# Patient Record
Sex: Male | Born: 1935 | Race: White | Hispanic: No | Marital: Married | Smoking: Never smoker
Health system: Southern US, Community
[De-identification: ages and names within clinical notes are randomized; demographics above are authoritative.]

## PROBLEM LIST (undated history)

## (undated) DIAGNOSIS — N200 Calculus of kidney: Secondary | ICD-10-CM

## (undated) DIAGNOSIS — Z9889 Other specified postprocedural states: Secondary | ICD-10-CM

## (undated) DIAGNOSIS — I10 Essential (primary) hypertension: Secondary | ICD-10-CM

## (undated) DIAGNOSIS — K219 Gastro-esophageal reflux disease without esophagitis: Secondary | ICD-10-CM

## (undated) DIAGNOSIS — E291 Testicular hypofunction: Secondary | ICD-10-CM

## (undated) DIAGNOSIS — R310 Gross hematuria: Secondary | ICD-10-CM

## (undated) DIAGNOSIS — C61 Malignant neoplasm of prostate: Secondary | ICD-10-CM

## (undated) DIAGNOSIS — R7303 Prediabetes: Secondary | ICD-10-CM

## (undated) DIAGNOSIS — J189 Pneumonia, unspecified organism: Secondary | ICD-10-CM

## (undated) DIAGNOSIS — R3129 Other microscopic hematuria: Secondary | ICD-10-CM

## (undated) DIAGNOSIS — Z8719 Personal history of other diseases of the digestive system: Secondary | ICD-10-CM

## (undated) DIAGNOSIS — R112 Nausea with vomiting, unspecified: Secondary | ICD-10-CM

## (undated) DIAGNOSIS — Z87442 Personal history of urinary calculi: Secondary | ICD-10-CM

## (undated) DIAGNOSIS — N400 Enlarged prostate without lower urinary tract symptoms: Secondary | ICD-10-CM

## (undated) DIAGNOSIS — R972 Elevated prostate specific antigen [PSA]: Secondary | ICD-10-CM

## (undated) DIAGNOSIS — N529 Male erectile dysfunction, unspecified: Secondary | ICD-10-CM

## (undated) HISTORY — DX: Testicular hypofunction: E29.1

## (undated) HISTORY — DX: Calculus of kidney: N20.0

## (undated) HISTORY — DX: Gross hematuria: R31.0

## (undated) HISTORY — PX: KIDNEY STONE SURGERY: SHX686

## (undated) HISTORY — DX: Malignant neoplasm of prostate: C61

## (undated) HISTORY — DX: Male erectile dysfunction, unspecified: N52.9

## (undated) HISTORY — DX: Elevated prostate specific antigen (PSA): R97.20

## (undated) HISTORY — DX: Benign prostatic hyperplasia without lower urinary tract symptoms: N40.0

## (undated) HISTORY — DX: Other microscopic hematuria: R31.29

## (undated) HISTORY — PX: PROSTATE SURGERY: SHX751

## (undated) HISTORY — DX: Gastro-esophageal reflux disease without esophagitis: K21.9

## (undated) HISTORY — PX: ORTHOPEDIC SURGERY: SHX850

## (undated) HISTORY — DX: Essential (primary) hypertension: I10

---

## 2003-12-20 HISTORY — PX: OTHER SURGICAL HISTORY: SHX169

## 2005-04-29 ENCOUNTER — Ambulatory Visit: Payer: Self-pay | Admitting: Gastroenterology

## 2006-12-11 ENCOUNTER — Ambulatory Visit: Payer: Self-pay | Admitting: Unknown Physician Specialty

## 2007-01-12 ENCOUNTER — Ambulatory Visit: Payer: Self-pay | Admitting: General Practice

## 2007-01-19 ENCOUNTER — Ambulatory Visit: Payer: Self-pay | Admitting: General Practice

## 2007-07-22 ENCOUNTER — Emergency Department: Payer: Self-pay

## 2007-08-10 ENCOUNTER — Ambulatory Visit: Payer: Self-pay | Admitting: Urology

## 2007-08-15 ENCOUNTER — Ambulatory Visit: Payer: Self-pay | Admitting: Urology

## 2007-08-22 ENCOUNTER — Ambulatory Visit: Payer: Self-pay | Admitting: Urology

## 2007-09-10 ENCOUNTER — Ambulatory Visit: Payer: Self-pay | Admitting: Urology

## 2007-09-13 ENCOUNTER — Ambulatory Visit: Payer: Self-pay | Admitting: Urology

## 2007-09-19 ENCOUNTER — Other Ambulatory Visit: Payer: Self-pay

## 2007-09-19 ENCOUNTER — Ambulatory Visit: Payer: Self-pay | Admitting: Urology

## 2007-09-20 ENCOUNTER — Ambulatory Visit: Payer: Self-pay | Admitting: Urology

## 2007-09-27 ENCOUNTER — Ambulatory Visit: Payer: Self-pay | Admitting: Urology

## 2007-10-04 ENCOUNTER — Ambulatory Visit: Payer: Self-pay | Admitting: Urology

## 2007-10-15 ENCOUNTER — Ambulatory Visit: Payer: Self-pay | Admitting: Urology

## 2007-10-24 ENCOUNTER — Ambulatory Visit: Payer: Self-pay | Admitting: Urology

## 2007-11-05 ENCOUNTER — Ambulatory Visit: Payer: Self-pay | Admitting: Urology

## 2008-03-05 ENCOUNTER — Ambulatory Visit: Payer: Self-pay | Admitting: Otolaryngology

## 2008-04-02 ENCOUNTER — Ambulatory Visit: Payer: Self-pay | Admitting: Urology

## 2012-09-06 ENCOUNTER — Ambulatory Visit: Payer: Self-pay | Admitting: Urology

## 2012-10-23 ENCOUNTER — Ambulatory Visit: Payer: Self-pay | Admitting: Urology

## 2013-01-01 ENCOUNTER — Ambulatory Visit: Payer: Self-pay | Admitting: Urology

## 2013-01-07 ENCOUNTER — Ambulatory Visit: Payer: Self-pay | Admitting: Urology

## 2014-02-19 ENCOUNTER — Ambulatory Visit: Payer: Self-pay | Admitting: Urology

## 2014-09-22 ENCOUNTER — Ambulatory Visit: Payer: Self-pay | Admitting: Urology

## 2014-10-02 DIAGNOSIS — I10 Essential (primary) hypertension: Secondary | ICD-10-CM | POA: Insufficient documentation

## 2014-10-02 DIAGNOSIS — E785 Hyperlipidemia, unspecified: Secondary | ICD-10-CM | POA: Insufficient documentation

## 2014-10-02 HISTORY — DX: Essential (primary) hypertension: I10

## 2014-11-20 ENCOUNTER — Ambulatory Visit: Payer: Self-pay | Admitting: Radiation Oncology

## 2014-12-17 LAB — PSA: PSA: 5.1 ng/mL — AB (ref 0.0–4.0)

## 2014-12-19 ENCOUNTER — Ambulatory Visit: Payer: Self-pay | Admitting: Radiation Oncology

## 2014-12-19 ENCOUNTER — Ambulatory Visit: Payer: Self-pay | Admitting: Oncology

## 2014-12-19 DIAGNOSIS — N200 Calculus of kidney: Secondary | ICD-10-CM

## 2014-12-19 HISTORY — DX: Calculus of kidney: N20.0

## 2014-12-19 HISTORY — PX: OTHER SURGICAL HISTORY: SHX169

## 2015-01-19 ENCOUNTER — Ambulatory Visit: Payer: Self-pay | Admitting: Radiation Oncology

## 2015-01-19 LAB — HEMOGLOBIN: HGB: 12.7 g/dL — ABNORMAL LOW (ref 13.0–18.0)

## 2015-01-26 ENCOUNTER — Ambulatory Visit: Payer: Self-pay | Admitting: Urology

## 2015-01-30 ENCOUNTER — Ambulatory Visit: Payer: Self-pay | Admitting: Radiation Oncology

## 2015-02-17 ENCOUNTER — Ambulatory Visit
Admit: 2015-02-17 | Disposition: A | Discharge status: Home / Self Care | Payer: Self-pay | Attending: Radiation Oncology | Admitting: Radiation Oncology

## 2015-03-16 LAB — CBC CANCER CENTER
Basophil #: 0 x10 3/mm (ref 0.0–0.1)
Basophil %: 0.8 %
EOS PCT: 5.8 %
Eosinophil #: 0.3 x10 3/mm (ref 0.0–0.7)
HCT: 33.7 % — ABNORMAL LOW (ref 40.0–52.0)
HGB: 12.2 g/dL — ABNORMAL LOW (ref 13.0–18.0)
Lymphocyte #: 1.2 x10 3/mm (ref 1.0–3.6)
Lymphocyte %: 28.3 %
MCH: 31.1 pg (ref 26.0–34.0)
MCHC: 36.2 g/dL — ABNORMAL HIGH (ref 32.0–36.0)
MCV: 86 fL (ref 80–100)
MONOS PCT: 8.2 %
Monocyte #: 0.4 x10 3/mm (ref 0.2–1.0)
Neutrophil #: 2.4 x10 3/mm (ref 1.4–6.5)
Neutrophil %: 56.9 %
Platelet: 199 x10 3/mm (ref 150–440)
RBC: 3.91 10*6/uL — ABNORMAL LOW (ref 4.40–5.90)
RDW: 12.6 % (ref 11.5–14.5)
WBC: 4.3 x10 3/mm (ref 3.8–10.6)

## 2015-03-16 LAB — COMPREHENSIVE METABOLIC PANEL
ALBUMIN: 4 g/dL
ANION GAP: 7 (ref 7–16)
Alkaline Phosphatase: 77 U/L
BUN: 28 mg/dL — AB
Bilirubin,Total: 0.5 mg/dL
CALCIUM: 8.9 mg/dL
CHLORIDE: 105 mmol/L
Co2: 23 mmol/L
Creatinine: 1.21 mg/dL
EGFR (African American): >60
EGFR (Non-African Amer.): 58 — ABNORMAL LOW
Glucose: 184 mg/dL — ABNORMAL HIGH
Potassium: 4 mmol/L
SGOT(AST): 22 U/L
SGPT (ALT): 52 U/L
Sodium: 135 mmol/L
TOTAL PROTEIN: 6.6 g/dL

## 2015-03-17 LAB — PSA: PSA: 0.2 ng/mL (ref 0.0–4.0)

## 2015-03-20 ENCOUNTER — Ambulatory Visit
Admit: 2015-03-20 | Disposition: A | Discharge status: Home / Self Care | Payer: Self-pay | Attending: Radiation Oncology | Admitting: Radiation Oncology

## 2015-04-10 DIAGNOSIS — D649 Anemia, unspecified: Secondary | ICD-10-CM | POA: Insufficient documentation

## 2015-04-10 DIAGNOSIS — R739 Hyperglycemia, unspecified: Secondary | ICD-10-CM | POA: Insufficient documentation

## 2015-04-10 DIAGNOSIS — A809 Acute poliomyelitis, unspecified: Secondary | ICD-10-CM | POA: Insufficient documentation

## 2015-04-10 NOTE — Op Note (Signed)
PATIENT NAME:  Alex Lang, Alex Lang MR#:  [id number] DATE OF BIRTH:  August 19, 1936  DATE OF PROCEDURE:  01/07/2013  SURGEON:  Rick Duff, MD  PREOPERATIVE DIAGNOSIS:  Painless gross hematuria.  POSTOPERATIVE DIAGNOSIS:  Painless gross hematuria, secondary to inflammatory changes of the bladder neck and prostatic urethra with no bladder tumor seen.  PROCEDURE:  Cystourethroscopy.  ANESTHESIA:  General.  DESCRIPTION OF PROCEDURE:  With the patient sterilely prepped and draped in the supine lithotomy position, (Dictation Anomaly) for ease of approach to t the external genitalia, I do a cystoscopy utilizing a 22-French sheath with a 30 degree lens.  The urethra and bladder and prostate are seen easily.  No tumors, masses or growths are seen in the bladder itself.  I view the bladder with both the 30 degree lens and the 90 degree lens.  The prostatic fossa has some inflammatory changes that are not tumorous in nature.  The prostate is slightly enlarged with lateral lobe hypertrophy and slight bladder neck hypertrophy.  The prostate itself does not appear to be obstructed in nature, nor is the bladder neck. There is slight trabeculation in the bladder with no (Dictation Anomaly) diverticuli diverticula seen.  The trabeculation is about a grade II/IV trabeculation.  The ureter is in a normal position with a clear efflux of urine coming from each orifice.  No calculi or tumors or masses are seen in the bladder itself.  The only area that could have caused the bleeding is right before the bladder neck in the prostatic urethra and fossa itself.  Therefore, no resection or biopsy is done, as I saw no masses that were worthy of biopsy.  At the end of the procedure, the bladder is emptied and 30 mL of 0.5% Sensorcaine without epinephrine is placed into the bladder.  The sheath is then withdrawn.  Beano suppository is done.  On rectal exam, a small prostate, nonsuspicious in nature, is felt without nodule, mass or  growth.  The patient is sent to recovery in satisfactory condition.   ____________________________ Janice Coffin. Elnoria Howard, DO rdh:dm D: 01/07/2013 09:35:22 ET T: 01/07/2013 09:38:31 ET JOB#: [id number]  cc: Janice Coffin. Elnoria Howard, DO, <Dictator> Samyria Rudie D Enriqueta Augusta DO ELECTRONICALLY SIGNED 02/08/2013 15:41

## 2015-04-11 NOTE — Consult Note (Signed)
Reason for Visit: This 79 year old Male patient presents to the clinic for initial evaluation of  prostate cancer .   Referred by Dr. Erlene Quan.  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  Pathology Report pathology report reviewed   Imaging Report bone scan not indicat   Referral Report clinical notes reviewed   Planned Treatment Regimen I-125 interstitial implant   HPI   patient is a 79 year old male with history of hypogonadism previously on testosterone therapy noted to have a rising PSA September 2015 up to 4.8 which had risen over the past 6 months from 3.5. He underwent transrectal ultrasound-guided biopsy showing mostly Gleason 6 (3+3) confined to the left lateral lobe. One core biopsy from the right lobe was also positive. There was also one core positive for Gleason 7 (3+4). Patient on digital rectal exam did have some induration of the left lateral lobe consistent with a T2A lesion.patient has been stopped from his testosterone therapy. Does have baseline erectile dysfunction but uses Viagra. He denies frequency and urgency of urination. No bowel problems. 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  Past Hx:    polio:    kidney stones:    right leg surgery:    left knee surgery:   Past, Family and Social History:  Past Medical History positive   Genitourinary kidney stones; BPH, erectile dysfunction   Past Surgical History bilateral knee surgeries   Past Medical History Comments childhood polio   Family History noncontributory   Social History noncontributory   Additional Past Medical and Surgical History accompanied by his wife today   Allergies:   Darvon-N: N/V, Other  Darvocet - N: N/V  Home Meds:  Home Medications: Medication Instructions Status  Vitamin D3 2000 intl  units oral capsule 1 cap(s) orally once a day (in the morning) Active  Centrum Silver 1 cap(s) orally once a day (in the morning) Active  Fish Oil 1200 mg oral capsule 1 cap(s) orally once a day (in the morning) Active  ibuprofen 200 mg oral capsule 1 cap(s) orally every 4 hours, As Needed Active  Percocet 5/325 oral tablet 1 tab(s) orally every 6 hours, As Needed- for Pain  Active  Llysine 500mg  1 tab(s) orally 2 times a day Active  omeprazole 20 mg oral delayed release capsule 1 cap(s) orally 2 times a day Active  potassium citrate 10 mEq oral tablet, extended release 1 tab(s) orally 3 times a day Active  benazepril 5 mg oral tablet 1 tab(s) orally 2 times a day Active   Review of Systems:  General negative   Performance Status (ECOG) 0   Skin negative   Breast negative   Ophthalmologic negative   ENMT negative   Respiratory and Thorax negative   Cardiovascular negative   Gastrointestinal negative   Genitourinary see HPI   Musculoskeletal see HPI   Neurological negative   Psychiatric negative   Hematology/Lymphatics negative   Endocrine negative   Allergic/Immunologic negative   Review of Systems   denies any weight loss, fatigue, weakness, fever, chills or night sweats. Patient denies any loss of vision, blurred vision. Patient denies any ringing  of the ears or hearing loss. No irregular heartbeat. Patient denies heart murmur or history of fainting. Patient denies any chest pain or pain radiating to her upper extremities. Patient denies any  shortness of breath, difficulty breathing at night, cough or hemoptysis. Patient denies any swelling in the lower legs. Patient denies any nausea vomiting, vomiting of blood, or coffee ground material in the vomitus. Patient denies any stomach pain. Patient states has had normal bowel movements no significant constipation or diarrhea. Patient denies any dysuria, hematuria or significant nocturia. Patient denies any problems  walking, swelling in the joints or loss of balance. Patient denies any skin changes, loss of hair or loss of weight. Patient denies any excessive worrying or anxiety or significant depression. Patient denies any problems with insomnia. Patient denies excessive thirst, polyuria, polydipsia. Patient denies any swollen glands, patient denies easy bruising or easy bleeding. Patient denies any recent infections, allergies or URI. Patient "s visual fields have not changed significantly in recent time.   Nursing Notes:  Nursing Vital Signs and Chemo Nursing Nursing Notes: *CC Vital Signs Flowsheet:   03-Dec-15 11:37  Temp Temperature 97.5  Pulse Pulse 56  SBP SBP 146  DBP DBP 68  Current Weight (kg) (kg) 88.7  Height (cm) centimeters 168  BSA (m2) 1.9   Physical Exam:  General/Skin/HEENT:  General normal   Skin normal   Eyes normal   ENMT normal   Head and Neck normal   Additional PE well-developed male in NAD. He does wear bilateral leg braces secondary to his childhood polio. Lungs are clear to A&P cardiac examination shows regular rate and rhythm abdomen is benign. On rectal exam rectal sphincter tone is good there is induration in the superior part of the left lateral lobe of the prostate. Sulcus is preserved. No other rectal abnormali   Breasts/Resp/CV/GI/GU:  Respiratory and Thorax normal   Cardiovascular normal   Gastrointestinal normal   Genitourinary normal   MS/Neuro/Psych/Lymph:  Lymphatics normal   Physical Exam patient has neurologic sequela are early childhood polio   Assessment and Plan: Impression:   stage II a Gleason 6 adenocarcinoma prostate presenting with a PSA of 12.24 and 79 year old male. Plan:   the stomach on over treatment options with the patient. Patient is favoring radiation therapy and I discussed both external beam as well as I-125 interstitial implant. Patient would be a good candidate for implant and I discussed the risks and benefits of  treatment including volume study as well as the operative procedure itself. Risks and benefits including being radioactive for about 2 months, increased urinary urgency and frequency and nocturia during that treatment time, possible diarrhea possible fatigue and risks and benefits associated with general anesthesia. Patient is wife seem to copying my treatment plan well and would like [place] to proceed with scheduling volume study as well as seed implant. I will plan on delivering 145 gray to his prostatic volume based on the volume study.  I would like to take this opportunity for allowing me to participate in the care of your patient..  Fax to Physician:  Physicians To Recieve Fax: Madelyn Brunner, MD - [phone] Sherlynn Stalls - [phone].  Electronic Signatures: Maritssa Haughton, Roda Shutters (MD)  (Signed 03-Dec-15 14:07)  Authored: HPI, Diagnosis, Past Hx, PFSH, Allergies, Home Meds, ROS, Nursing Notes, Physical Exam, Encounter Assessment and Plan, Fax to Physician   Last Updated: 03-Dec-15 14:07 by Armstead Peaks (MD)

## 2015-04-19 NOTE — Op Note (Signed)
PATIENT NAME:  Alex Lang, Alex Lang MR#:  [id number] DATE OF BIRTH:  Nov 05, 1936  DATE OF PROCEDURE:  01/26/2015  PREOPERATIVE DIAGNOSIS: Prostate cancer.   POSTOPERATIVE DIAGNOSIS: Prostate cancer.   PROCEDURE PERFORMED: Implantation of I-125 prostatic brachytherapy seeds, cystoscopy.   ATTENDING SURGEON: Sherlynn Stalls, MD  ASSISTANT: Noreene Filbert, MD   ANESTHESIA: General.  ESTIMATED BLOOD LOSS: 25 mL.   DRAINS: None.   COMPLICATIONS: None.   SPECIMENS: None.   INDICATION: This is a 79 year old male who was diagnosed with Gleason 3 + 3 and 3 + 4 prostate cancer. His PSA was elevated to 4.8 and there was a nodule palpable, stage T2 disease. He was counseled on his various treatment options and has elected to undergo implantation of brachytherapy seeds. Previous volume study had been performed and he presents today for implantation of brachytherapy seeds. Risks and benefits of the procedure were explained in detail. The patient agreed to proceed as planned.   DESCRIPTION OF PROCEDURE: The patient was correctly identified in the preoperative holding area and informed consent was confirmed. He was brought to the operating suite and placed on the table in the supine position. At this time, universal timeout protocol was performed. All team members were identified. Venodyne boots were placed, and the patient was administered 1 gram of IV Ancef in the perioperative period. He was then placed under general anesthesia and repositioned in the dorsal lithotomy position. His perineum was shaved and his scrotum was retracted using a sticky drape. He was then prepped and draped in the standard surgical fashion. The brachytherapy grid was then brought in after a 16-French Foley catheter was placed sterilely without difficulty into the bladder and the bladder was drained. At this point in time, the prostate could be visualized using the rectal ultrasound probe. Using the predetermined needle  locations, a total of  65 brachytherapy seeds were placed using 25 needles in various locations. Both transverse and sagittal images were used to insure that none of the seeds were not placed within the prostatic urethra or within the bladder. Once all the seeds had been placed in their optimal position, the Foley catheter was removed and the patient was scoped using a 22-French rigid cystoscope. The prostatic urethra appeared normal and there was no evidence of brachytherapy seeds within the prostatic urethra. The bladder was then surveyed and there was no evidence of bladder bleeding or seeds within the bladder. The bladder was then drained. An x-ray was then obtained and all the brachytherapy seeds were accounted for within the prostatic fossa. The patient was then repositioned in the supine position, reversed from anesthesia and taken to the PACU in stable condition. He was able to void spontaneously prior to discharge without any difficulty.   PLAN: I will see the patient back in 1 month for follow-up.  ____________________________ Sherlynn Stalls, MD ajb:sb D: 01/26/2015 09:39:15 ET T: 01/26/2015 09:49:22 ET JOB#: [id number]  cc: Sherlynn Stalls, MD, <Dictator> Sherlynn Stalls MD ELECTRONICALLY SIGNED 01/26/2015 17:44

## 2015-04-19 NOTE — Op Note (Signed)
PATIENT NAME:  Alex Lang, Alex Lang MR#:  [id number] DATE OF BIRTH:  08/30/1936  DATE OF PROCEDURE:  12/29/2014  PREOPERATIVE DIAGNOSIS: Prostate cancer.   POSTOPERATIVE DIAGNOSIS: Prostate cancer.  PROCEDURE PERFORMED: Volume study for planned brachytherapy.  ATTENDING SURGEON: Hollice Espy, M.D.   RADIATION ONCOLOGIST: Noreene Filbert, MD   ANESTHESIA: None.  DRAINS: None.   COMPLICATIONS: None.   INDICATION: This is a 79 year old male who has been recently diagnosed with prostate cancer and has elected to undergo placement of brachytherapy seeds for management of his localized disease. He presents today for A volume study.   DESCRIPTION OF PROCEDURE: The patient was correctly identified and brought to the operating room. He was placed on the table in the supine position. His identity was reconfirmed. At this point in time, under sterile technique, I placed a 16-French Foley catheter without difficulty with drainage of clear yellow urine. A rectal ultrasound probe was then placed and serial cross-sectional images, approximately 5 mm in each slice, were obtained and the borders of the prostate were visualized and outlined with each sequential frame. These were all seen through the computer system as well as printed for planning purposes. The rectal probe was then removed and tolerated the procedure quite well. The Foley catheter was then removed, and the patient was transferred back to the stretcher and taken to the [place].  ____________________________ Sherlynn Stalls, MD ajb:sb D: 12/29/2014 10:21:00 ET T: 12/29/2014 10:40:40 ET JOB#: [id number]  cc: Sherlynn Stalls, MD, <Dictator> Sherlynn Stalls MD ELECTRONICALLY SIGNED 12/31/2014 15:53

## 2015-05-27 ENCOUNTER — Other Ambulatory Visit: Payer: Self-pay | Admitting: Orthopedic Surgery

## 2015-05-27 DIAGNOSIS — R2231 Localized swelling, mass and lump, right upper limb: Secondary | ICD-10-CM

## 2015-06-05 ENCOUNTER — Ambulatory Visit
Admission: RE | Admit: 2015-06-05 | Discharge: 2015-06-05 | Disposition: A | Discharge status: Home / Self Care | Payer: Medicare Other | Source: Ambulatory Visit | Attending: Orthopedic Surgery | Admitting: Orthopedic Surgery

## 2015-06-05 DIAGNOSIS — R2231 Localized swelling, mass and lump, right upper limb: Secondary | ICD-10-CM | POA: Diagnosis present

## 2015-06-18 ENCOUNTER — Ambulatory Visit (INDEPENDENT_AMBULATORY_CARE_PROVIDER_SITE_OTHER): Payer: Medicare Other | Admitting: Urology

## 2015-06-18 ENCOUNTER — Ambulatory Visit
Admission: RE | Admit: 2015-06-18 | Discharge: 2015-06-18 | Disposition: A | Discharge status: Home / Self Care | Payer: Medicare Other | Source: Ambulatory Visit | Attending: Urology | Admitting: Urology

## 2015-06-18 ENCOUNTER — Encounter: Payer: Self-pay | Admitting: Urology

## 2015-06-18 VITALS — BP 168/71 | HR 53 | Ht 69.0 in | Wt 199.5 lb

## 2015-06-18 DIAGNOSIS — C61 Malignant neoplasm of prostate: Secondary | ICD-10-CM

## 2015-06-18 DIAGNOSIS — N200 Calculus of kidney: Secondary | ICD-10-CM

## 2015-06-18 DIAGNOSIS — R109 Unspecified abdominal pain: Secondary | ICD-10-CM | POA: Diagnosis not present

## 2015-06-18 NOTE — Progress Notes (Signed)
06/18/2015 2:45 PM   Alex Lang 1936/10/05 [id number]  Referring provider: No referring provider defined for this encounter.  Chief Complaint  Patient presents with  . Flank Pain    HPI: Alex Lang is a 80 year old white male with a history of nephrolithiasis who has been experiencing right flank pain for the last four days.  He states the pain has been quite constant since its onset. He describes it as a dull ache. His wife states he has been quite irritable and that is out of character for him. The pain is independent of movement or time of day. He does not complain of fevers, chills, nausea, vomiting or blood in the urine.    A CT scan performed in 2013 noted bilateral nephrolithiasis.  Patient also has  cT2a Prostate cancer- Dx in 10/2014 with Gleason 3+4, 3+3 prostate cancer involving 8 of 13 cores, iPSA 4.8 DRE also somewhat concerning for cancer with questionable induration at right base. TRUS vol 45 cc.  He underwent I-125 brachytherapy seed placement on 01/26/2015 by Dr. Hollice Espy. He is also receiving adjunctive ADT therapy for 4-6 months.  He is currently being followed by Dr. Donella Stade and will see seeing him soon for an office visit where they will obtain a PSA.  PMH: Past Medical History  Diagnosis Date  . Kidney stone   . Prostate cancer   . Hypertension   . Acid reflux     Surgical History: Past Surgical History  Procedure Laterality Date  . Prostate surgery    . Kidney stone surgery      Home Medications:    Medication List       This list is accurate as of: 06/18/15  2:45 PM.  Always use your most recent med list.               benazepril 5 MG tablet  Commonly known as:  LOTENSIN  Take by mouth.     CALCIUM 600 600 MG Tabs tablet  Generic drug:  calcium carbonate  Take by mouth.     FISH OIL + D3 PO  Take by mouth.     Ginkgo Biloba 40 MG Tabs  Take by mouth.     Lysine 500 MG Caps  Take by mouth.     omeprazole 20  MG capsule  Commonly known as:  PRILOSEC  Take by mouth.     tamsulosin 0.4 MG Caps capsule  Commonly known as:  FLOMAX  Take by mouth.     Vitamin D3 2000 UNITS capsule  Take by mouth.        Allergies:  Allergies  Allergen Reactions  . Other Nausea And Vomiting    Family History: Family History  Problem Relation Age of Onset  . Hypertension Mother   . Cancer Maternal Grandfather   . Cancer Brother   . Prostate cancer Brother     Social History:  reports that he has never smoked. He does not have any smokeless tobacco history on file. He reports that he does not drink alcohol or use illicit drugs.  ROS: Urological Symptom Review  Patient is experiencing the following symptoms: Flank pain   Review of Systems  Gastrointestinal (upper)  : Negative for upper GI symptoms  Gastrointestinal (lower) : Negative for lower GI symptoms  Constitutional : Negative for symptoms  Skin: Negative for skin symptoms  Eyes: Negative for eye symptoms  Ear/Nose/Throat : Negative for Ear/Nose/Throat symptoms  Hematologic/Lymphatic: Negative for Hematologic/Lymphatic  symptoms  Cardiovascular : Negative for cardiovascular symptoms  Respiratory : Negative for respiratory symptoms  Endocrine: Negative for endocrine symptoms  Musculoskeletal: Negative for musculoskeletal symptoms  Neurological: Negative for neurological symptoms  Psychologic: Negative for psychiatric symptoms   Physical Exam: BP 168/71 mmHg  Pulse 53  Ht 5\' 9"  (1.753 m)  Wt 199 lb 8 oz (90.493 kg)  BMI 29.45 kg/m2  Constitutional:  Alert and oriented, No acute distress. HEENT: [place] AT, moist mucus membranes.  Trachea midline, no masses. Cardiovascular: No clubbing, cyanosis, or edema. Respiratory: Normal respiratory effort, no increased work of breathing. GI: Abdomen is soft, nontender, nondistended, no abdominal masses GU: Right CVA tenderness.  Skin: No rashes, bruises or suspicious  lesions. Lymph: No cervical or inguinal adenopathy. Neurologic: Grossly intact, no focal deficits, moving all 4 extremities. Psychiatric: Normal mood and affect.  Laboratory Data: Results for orders placed or performed in visit on 06/18/15  Microscopic Examination  Result Value Ref Range   WBC, UA 0-5 0 -  5 /hpf   RBC, UA 11-30 (A) 0 -  2 /hpf   Epithelial Cells (non renal) 0-10 0 - 10 /hpf   Bacteria, UA None seen None seen/Few  Urinalysis, Complete  Result Value Ref Range   Specific Gravity, UA 1.025 1.005 - 1.030   pH, UA 5.0 5.0 - 7.5   Color, UA Yellow Yellow   Appearance Ur Clear Clear   Leukocytes, UA Negative Negative   Protein, UA Negative Negative/Trace   Glucose, UA Negative Negative   Ketones, UA Negative Negative   RBC, UA 3+ (A) Negative   Bilirubin, UA Negative Negative   Urobilinogen, Ur 0.2 0.2 - 1.0 mg/dL   Nitrite, UA Negative Negative   Microscopic Examination See below:    Lab Results  Component Value Date   WBC 4.3 03/16/2015   HGB 12.2* 03/16/2015   HCT 33.7* 03/16/2015   MCV 86 03/16/2015   PLT 199 03/16/2015    Lab Results  Component Value Date   CREATININE 1.21 03/16/2015    Lab Results  Component Value Date   PSA 0.2 03/16/2015   PSA 5.1* 12/15/2014    No results found for: TESTOSTERONE  No results found for: HGBA1C  Urinalysis No results found for: COLORURINE, APPEARANCEUR, LABSPEC, PHURINE, GLUCOSEU, HGBUR, BILIRUBINUR, KETONESUR, PROTEINUR, UROBILINOGEN, NITRITE, LEUKOCYTESUR  Pertinent Imaging: CLINICAL DATA: Right back pain, history of renal calculi  EXAM: ABDOMEN - 1 VIEW  COMPARISON: 09/23/2015  FINDINGS: Bilateral renal calculi are reidentified. Normal bowel-gas pattern. No free air. Prostatic brachytherapy seeds. Moderate lumbar spine disc degenerative change and rightward curvature centered at L3 reidentified.  IMPRESSION: No significant change in bilateral renal calculi.   Electronically Signed   By: Conchita Paris M.D.  On: 06/18/2015 16:21  Assessment & Plan:    1. Kidney stones:  Patient has known bilateral nephrolithiasis recommended by CT.  He has not passed any fragments.  We'll obtain a KUB to evaluate the current status of the bilateral nephrolithiasis.    - Urinalysis, Complete  2. Right flank pain:   Patient his been experiencing right-sided flank pain for the past 4 days. Nonobstructing stones are usually not responsible for flank pain. We will obtain a KUB to ascertain whether or not the right-sided stone has moved into the ureter.  KUB did not demonstrate an ureteral stone.  We will obtain a non contrast CT for further evaluation of his flank pain.    3. Prostate cancer:   cT2a Prostate cancer- Dx  in 10/2014 with Gleason 3+4, 3+3 prostate cancer involving 8 of 13 cores, iPSA 4.8 DRE also somewhat concerning for cancer with questionable induration at right base. TRUS vol 45 cc.  He underwent I-125 brachytherapy seed placement on 01/26/2015 by Dr. Hollice Espy. He is also receiving adjunctive ADT therapy for 4-6 months.  He is currently being followed by Cancer center and will see seeing him in July for an office visit where they will obtain a PSA.    No Follow-up on file. ; [facility], [facility] [address] [place], [place] [address] [phone]

## 2015-06-19 ENCOUNTER — Other Ambulatory Visit: Payer: Self-pay | Admitting: *Deleted

## 2015-06-19 DIAGNOSIS — C61 Malignant neoplasm of prostate: Secondary | ICD-10-CM

## 2015-06-19 LAB — MICROSCOPIC EXAMINATION: Bacteria, UA: NONE SEEN

## 2015-06-19 LAB — URINALYSIS, COMPLETE
BILIRUBIN UA: NEGATIVE
GLUCOSE, UA: NEGATIVE
KETONES UA: NEGATIVE
Leukocytes, UA: NEGATIVE
Nitrite, UA: NEGATIVE
PROTEIN UA: NEGATIVE
Specific Gravity, UA: 1.025 (ref 1.005–1.030)
Urobilinogen, Ur: 0.2 mg/dL (ref 0.2–1.0)
pH, UA: 5 (ref 5.0–7.5)

## 2015-06-21 DIAGNOSIS — N200 Calculus of kidney: Secondary | ICD-10-CM

## 2015-06-21 DIAGNOSIS — R109 Unspecified abdominal pain: Secondary | ICD-10-CM | POA: Insufficient documentation

## 2015-06-21 DIAGNOSIS — C61 Malignant neoplasm of prostate: Secondary | ICD-10-CM | POA: Insufficient documentation

## 2015-06-21 HISTORY — DX: Calculus of kidney: N20.0

## 2015-06-23 ENCOUNTER — Inpatient Hospital Stay (HOSPITAL_BASED_OUTPATIENT_CLINIC_OR_DEPARTMENT_OTHER): Payer: Medicare Other | Admitting: Oncology

## 2015-06-23 ENCOUNTER — Encounter: Payer: Self-pay | Admitting: Oncology

## 2015-06-23 ENCOUNTER — Inpatient Hospital Stay: Payer: Medicare Other | Attending: Oncology

## 2015-06-23 VITALS — BP 175/88 | HR 51 | Temp 97.2°F | Wt 207.2 lb

## 2015-06-23 DIAGNOSIS — Z87442 Personal history of urinary calculi: Secondary | ICD-10-CM | POA: Diagnosis not present

## 2015-06-23 DIAGNOSIS — K219 Gastro-esophageal reflux disease without esophagitis: Secondary | ICD-10-CM | POA: Insufficient documentation

## 2015-06-23 DIAGNOSIS — R5383 Other fatigue: Secondary | ICD-10-CM | POA: Insufficient documentation

## 2015-06-23 DIAGNOSIS — M7591 Shoulder lesion, unspecified, right shoulder: Secondary | ICD-10-CM | POA: Diagnosis not present

## 2015-06-23 DIAGNOSIS — I10 Essential (primary) hypertension: Secondary | ICD-10-CM

## 2015-06-23 DIAGNOSIS — Z923 Personal history of irradiation: Secondary | ICD-10-CM

## 2015-06-23 DIAGNOSIS — M5136 Other intervertebral disc degeneration, lumbar region: Secondary | ICD-10-CM | POA: Diagnosis not present

## 2015-06-23 DIAGNOSIS — M1990 Unspecified osteoarthritis, unspecified site: Secondary | ICD-10-CM

## 2015-06-23 DIAGNOSIS — R531 Weakness: Secondary | ICD-10-CM | POA: Diagnosis not present

## 2015-06-23 DIAGNOSIS — Z79899 Other long term (current) drug therapy: Secondary | ICD-10-CM | POA: Diagnosis not present

## 2015-06-23 DIAGNOSIS — C61 Malignant neoplasm of prostate: Secondary | ICD-10-CM

## 2015-06-23 DIAGNOSIS — M25511 Pain in right shoulder: Secondary | ICD-10-CM

## 2015-06-23 LAB — CBC WITH DIFFERENTIAL/PLATELET
BASOS PCT: 1 %
Basophils Absolute: 0 10*3/uL (ref 0–0.1)
EOS ABS: 0.2 10*3/uL (ref 0–0.7)
EOS PCT: 7 %
HCT: 33.8 % — ABNORMAL LOW (ref 40.0–52.0)
Hemoglobin: 11.8 g/dL — ABNORMAL LOW (ref 13.0–18.0)
Lymphocytes Relative: 26 %
Lymphs Abs: 0.9 10*3/uL — ABNORMAL LOW (ref 1.0–3.6)
MCH: 30.1 pg (ref 26.0–34.0)
MCHC: 35 g/dL (ref 32.0–36.0)
MCV: 85.8 fL (ref 80.0–100.0)
MONO ABS: 0.4 10*3/uL (ref 0.2–1.0)
MONOS PCT: 12 %
Neutro Abs: 2 10*3/uL (ref 1.4–6.5)
Neutrophils Relative %: 54 %
Platelets: 173 10*3/uL (ref 150–440)
RBC: 3.93 MIL/uL — ABNORMAL LOW (ref 4.40–5.90)
RDW: 12.7 % (ref 11.5–14.5)
WBC: 3.6 10*3/uL — ABNORMAL LOW (ref 3.8–10.6)

## 2015-06-23 LAB — COMPREHENSIVE METABOLIC PANEL
ALK PHOS: 70 U/L (ref 38–126)
ALT: 55 U/L (ref 17–63)
AST: 22 U/L (ref 15–41)
Albumin: 4 g/dL (ref 3.5–5.0)
Anion gap: 5 (ref 5–15)
BUN: 29 mg/dL — ABNORMAL HIGH (ref 6–20)
CO2: 26 mmol/L (ref 22–32)
Calcium: 8.7 mg/dL — ABNORMAL LOW (ref 8.9–10.3)
Chloride: 103 mmol/L (ref 101–111)
Creatinine, Ser: 1.25 mg/dL — ABNORMAL HIGH (ref 0.61–1.24)
GFR, EST NON AFRICAN AMERICAN: 54 mL/min — AB (ref >60)
GLUCOSE: 132 mg/dL — AB (ref 65–99)
POTASSIUM: 4.8 mmol/L (ref 3.5–5.1)
SODIUM: 134 mmol/L — AB (ref 135–145)
Total Bilirubin: 0.6 mg/dL (ref 0.3–1.2)
Total Protein: 6.3 g/dL — ABNORMAL LOW (ref 6.5–8.1)

## 2015-06-23 LAB — PSA: PSA: 0.04 ng/mL (ref 0.00–4.00)

## 2015-06-23 NOTE — Progress Notes (Signed)
Patient does have living will.  Never smoked. Patient c/o hot flashes after initial Lupron injection.

## 2015-06-23 NOTE — Progress Notes (Signed)
[place] @ [facility] Telephone:([phone]  Fax:(336) [place]: [birth year]  MR#: [id number]  OFB#:[id number]  Patient Care Team: Provider Not In System as PCP - General  CHIEF COMPLAINT:  Chief Complaint  Patient presents with  . Follow-up    Oncology History   Chief Complaint/Diagnosis:   1.carcinoma prostateClinically staged   as T2A  NO MO . Gleason score 4+3.  8 out of 13    core  positive.  PSA is 4.8 According to NCCN  guidelines intermediate risk get diagnosis in December of 2015. 2.  External beam radiation radioactive seeds has been planned (January, 2015 3, androgen deprivation therapy starting from December of 2015 with Lupron  for duration 04-6 mont 4. patient finished radiation therapy on feb  8 th, 2016 5.  Patient received last dose of Lupron therapy in march of 2016 with PSA dropping to 0.2     Prostate cancer   06/21/2015 Initial Diagnosis Prostate cancer   79 year old gentleman with recurrent carcinoma prostate INTERVAL HISTORY: 41 79 year old man came today further follow-up since last evaluation patient had right shoulder pain MRI scan was done and was found to have osteoarthritis and ligament tear.  Was seen by orthopedic surgeon.  Gradually pain is getting better.  Recently patient also had a renal stones so was advised to stop calcium.  Here for further evaluation and treatment consideration and received total 6 month duration of anti-hormonal therapy.  No bony pains.  REVIEW OF SYSTEMS:    general status: Patient is feeling weak and tired.  No change in a performance status.  No chills.  No fever. HEENT  No evidence of stomatitis Lungs: No cough or shortness of breath Cardiac: No chest pain or paroxysmal nocturnal dyspnea GI: No nausea no vomiting no diarrhea no abdominal pain Skin: No rash Lower extremity no swelling Neurological system: No tingling.  No numbness.  No other focal signs Musculoskeletal system no bony pains.   Right shoulder pain now gradually getting better had MRI scan done GU: No hematuria.  No dysuria. Hot flashes are getting better  As per HPI. Otherwise, a complete review of systems is negatve.  PAST MEDICAL HISTORY: Past Medical History  Diagnosis Date  . Kidney stone   . Prostate cancer   . Hypertension   . Acid reflux     PAST SURGICAL HISTORY: Past Surgical History  Procedure Laterality Date  . Prostate surgery    . Kidney stone surgery      FAMILY HISTORY Family History  Problem Relation Age of Onset  . Hypertension Mother   . Cancer Maternal Grandfather   . Cancer Brother   . Prostate cancer Brother     ADVANCED DIRECTIVES:  Patient does have a living will  HEALTH MAINTENANCE: History  Substance Use Topics  . Smoking status: Never Smoker   . Smokeless tobacco: Not on file  . Alcohol Use: No      Allergies  Allergen Reactions  . Other Nausea And Vomiting    Current Outpatient Prescriptions  Medication Sig Dispense Refill  . benazepril (LOTENSIN) 5 MG tablet Take by mouth.    . calcium carbonate (CALCIUM 600) 600 MG TABS tablet Take by mouth.    . Cholecalciferol (VITAMIN D3) 2000 UNITS capsule Take by mouth.    . Fish Oil-Cholecalciferol (FISH OIL + D3 PO) Take by mouth.    . Ginkgo Biloba 40 MG TABS Take by mouth.    Marland Kitchen  Lysine 500 MG CAPS Take by mouth.    Marland Kitchen omeprazole (PRILOSEC) 20 MG capsule Take by mouth.    . tamsulosin (FLOMAX) 0.4 MG CAPS capsule Take by mouth.     No current facility-administered medications for this visit.    OBJECTIVE:  Filed Vitals:   06/23/15 0932  BP: 175/88  Pulse: 51  Temp: 97.2 F (36.2 C)     Body mass index is 30.59 kg/(m^2).    ECOG FS:1 - Symptomatic but completely ambulatory  PHYSICAL EXAM: GENERAL:  Well developed, well nourished, sitting comfortably in the exam room in no acute distress. MENTAL STATUS:  Alert and oriented to person, place and time. HEAD:  Normocephalic, atraumatic, face symmetric, no  Cushingoid features. EYES:  Pupils equal round and reactive to light and accomodation.  No conjunctivitis or scleral icterus. ENT:  Oropharynx clear without lesion.  Tongue normal. Mucous membranes moist.  RESPIRATORY:  Clear to auscultation without rales, wheezes or rhonchi. CARDIOVASCULAR:  Regular rate and rhythm without murmur, rub or gallop. BREAST:  Right breast without masses, skin changes or nipple discharge.  Left breast without masses, skin changes or nipple discharge. ABDOMEN:  Soft, non-tender, with active bowel sounds, and no hepatosplenomegaly.  No masses. BACK:  No CVA tenderness.  No tenderness on percussion of the back or rib cage. SKIN:  No rashes, ulcers or lesions. EXTREMITIES: No edema, no skin discoloration or tenderness.  No palpable cords. LYMPH NODES: No palpable cervical, supraclavicular, axillary or inguinal adenopathy  NEUROLOGICAL: Unremarkable. PSYCH:  Appropriate.   LAB RESULTS:  Appointment on 06/23/2015  Component Date Value Ref Range Status  . WBC 06/23/2015 3.6* 3.8 - 10.6 K/uL Final  . RBC 06/23/2015 3.93* 4.40 - 5.90 MIL/uL Final  . Hemoglobin 06/23/2015 11.8* 13.0 - 18.0 g/dL Final  . HCT 06/23/2015 33.8* 40.0 - 52.0 % Final  . MCV 06/23/2015 85.8  80.0 - 100.0 fL Final  . MCH 06/23/2015 30.1  26.0 - 34.0 pg Final  . MCHC 06/23/2015 35.0  32.0 - 36.0 g/dL Final  . RDW 06/23/2015 12.7  11.5 - 14.5 % Final  . Platelets 06/23/2015 173  150 - 440 K/uL Final  . Neutrophils Relative % 06/23/2015 54   Final  . Neutro Abs 06/23/2015 2.0  1.4 - 6.5 K/uL Final  . Lymphocytes Relative 06/23/2015 26   Final  . Lymphs Abs 06/23/2015 0.9* 1.0 - 3.6 K/uL Final  . Monocytes Relative 06/23/2015 12   Final  . Monocytes Absolute 06/23/2015 0.4  0.2 - 1.0 K/uL Final  . Eosinophils Relative 06/23/2015 7   Final  . Eosinophils Absolute 06/23/2015 0.2  0 - 0.7 K/uL Final  . Basophils Relative 06/23/2015 1   Final  . Basophils Absolute 06/23/2015 0.0  0 - 0.1 K/uL  Final  . Sodium 06/23/2015 134* 135 - 145 mmol/L Final  . Potassium 06/23/2015 4.8  3.5 - 5.1 mmol/L Final  . Chloride 06/23/2015 103  101 - 111 mmol/L Final  . CO2 06/23/2015 26  22 - 32 mmol/L Final  . Glucose, Bld 06/23/2015 132* 65 - 99 mg/dL Final  . BUN 06/23/2015 29* 6 - 20 mg/dL Final  . Creatinine, Ser 06/23/2015 1.25* 0.61 - 1.24 mg/dL Final  . Calcium 06/23/2015 8.7* 8.9 - 10.3 mg/dL Final  . Total Protein 06/23/2015 6.3* 6.5 - 8.1 g/dL Final  . Albumin 06/23/2015 4.0  3.5 - 5.0 g/dL Final  . AST 06/23/2015 22  15 - 41 U/L Final  . ALT  06/23/2015 55  17 - 63 U/L Final  . Alkaline Phosphatase 06/23/2015 70  38 - 126 U/L Final  . Total Bilirubin 06/23/2015 0.6  0.3 - 1.2 mg/dL Final  . GFR calc non Af Amer 06/23/2015 54* >60 mL/min Final  . GFR calc Af Amer 06/23/2015 >60  >60 mL/min Final   Comment: (NOTE) The eGFR has been calculated using the CKD EPI equation. This calculation has not been validated in all clinical situations. eGFR's persistently <60 mL/min signify possible Chronic Kidney Disease.   . Anion gap 06/23/2015 5  5 - 15 Final      STUDIES: Abdomen 1 View (kub)  06/18/2015   CLINICAL DATA:  Right back pain, history of renal calculi  EXAM: ABDOMEN - 1 VIEW  COMPARISON:  09/23/2015  FINDINGS: Bilateral renal calculi are reidentified. Normal bowel-gas pattern. No free air. Prostatic brachytherapy seeds. Moderate lumbar spine disc degenerative change and rightward curvature centered at L3 reidentified.  IMPRESSION: No significant change in bilateral renal calculi.   Electronically Signed   By: Conchita Paris M.D.   On: 06/18/2015 16:21   Mr Shoulder Right Wo Contrast  06/05/2015   CLINICAL DATA:  RIGHT shoulder soft tissue mass. Palpable abnormality in the anterior aspect of the shoulder. No shoulder pain and no limited range of motion.  EXAM: MRI OF THE RIGHT SHOULDER WITHOUT CONTRAST  TECHNIQUE: Multiplanar, multisequence MR imaging of the shoulder was  performed. No intravenous contrast was administered.  COMPARISON:  None.  FINDINGS: There is a fatty lesion in the anterior deltoid that measures 3.4 cm transverse, 1.7 cm AP and 3.4 cm craniocaudal. Muscular fibers extend through this fatty lesion and this is most compatible with benign intramuscular lipoma.  The marker for palpable abnormality is along the posterior margin of this intramuscular mass and is located between the severely degenerated AC joint and a lipoma.  Rotator cuff: SUPRASPINATUS and INFRASPINATUS tendinopathy with bursal and articular surface fraying of the tendon. No discrete tear. Teres minor tendon is normal. The SUBSCAPULARIS tendon shows severe tendinopathy without tear.  Muscles:  No atrophy or edema of the rotator cuff.  Biceps long head: Medial subluxation of the biceps long head tendon at the biceps pulley. Severe tendinopathy.  Acromioclavicular Joint: Severe AC joint osteoarthritis. Type 1 flat acromion. Subacromial bursitis.  Glenohumeral Joint: Mild glenohumeral osteoarthritis with grade II glenoid chondromalacia. Tiny glenohumeral effusion.  Labrum: Abnormal appearance of the labrum. SLAP tear is present with posterior extension to the posterior inferior quadrant. Tiny paralabral cyst is present at the 6 o'clock position of the labrum. Anterior inferior labrum appears intact.  Bones:  No significant extra-articular findings.  IMPRESSION: 1. 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 The marker is interposed between the severely degenerated AC joint and lipoma making it less clear whether the palpable abnormality is the degenerated AC joint or the lipoma. 2. Rotator cuff tendinopathy without tear. 3. Severe biceps long head tendinopathy with medial subluxation of the biceps long head at the biceps pulley. 4. Probable SLAP tear with posterior extension to the posterior inferior quadrant. 5. Mild glenohumeral osteoarthritis.    Electronically Signed   By: Dereck Ligas M.D.   On: 06/05/2015 14:57    ASSESSMENT: Recurrent carcinoma prostate.  Locally advanced disease status post radiation and 6 months of Lupron therapy Right shoulder pain with negative MRI for any metastases History of kidney stones  MEDICAL DECISION MAKING:  All lab data has been reviewed.  Last  PSA in March of 2016 was 0.2 Patient had dysuria total 6 months of  androgen deprivation therapy.  Aspirin cc at guideline that was duration was recommended for 4-6 month so no further injections of Lupron would be given PSA would be rechecked Patient will be followed in 6 month  Patient expressed understanding and was in agreement with this plan. He also understands that He can call clinic at any time with any questions, concerns, or complaints.    No matching staging information was found for the patient.  Forest Gleason, MD   06/23/2015 10:02 AM

## 2015-07-06 ENCOUNTER — Ambulatory Visit
Admission: RE | Admit: 2015-07-06 | Discharge: 2015-07-06 | Disposition: A | Discharge status: Home / Self Care | Payer: Medicare Other | Source: Ambulatory Visit | Attending: Radiation Oncology | Admitting: Radiation Oncology

## 2015-07-06 ENCOUNTER — Ambulatory Visit
Admission: RE | Admit: 2015-07-06 | Discharge: 2015-07-06 | Disposition: A | Discharge status: Home / Self Care | Payer: Medicare Other | Source: Ambulatory Visit | Attending: Urology | Admitting: Urology

## 2015-07-06 ENCOUNTER — Encounter: Payer: Self-pay | Admitting: Radiation Oncology

## 2015-07-06 VITALS — BP 150/62 | HR 53 | Resp 18 | Wt 205.5 lb

## 2015-07-06 DIAGNOSIS — C61 Malignant neoplasm of prostate: Secondary | ICD-10-CM

## 2015-07-06 DIAGNOSIS — N200 Calculus of kidney: Secondary | ICD-10-CM | POA: Diagnosis present

## 2015-07-06 DIAGNOSIS — N202 Calculus of kidney with calculus of ureter: Secondary | ICD-10-CM | POA: Diagnosis not present

## 2015-07-06 NOTE — Progress Notes (Signed)
Radiation Oncology Follow up Note  Name: Alex Lang   Date:   07/06/2015 MRN:  [id number] DOB: 1936/01/27    This 79 y.o. male presents to the clinic today for follow-up for prostate cancer status post seed implant 5 months prior.  REFERRING PROVIDER: No ref. provider found  HPI: patient is a 79 year old male now out 5 months having completed I-125 interstitial implant for a stage IIa (T2 way N0 M0) adenocarcinoma the prostate mostly Gleason 6 (3+3) presenting the PSA of 4.8. He is seen today in routine follow-up and is doing well. He specifically denies any lower urinary tract symptoms diarrhea. Recently had a PSA performed which was less than 0.1.Marland Kitchen  COMPLICATIONS OF TREATMENT: none  FOLLOW UP COMPLIANCE: keeps appointments   PHYSICAL EXAM:  BP 150/62 mmHg  Pulse 53  Resp 18  Wt 205 lb 7.5 oz (93.2 kg) On rectal exam rectal sphincter tone is good. Prostate is smooth contracted without evidence of nodularity or mass. Sulcus is preserved bilaterally. No discrete nodularity is identified. No other rectal abnormalities are noted. Well-developed well-nourished patient in NAD. HEENT reveals PERLA, EOMI, discs not visualized.  Oral cavity is clear. No oral mucosal lesions are identified. Neck is clear without evidence of cervical or supraclavicular adenopathy. Lungs are clear to A&P. Cardiac examination is essentially unremarkable with regular rate and rhythm without murmur rub or thrill. Abdomen is benign with no organomegaly or masses noted. Motor sensory and DTR levels are equal and symmetric in the upper and lower extremities. Cranial nerves II through XII are grossly intact. Proprioception is intact. No peripheral adenopathy or edema is identified. No motor or sensory levels are noted. Crude visual fields are within normal range.  RADIOLOGY RESULTS: no new radiology reports to review  PLAN: at the present time he is under good biochemical control of his prostate cancer. I'm please  was overall progress. I've asked to see him back in 6 months for follow-up. Patient knows to call sooner with any concerns.  I would like to take this opportunity for allowing me to participate in the care of your patient.Armstead Peaks., MD

## 2015-07-16 ENCOUNTER — Encounter: Payer: Self-pay | Admitting: Urology

## 2015-07-16 ENCOUNTER — Ambulatory Visit (INDEPENDENT_AMBULATORY_CARE_PROVIDER_SITE_OTHER): Payer: Medicare Other | Admitting: Urology

## 2015-07-16 VITALS — BP 145/72 | HR 52 | Ht 69.0 in | Wt 202.4 lb

## 2015-07-16 DIAGNOSIS — R109 Unspecified abdominal pain: Secondary | ICD-10-CM

## 2015-07-16 DIAGNOSIS — C61 Malignant neoplasm of prostate: Secondary | ICD-10-CM

## 2015-07-16 DIAGNOSIS — N200 Calculus of kidney: Secondary | ICD-10-CM | POA: Diagnosis not present

## 2015-07-16 LAB — URINALYSIS, COMPLETE
Bilirubin, UA: NEGATIVE
Glucose, UA: NEGATIVE
Ketones, UA: NEGATIVE
LEUKOCYTES UA: NEGATIVE
NITRITE UA: NEGATIVE
Protein, UA: NEGATIVE
UUROB: 0.2 mg/dL (ref 0.2–1.0)
pH, UA: 5 (ref 5.0–7.5)

## 2015-07-16 LAB — MICROSCOPIC EXAMINATION
RBC, UA: >30 /hpf — AB (ref 0–?)
WBC UA: NONE SEEN /HPF (ref 0–?)

## 2015-07-16 NOTE — Assessment & Plan Note (Signed)
Patient has known bilateral nephrolithiasis recommended by CT. He has not passed any fragments. We'll obtain a KUB to evaluate the current status of the bilateral nephrolithiasis.   - Urinalysis, Complete  2. Right flank pain: Patient his been experiencing right-sided flank pain for the past 4 days. Nonobstructing stones are usually not responsible for flank pain. We will obtain a KUB to ascertain whether or not the right-sided stone has moved into the ureter.  KUB did not demonstrate an ureteral stone. We will obtain a non contrast CT for further evaluation of his flank pain.

## 2015-07-16 NOTE — Assessment & Plan Note (Signed)
cT2a Prostate cancer- Dx in 10/2014 with Gleason 3+4, 3+3 prostate cancer involving 8 of 13 cores, iPSA 4.8 DRE also somewhat concerning for cancer with questionable induration at right base. TRUS vol 45 cc. He underwent I-125 brachytherapy seed placement on 01/26/2015 by Dr. Hollice Espy. He is also receiving adjunctive ADT therapy for 4-6 months. He is currently being followed by Cancer center and will see seeing him in July for an office visit where they will obtain a PSA.

## 2015-07-16 NOTE — Progress Notes (Signed)
07/16/2015 2:43 PM   Alex Lang 1936-05-27 [id number]  Referring provider: No referring provider defined for this encounter.  Chief Complaint  Patient presents with  . Results    CT Results    HPI: Alex Lang is a 79 year old white male who presents today to discuss his CT scan report.  He has passed three stones since his last visit with [place] and brings them with him today.    His scan has noted bilateral nephrolithiasis and a 2 mm non-obstructing right ureteral stone.  I have reviewed the films with the patient.    Today, he is still having some mild bilateral flank discomfort.  He denies any gross hematuria, fevers, chills, nausea or vomiting.  His was also seen recently at [facility] and was instructed to follow up in 6 months with them.  His current PSA was 0.04 ng/mL on 06/23/2015.     PREVIOUS HISTORY: HPI Comments: Alex Lang is a 79 year old white male with a history of nephrolithiasis who has been experiencing right flank pain for the last four days. He states the pain has been quite constant since its onset. He describes it as a dull ache. His wife states he has been quite irritable and that is out of character for him. The pain is independent of movement or time of day. He does not complain of fevers, chills, nausea, vomiting or blood in the urine.   A CT scan performed in 2013 noted bilateral nephrolithiasis.  Patient also has cT2a Prostate cancer- Dx in 10/2014 with Gleason 3+4, 3+3 prostate cancer involving 8 of 13 cores, iPSA 4.8 DRE also somewhat concerning for cancer with questionable induration at right base. TRUS vol 45 cc. He underwent I-125 brachytherapy seed placement on 01/26/2015 by Dr. Hollice Espy. He is also receiving adjunctive ADT therapy for 4-6 months. He is currently being followed by Dr. Donella Stade and will see seeing him soon for an office visit where they will obtain a PSA.    PMH: Past Medical History   Diagnosis Date  . Kidney stone   . Prostate cancer   . Hypertension   . Acid reflux     Surgical History: Past Surgical History  Procedure Laterality Date  . Prostate surgery    . Kidney stone surgery      Home Medications:    Medication List       This list is accurate as of: 07/16/15  2:43 PM.  Always use your most recent med list.               benazepril 5 MG tablet  Commonly known as:  LOTENSIN  Take by mouth.     CALCIUM 600 600 MG Tabs tablet  Generic drug:  calcium carbonate  Take by mouth.     FISH OIL + D3 PO  Take by mouth.     Ginkgo Biloba 40 MG Tabs  Take by mouth.     Lysine 500 MG Caps  Take by mouth.     omeprazole 20 MG capsule  Commonly known as:  PRILOSEC  Take by mouth.     tamsulosin 0.4 MG Caps capsule  Commonly known as:  FLOMAX  Take by mouth.     Vitamin D3 2000 UNITS capsule  Take by mouth.        Allergies:  Allergies  Allergen Reactions  . Other Nausea And Vomiting    Family History: Family History  Problem Relation Age of  Onset  . Hypertension Mother   . Cancer Maternal Grandfather   . Cancer Brother   . Prostate cancer Brother     Social History:  reports that he has never smoked. He has never used smokeless tobacco. He reports that he does not drink alcohol or use illicit drugs.  ROS: UROLOGY Frequent Urination?: No Hard to postpone urination?: No Burning/pain with urination?: No Get up at night to urinate?: No Leakage of urine?: No Urine stream starts and stops?: No Trouble starting stream?: No Do you have to strain to urinate?: No Blood in urine?: No Urinary tract infection?: No Sexually transmitted disease?: No Injury to kidneys or bladder?: No Painful intercourse?: No Weak stream?: No Erection problems?: No Penile pain?: No  Gastrointestinal Nausea?: No Vomiting?: No Indigestion/heartburn?: No Diarrhea?: No Constipation?: No  Constitutional Fever: No Night sweats?: No Weight  loss?: No Fatigue?: No  Skin Skin rash/lesions?: No Itching?: No  Eyes Blurred vision?: No Double vision?: No  Ears/Nose/Throat Sore throat?: No Sinus problems?: No  Hematologic/Lymphatic Swollen glands?: No Easy bruising?: No  Cardiovascular Leg swelling?: No Chest pain?: No  Respiratory Cough?: No Shortness of breath?: No  Endocrine Excessive thirst?: No  Musculoskeletal Back pain?: No Joint pain?: No  Neurological Headaches?: No Dizziness?: No  Psychologic Depression?: No Anxiety?: No  Physical Exam: BP 145/72 mmHg  Pulse 52  Ht 5\' 9"  (1.753 m)  Wt 202 lb 6.4 oz (91.808 kg)  BMI 29.88 kg/m2  Constitutional:  Alert and oriented, No acute distress. HEENT: [name] AT, moist mucus membranes.  Trachea midline, no masses. Cardiovascular: No clubbing, cyanosis, or edema. Respiratory: Normal respiratory effort, no increased work of breathing. GI: Abdomen is soft, nontender, nondistended, no abdominal masses GU: No CVA tenderness.  Skin: No rashes, bruises or suspicious lesions. Lymph: No cervical or inguinal adenopathy. Neurologic: Grossly intact, no focal deficits, moving all 4 extremities. Psychiatric: Normal mood and affect.  Laboratory Data: Results for orders placed or performed in visit on 07/16/15  Microscopic Examination  Result Value Ref Range   WBC, UA None seen 0 -  5 /hpf   RBC, UA >30 (A) 0 -  2 /hpf   Epithelial Cells (non renal) 0-10 0 - 10 /hpf   Mucus, UA Present (A) Not Estab.   Bacteria, UA Few None seen/Few  Urinalysis, Complete  Result Value Ref Range   Specific Gravity, UA >1.030 (H) 1.005 - 1.030   pH, UA 5.0 5.0 - 7.5   Color, UA Yellow Yellow   Appearance Ur Clear Clear   Leukocytes, UA Negative Negative   Protein, UA Negative Negative/Trace   Glucose, UA Negative Negative   Ketones, UA Negative Negative   RBC, UA 3+ (A) Negative   Bilirubin, UA Negative Negative   Urobilinogen, Ur 0.2 0.2 - 1.0 mg/dL   Nitrite, UA  Negative Negative   Microscopic Examination See below:     Lab Results  Component Value Date   WBC 3.6* 06/23/2015   HGB 11.8* 06/23/2015   HCT 33.8* 06/23/2015   MCV 85.8 06/23/2015   PLT 173 06/23/2015    Lab Results  Component Value Date   CREATININE 1.25* 06/23/2015    Lab Results  Component Value Date   PSA 0.04 06/23/2015   PSA 0.2 03/16/2015   PSA 5.1* 12/15/2014    No results found for: TESTOSTERONE  No results found for: HGBA1C  Urinalysis    Component Value Date/Time   GLUCOSEU Negative 07/16/2015 1057   BILIRUBINUR Negative 07/16/2015  1057   NITRITE Negative 07/16/2015 1057   LEUKOCYTESUR Negative 07/16/2015 1057    Pertinent Imaging: CLINICAL DATA: Right flank pain and gross hematuria 2 weeks ago, history of renal calculi  EXAM: CT ABDOMEN AND PELVIS WITHOUT CONTRAST  TECHNIQUE: Multidetector CT imaging of the abdomen and pelvis was performed following the standard protocol without IV contrast.  COMPARISON: 10/23/2012, abdominal radiographs 06/18/2015  FINDINGS: Lower chest: Lung bases are clear. No pleural effusion.  Hepatobiliary: Unenhanced liver and gallbladder are unremarkable with the exception of a stable 1.3 cm hypodense lesion lateral segment left hepatic lobe image 21, with previous measurements of fluid density likely a cyst. Suggestion of fatty liver with sparing about the gallbladder fossa.  Pancreas: Normal  Spleen: Normal  Adrenals/Urinary Tract: Bilateral nonobstructing renal calculi are identified, largest left mid kidney 1.2 cm image 40 and largest right lower renal pole 1.0 cm image 40. Bilateral lower pole renal cortical cysts are noted, largest left lower renal pole measuring 3.0 cm image 45. No hydroureteronephrosis. Adrenal glands are unremarkable. The bladder is unremarkable. 2 mm distal right ureteral calculus is identified image 80.  Stomach/Bowel: Appendix is normal. No bowel wall thickening or  focal segmental dilatation is identified.  Vascular/Lymphatic: No lymphadenopathy. Mild atheromatous aortic calcification without aneurysm.  Other: Small right and trace left fat containing inguinal hernias are noted. No free air or fluid. Prostatic brachytherapy seeds.  Musculoskeletal: Incidental note is made of asymmetric atrophy of the upper right lower extremity musculature which may be seen with polio or other neuromuscular disorders. Multilevel disc degenerative change noted in the spine.  IMPRESSION: 2 mm nonobstructing distal right ureteral calculus.  Nonobstructing bilateral renal calculi.   Electronically Signed  By: Conchita Paris M.D.  On: 07/06/2015 13:30  Assessment and Plan:   1. Kidney stones: Bilateral stones are identified on recent KUB (06/18/2015).  The CT scan performed on 07/06/2015 noted a 2 mm right ureteral stone, which the patient has passed.  He and his wife would like definitive treatment for the remaining stones.   I discussed ESWL and URS/LL/stent placement.  He has had both procedures in the past and is more comfortable with ESWL.   I did explain to the patient that undergoing ESWL did not guarantee he would not have to undergo URS/LL/stent placement.  I stated that the ESWL may not be successful in fragmenting the stones due to the position and size. I also explained the phenomenon of Ste[place]rausse with the stones that would require a ureteral stent placement.  Also, I informed the patient that the stones would require multiple treatments.    - Urinalysis, Complete -Urine Culture  Schedule Right ESWL for right renal stone.    2. Bilateral flank pain: Patient is now experiencing mild bilateral flank pain.  He would like definitive treatments for the stones.   3. Prostate cancer: cT2a Prostate cancer- Dx in 10/2014 with Gleason 3+4, 3+3 prostate cancer involving 8 of 13 cores, iPSA 4.8 DRE also somewhat concerning for cancer with  questionable induration at right base. TRUS vol 45 cc. He underwent I-125 brachytherapy seed placement on 01/26/2015 by Dr. Hollice Espy. He is also receiving adjunctive ADT therapy for 4-6 months. He is currently being followed by Cancer center and will see seeing him in July for an office visit where they will obtain a PSA.  PSA was 0.004 ng/mL on 06/23/2015.  The Cancer center will see the patient again in 6 months.     No Follow-up on file.  Zara Council, [facility] [address] [place], [place] [address] [phone]

## 2015-07-18 LAB — CULTURE, URINE COMPREHENSIVE

## 2015-07-20 ENCOUNTER — Encounter
Admission: RE | Admit: 2015-07-20 | Discharge: 2015-07-20 | Disposition: A | Discharge status: Home / Self Care | Payer: Medicare Other | Source: Ambulatory Visit | Attending: Urology | Admitting: Urology

## 2015-07-20 DIAGNOSIS — I10 Essential (primary) hypertension: Secondary | ICD-10-CM | POA: Diagnosis not present

## 2015-07-20 DIAGNOSIS — K219 Gastro-esophageal reflux disease without esophagitis: Secondary | ICD-10-CM | POA: Diagnosis not present

## 2015-07-20 DIAGNOSIS — N202 Calculus of kidney with calculus of ureter: Secondary | ICD-10-CM | POA: Diagnosis not present

## 2015-07-20 DIAGNOSIS — Z8546 Personal history of malignant neoplasm of prostate: Secondary | ICD-10-CM | POA: Diagnosis not present

## 2015-07-20 DIAGNOSIS — N200 Calculus of kidney: Secondary | ICD-10-CM | POA: Diagnosis present

## 2015-07-20 DIAGNOSIS — Z87442 Personal history of urinary calculi: Secondary | ICD-10-CM | POA: Diagnosis not present

## 2015-07-20 DIAGNOSIS — R310 Gross hematuria: Secondary | ICD-10-CM | POA: Diagnosis not present

## 2015-07-21 ENCOUNTER — Encounter: Payer: Self-pay | Admitting: *Deleted

## 2015-07-23 ENCOUNTER — Ambulatory Visit: Payer: Medicare Other

## 2015-07-23 ENCOUNTER — Encounter: Payer: Self-pay | Admitting: Certified Registered Nurse Anesthetist

## 2015-07-23 ENCOUNTER — Encounter
Admission: RE | Disposition: A | Discharge status: Home / Self Care | Payer: Self-pay | Source: Ambulatory Visit | Attending: Urology

## 2015-07-23 ENCOUNTER — Ambulatory Visit
Admission: RE | Admit: 2015-07-23 | Discharge: 2015-07-23 | Disposition: A | Discharge status: Home / Self Care | Payer: Medicare Other | Source: Ambulatory Visit | Attending: Urology | Admitting: Urology

## 2015-07-23 DIAGNOSIS — Z8546 Personal history of malignant neoplasm of prostate: Secondary | ICD-10-CM | POA: Insufficient documentation

## 2015-07-23 DIAGNOSIS — Z87442 Personal history of urinary calculi: Secondary | ICD-10-CM | POA: Insufficient documentation

## 2015-07-23 DIAGNOSIS — N202 Calculus of kidney with calculus of ureter: Secondary | ICD-10-CM | POA: Diagnosis not present

## 2015-07-23 DIAGNOSIS — N200 Calculus of kidney: Secondary | ICD-10-CM | POA: Diagnosis not present

## 2015-07-23 DIAGNOSIS — K219 Gastro-esophageal reflux disease without esophagitis: Secondary | ICD-10-CM | POA: Insufficient documentation

## 2015-07-23 DIAGNOSIS — R310 Gross hematuria: Secondary | ICD-10-CM | POA: Insufficient documentation

## 2015-07-23 DIAGNOSIS — I10 Essential (primary) hypertension: Secondary | ICD-10-CM | POA: Insufficient documentation

## 2015-07-23 HISTORY — DX: Calculus of kidney: N20.0

## 2015-07-23 HISTORY — PX: EXTRACORPOREAL SHOCK WAVE LITHOTRIPSY: SHX1557

## 2015-07-23 SURGERY — LITHOTRIPSY, ESWL
Anesthesia: Choice | Laterality: Right

## 2015-07-23 MED ORDER — MORPHINE SULFATE 4 MG/ML IJ SOLN
6.0000 mg | Freq: Once | INTRAMUSCULAR | Status: AC
  Administered 2015-07-23: 4 mg via INTRAMUSCULAR

## 2015-07-23 MED ORDER — HYDROCODONE-ACETAMINOPHEN 5-325 MG PO TABS: 1.0000 | ORAL_TABLET | Freq: Four times a day (QID) | ORAL | Status: DC | PRN

## 2015-07-23 MED ORDER — DEXTROSE-NACL 5-0.45 % IV SOLN
INTRAVENOUS | Status: DC
  Administered 2015-07-23: 07:00:00 via INTRAVENOUS

## 2015-07-23 MED ORDER — PROMETHAZINE HCL 25 MG/ML IJ SOLN
25.0000 mg | Freq: Once | INTRAMUSCULAR | Status: AC
  Administered 2015-07-23: 25 mg via INTRAMUSCULAR

## 2015-07-23 MED ORDER — PROMETHAZINE HCL 25 MG/ML IJ SOLN
INTRAMUSCULAR | Status: AC
  Administered 2015-07-23: 25 mg via INTRAMUSCULAR
  Filled 2015-07-23: qty 1

## 2015-07-23 MED ORDER — MORPHINE SULFATE 2 MG/ML IJ SOLN
INTRAMUSCULAR | Status: AC
  Administered 2015-07-23: 2 mg via INTRAMUSCULAR
  Filled 2015-07-23: qty 1

## 2015-07-23 MED ORDER — MORPHINE SULFATE 4 MG/ML IJ SOLN
INTRAMUSCULAR | Status: AC
  Administered 2015-07-23: 4 mg via INTRAMUSCULAR
  Filled 2015-07-23: qty 1

## 2015-07-23 MED ORDER — CIPROFLOXACIN HCL 500 MG PO TABS
500.0000 mg | ORAL_TABLET | ORAL | Status: AC
  Administered 2015-07-23: 500 mg via ORAL

## 2015-07-23 MED ORDER — CIPROFLOXACIN HCL 500 MG PO TABS
ORAL_TABLET | ORAL | Status: AC
  Administered 2015-07-23: 500 mg via ORAL
  Filled 2015-07-23: qty 1

## 2015-07-23 NOTE — OR Nursing (Signed)
Redness on right flank

## 2015-07-23 NOTE — Interval H&P Note (Signed)
History and Physical Interval Note:  07/23/2015 7:58 AM  Alex Lang  has presented today for surgery, with the diagnosis of kidney stone  The various methods of treatment have been discussed with the patient and family. After consideration of risks, benefits and other options for treatment, the patient has consented to  Procedure(s): EXTRACORPOREAL SHOCK WAVE LITHOTRIPSY (ESWL) (Right) as a surgical intervention .  The patient's history has been reviewed, patient examined, no change in status, stable for surgery.  I have reviewed the patient's chart and labs.  Questions were answered to the patient's satisfaction.     Hollice Espy

## 2015-07-23 NOTE — Discharge Instructions (Signed)
See [facility] discharge instructions in chart.  AMBULATORY SURGERY  DISCHARGE INSTRUCTIONS   1) The drugs that you were given will stay in your system until tomorrow so for the next 24 hours you should not:  A) Drive an automobile B) Make any legal decisions C) Drink any alcoholic beverage   2) You may resume regular meals tomorrow.  Today it is better to start with liquids and gradually work up to solid foods.  You may eat anything you prefer, but it is better to start with liquids, then soup and crackers, and gradually work up to solid foods.   3) Please notify your doctor immediately if you have any unusual bleeding, trouble breathing, redness and pain at the surgery site, drainage, fever, or pain not relieved by medication.    4) Additional Instructions:        Please contact your physician with any problems or Same Day Surgery at [phone], Monday through Friday 6 am to 4 pm, or [place] at  Main number at [phone].  

## 2015-07-23 NOTE — H&P (View-Only) (Signed)
07/16/2015 2:43 PM   Alex Lang June 04, 1936 [id number]  Referring provider: No referring provider defined for this encounter.  Chief Complaint  Patient presents with  . Results    CT Results    HPI: Alex Lang is a 79 year old white male who presents today to discuss his CT scan report.  He has passed three stones since his last visit with [place] and brings them with him today.    His scan has noted bilateral nephrolithiasis and a 2 mm non-obstructing right ureteral stone.  I have reviewed the films with the patient.    Today, he is still having some mild bilateral flank discomfort.  He denies any gross hematuria, fevers, chills, nausea or vomiting.  His was also seen recently at [facility] and was instructed to follow up in 6 months with them.  His current PSA was 0.04 ng/mL on 06/23/2015.     PREVIOUS HISTORY: HPI Comments: Alex Lang is a 79 year old white male with a history of nephrolithiasis who has been experiencing right flank pain for the last four days. He states the pain has been quite constant since its onset. He describes it as a dull ache. His wife states he has been quite irritable and that is out of character for him. The pain is independent of movement or time of day. He does not complain of fevers, chills, nausea, vomiting or blood in the urine.   A CT scan performed in 2013 noted bilateral nephrolithiasis.  Patient also has cT2a Prostate cancer- Dx in 10/2014 with Gleason 3+4, 3+3 prostate cancer involving 8 of 13 cores, iPSA 4.8 DRE also somewhat concerning for cancer with questionable induration at right base. TRUS vol 45 cc. He underwent I-125 brachytherapy seed placement on 01/26/2015 by Alex Lang. He is also receiving adjunctive ADT therapy for 4-6 months. He is currently being followed by Alex Lang and will see seeing him soon for an office visit where they will obtain a PSA.    PMH: Past Medical History   Diagnosis Date  . Kidney stone   . Prostate cancer   . Hypertension   . Acid reflux     Surgical History: Past Surgical History  Procedure Laterality Date  . Prostate surgery    . Kidney stone surgery      Home Medications:    Medication List       This list is accurate as of: 07/16/15  2:43 PM.  Always use your most recent med list.               benazepril 5 MG tablet  Commonly known as:  LOTENSIN  Take by mouth.     CALCIUM 600 600 MG Tabs tablet  Generic drug:  calcium carbonate  Take by mouth.     FISH OIL + D3 PO  Take by mouth.     Ginkgo Biloba 40 MG Tabs  Take by mouth.     Lysine 500 MG Caps  Take by mouth.     omeprazole 20 MG capsule  Commonly known as:  PRILOSEC  Take by mouth.     tamsulosin 0.4 MG Caps capsule  Commonly known as:  FLOMAX  Take by mouth.     Vitamin D3 2000 UNITS capsule  Take by mouth.        Allergies:  Allergies  Allergen Reactions  . Other Nausea And Vomiting    Family History: Family History  Problem Relation Age of  Onset  . Hypertension Mother   . Cancer Maternal Grandfather   . Cancer Brother   . Prostate cancer Brother     Social History:  reports that he has never smoked. He has never used smokeless tobacco. He reports that he does not drink alcohol or use illicit drugs.  ROS: UROLOGY Frequent Urination?: No Hard to postpone urination?: No Burning/pain with urination?: No Get up at night to urinate?: No Leakage of urine?: No Urine stream starts and stops?: No Trouble starting stream?: No Do you have to strain to urinate?: No Blood in urine?: No Urinary tract infection?: No Sexually transmitted disease?: No Injury to kidneys or bladder?: No Painful intercourse?: No Weak stream?: No Erection problems?: No Penile pain?: No  Gastrointestinal Nausea?: No Vomiting?: No Indigestion/heartburn?: No Diarrhea?: No Constipation?: No  Constitutional Fever: No Night sweats?: No Weight  loss?: No Fatigue?: No  Skin Skin rash/lesions?: No Itching?: No  Eyes Blurred vision?: No Double vision?: No  Ears/Nose/Throat Sore throat?: No Sinus problems?: No  Hematologic/Lymphatic Swollen glands?: No Easy bruising?: No  Cardiovascular Leg swelling?: No Chest pain?: No  Respiratory Cough?: No Shortness of breath?: No  Endocrine Excessive thirst?: No  Musculoskeletal Back pain?: No Joint pain?: No  Neurological Headaches?: No Dizziness?: No  Psychologic Depression?: No Anxiety?: No  Physical Exam: BP 145/72 mmHg  Pulse 52  Ht 5\' 9"  (1.753 m)  Wt 202 lb 6.4 oz (91.808 kg)  BMI 29.88 kg/m2  Constitutional:  Alert and oriented, No acute distress. HEENT: [place] AT, moist mucus membranes.  Trachea midline, no masses. Cardiovascular: No clubbing, cyanosis, or edema. Respiratory: Normal respiratory effort, no increased work of breathing. GI: Abdomen is soft, nontender, nondistended, no abdominal masses GU: No CVA tenderness.  Skin: No rashes, bruises or suspicious lesions. Lymph: No cervical or inguinal adenopathy. Neurologic: Grossly intact, no focal deficits, moving all 4 extremities. Psychiatric: Normal mood and affect.  Laboratory Data: Results for orders placed or performed in visit on 07/16/15  Microscopic Examination  Result Value Ref Range   WBC, UA None seen 0 -  5 /hpf   RBC, UA >30 (A) 0 -  2 /hpf   Epithelial Cells (non renal) 0-10 0 - 10 /hpf   Mucus, UA Present (A) Not Estab.   Bacteria, UA Few None seen/Few  Urinalysis, Complete  Result Value Ref Range   Specific Gravity, UA >1.030 (H) 1.005 - 1.030   pH, UA 5.0 5.0 - 7.5   Color, UA Yellow Yellow   Appearance Ur Clear Clear   Leukocytes, UA Negative Negative   Protein, UA Negative Negative/Trace   Glucose, UA Negative Negative   Ketones, UA Negative Negative   RBC, UA 3+ (A) Negative   Bilirubin, UA Negative Negative   Urobilinogen, Ur 0.2 0.2 - 1.0 mg/dL   Nitrite, UA  Negative Negative   Microscopic Examination See below:     Lab Results  Component Value Date   WBC 3.6* 06/23/2015   HGB 11.8* 06/23/2015   HCT 33.8* 06/23/2015   MCV 85.8 06/23/2015   PLT 173 06/23/2015    Lab Results  Component Value Date   CREATININE 1.25* 06/23/2015    Lab Results  Component Value Date   PSA 0.04 06/23/2015   PSA 0.2 03/16/2015   PSA 5.1* 12/15/2014    No results found for: TESTOSTERONE  No results found for: HGBA1C  Urinalysis    Component Value Date/Time   GLUCOSEU Negative 07/16/2015 1057   BILIRUBINUR Negative 07/16/2015  1057   NITRITE Negative 07/16/2015 1057   LEUKOCYTESUR Negative 07/16/2015 1057    Pertinent Imaging: CLINICAL DATA: Right flank pain and gross hematuria 2 weeks ago, history of renal calculi  EXAM: CT ABDOMEN AND PELVIS WITHOUT CONTRAST  TECHNIQUE: Multidetector CT imaging of the abdomen and pelvis was performed following the standard protocol without IV contrast.  COMPARISON: 10/23/2012, abdominal radiographs 06/18/2015  FINDINGS: Lower chest: Lung bases are clear. No pleural effusion.  Hepatobiliary: Unenhanced liver and gallbladder are unremarkable with the exception of a stable 1.3 cm hypodense lesion lateral segment left hepatic lobe image 21, with previous measurements of fluid density likely a cyst. Suggestion of fatty liver with sparing about the gallbladder fossa.  Pancreas: Normal  Spleen: Normal  Adrenals/Urinary Tract: Bilateral nonobstructing renal calculi are identified, largest left mid kidney 1.2 cm image 40 and largest right lower renal pole 1.0 cm image 40. Bilateral lower pole renal cortical cysts are noted, largest left lower renal pole measuring 3.0 cm image 45. No hydroureteronephrosis. Adrenal glands are unremarkable. The bladder is unremarkable. 2 mm distal right ureteral calculus is identified image 80.  Stomach/Bowel: Appendix is normal. No bowel wall thickening or  focal segmental dilatation is identified.  Vascular/Lymphatic: No lymphadenopathy. Mild atheromatous aortic calcification without aneurysm.  Other: Small right and trace left fat containing inguinal hernias are noted. No free air or fluid. Prostatic brachytherapy seeds.  Musculoskeletal: Incidental note is made of asymmetric atrophy of the upper right lower extremity musculature which may be seen with polio or other neuromuscular disorders. Multilevel disc degenerative change noted in the spine.  IMPRESSION: 2 mm nonobstructing distal right ureteral calculus.  Nonobstructing bilateral renal calculi.   Electronically Signed  By: Conchita Paris M.D.  On: 07/06/2015 13:30  Assessment and Plan:   1. Kidney stones: Bilateral stones are identified on recent KUB (06/18/2015).  The CT scan performed on 07/06/2015 noted a 2 mm right ureteral stone, which the patient has passed.  He and his wife would like definitive treatment for the remaining stones.   I discussed ESWL and URS/LL/stent placement.  He has had both procedures in the past and is more comfortable with ESWL.   I did explain to the patient that undergoing ESWL did not guarantee he would not have to undergo URS/LL/stent placement.  I stated that the ESWL may not be successful in fragmenting the stones due to the position and size. I also explained the phenomenon of Steinstrausse with the stones that would require a ureteral stent placement.  Also, I informed the patient that the stones would require multiple treatments.    - Urinalysis, Complete -Urine Culture  Schedule Right ESWL for right renal stone.    2. Bilateral flank pain: Patient is now experiencing mild bilateral flank pain.  He would like definitive treatments for the stones.   3. Prostate cancer: cT2a Prostate cancer- Dx in 10/2014 with Gleason 3+4, 3+3 prostate cancer involving 8 of 13 cores, iPSA 4.8 DRE also somewhat concerning for cancer with  questionable induration at right base. TRUS vol 45 cc. He underwent I-125 brachytherapy seed placement on 01/26/2015 by Alex Lang. He is also receiving adjunctive ADT therapy for 4-6 months. He is currently being followed by Cancer center and will see seeing him in July for an office visit where they will obtain a PSA.  PSA was 0.004 ng/mL on 06/23/2015.  The Cancer center will see the patient again in 6 months.     No Follow-up on file.  Zara Council, [facility] [address] [place], [place] [address] [phone]

## 2015-07-30 ENCOUNTER — Encounter: Payer: Self-pay | Admitting: Urology

## 2015-08-03 ENCOUNTER — Encounter: Payer: Self-pay | Admitting: *Deleted

## 2015-08-11 ENCOUNTER — Ambulatory Visit (INDEPENDENT_AMBULATORY_CARE_PROVIDER_SITE_OTHER): Payer: Medicare Other | Admitting: Urology

## 2015-08-11 ENCOUNTER — Encounter: Payer: Self-pay | Admitting: Urology

## 2015-08-11 ENCOUNTER — Ambulatory Visit
Admission: RE | Admit: 2015-08-11 | Discharge: 2015-08-11 | Disposition: A | Discharge status: Home / Self Care | Payer: Medicare Other | Source: Ambulatory Visit | Attending: Urology | Admitting: Urology

## 2015-08-11 VITALS — BP 186/79 | HR 55 | Ht 69.0 in | Wt 202.4 lb

## 2015-08-11 DIAGNOSIS — N200 Calculus of kidney: Secondary | ICD-10-CM

## 2015-08-11 DIAGNOSIS — C61 Malignant neoplasm of prostate: Secondary | ICD-10-CM

## 2015-08-11 HISTORY — DX: Calculus of kidney: N20.0

## 2015-08-11 NOTE — Progress Notes (Signed)
11:05 PM   Alex Lang 1936-01-29 [id number]  Referring provider: No referring provider defined for this encounter.  Chief Complaint  Patient presents with  . Follow-up    2 week recheck after ESWL, KUB done before appt.    HPI: Alex Lang is a 79 year old white male who presents today for follow-up after undergoing ESWL for a right renal stone.  His postprocedural course has been uneventful. He experienced minimal pain. He did not have gross hematuria, fever, nausea, or vomiting. He has passed several fragments that he brings that with him today.    He is so pleased with his results of the ESWL on the right, he is anxious to address the stones in his left kidney with the same procedure.  PREVIOUS HISTORY: HPI Comments: Alex Lang is a 79 year old white male with a history of nephrolithiasis who has been experiencing right flank pain for the last four days. He states the pain has been quite constant since its onset. He describes it as a dull ache. His wife states he has been quite irritable and that is out of character for him. The pain is independent of movement or time of day. He does not complain of fevers, chills, nausea, vomiting or blood in the urine.   A CT scan performed in 2013 noted bilateral nephrolithiasis.  Patient also has cT2a Prostate cancer- Dx in 10/2014 with Gleason 3+4, 3+3 prostate cancer involving 8 of 13 cores, iPSA 4.8 DRE also somewhat concerning for cancer with questionable induration at right base. TRUS vol 45 cc. He underwent I-125 brachytherapy seed placement on 01/26/2015 by Alex Lang. He is also receiving adjunctive ADT therapy for 4-6 months. He is currently being followed by Alex Lang and will see seeing him soon for an office visit where they will obtain a PSA.  His was also seen recently at [facility] [facility] and was instructed to follow up in 6 months with them.  His current PSA was 0.04 ng/mL on 06/23/2015.     PMH: Past Medical History  Diagnosis Date  . Kidney stone     bilateral  . Prostate cancer   . Hypertension   . Acid reflux   . Nephrolithiasis 2016  . GERD (gastroesophageal reflux disease)   . Erectile dysfunction   . Elevated PSA   . Microscopic hematuria   . Hypogonadism in male   . BPH (benign prostatic hyperplasia)   . Gross hematuria     Surgical History: Past Surgical History  Procedure Laterality Date  . Prostate surgery      seeds  . Kidney stone surgery    . Extracorporeal shockwave lithotripsy  2016  . Laser lithotripsy and stent placement  2005  . Orthopedic surgery      Home Medications:    Medication List       This list is accurate as of: 08/11/15 11:05 PM.  Always use your most recent med list.               benazepril 5 MG tablet  Commonly known as:  LOTENSIN  Take 5 mg by mouth 2 (two) times daily.     CALCIUM 600 600 MG Tabs tablet  Generic drug:  calcium carbonate  Take by mouth.     FISH OIL + D3 PO  Take 1,200 mg by mouth daily.     Ginkgo Biloba 40 MG Tabs  Take 120 mg by mouth daily.  HYDROcodone-acetaminophen 5-325 MG per tablet  Commonly known as:  NORCO/VICODIN  Take 1-2 tablets by mouth every 6 (six) hours as needed for moderate pain.     ibuprofen 200 MG tablet  Commonly known as:  ADVIL,MOTRIN  Take 200 mg by mouth daily.     Lysine 500 MG Caps  Take 500 mg by mouth 2 (two) times daily.     omeprazole 20 MG capsule  Commonly known as:  PRILOSEC  Take 20 mg by mouth 2 (two) times daily before a meal.     tamsulosin 0.4 MG Caps capsule  Commonly known as:  FLOMAX  Take 0.4 mg by mouth daily.     Vitamin D3 2000 UNITS capsule  Take 2,000 Units by mouth daily.        Allergies:  Allergies  Allergen Reactions  . Other Nausea And Vomiting    Darvocet    Family History: Family History  Problem Relation Age of Onset  . Hypertension Mother   . Cancer Maternal Grandfather   . Cancer Brother   .  Prostate cancer Brother   . Kidney disease Neg Hx     Social History:  reports that he has never smoked. He has never used smokeless tobacco. He reports that he does not drink alcohol or use illicit drugs.  ROS: UROLOGY Frequent Urination?: No Hard to postpone urination?: No Burning/pain with urination?: No Get up at night to urinate?: No Leakage of urine?: No Urine stream starts and stops?: No Trouble starting stream?: No Do you have to strain to urinate?: No Blood in urine?: No Urinary tract infection?: No Sexually transmitted disease?: No Injury to kidneys or bladder?: No Painful intercourse?: No Weak stream?: No Erection problems?: No Penile pain?: No  Gastrointestinal Nausea?: No Vomiting?: No Indigestion/heartburn?: No Diarrhea?: No Constipation?: No  Constitutional Fever: No Night sweats?: No Weight loss?: No Fatigue?: No  Skin Skin rash/lesions?: No Itching?: No  Eyes Blurred vision?: No Double vision?: No  Ears/Nose/Throat Sore throat?: No Sinus problems?: No  Hematologic/Lymphatic Swollen glands?: No Easy bruising?: No  Cardiovascular Leg swelling?: No Chest pain?: No  Respiratory Cough?: No Shortness of breath?: No  Endocrine Excessive thirst?: No  Musculoskeletal Back pain?: No Joint pain?: No  Neurological Headaches?: No Dizziness?: No  Psychologic Depression?: No Anxiety?: No  Physical Exam: Blood pressure 186/79, pulse 55, height 5\' 9"  (1.753 m), weight 202 lb 6.4 oz (91.808 kg).  Laboratory Data:  Lab Results  Component Value Date   WBC 3.6* 06/23/2015   HGB 11.8* 06/23/2015   HCT 33.8* 06/23/2015   MCV 85.8 06/23/2015   PLT 173 06/23/2015    Lab Results  Component Value Date   CREATININE 1.25* 06/23/2015    Lab Results  Component Value Date   PSA 0.04 06/23/2015   PSA 0.2 03/16/2015   PSA 5.1* 12/15/2014    Pertinent Imaging: CLINICAL DATA: Right flank pain and gross hematuria 2 weeks  ago, history of renal calculi  EXAM: CT ABDOMEN AND PELVIS WITHOUT CONTRAST  TECHNIQUE: Multidetector CT imaging of the abdomen and pelvis was performed following the standard protocol without IV contrast.  COMPARISON: 10/23/2012, abdominal radiographs 06/18/2015  FINDINGS: Lower chest: Lung bases are clear. No pleural effusion.  Hepatobiliary: Unenhanced liver and gallbladder are unremarkable with the exception of a stable 1.3 cm hypodense lesion lateral segment left hepatic lobe image 21, with previous measurements of fluid density likely a cyst. Suggestion of fatty liver with sparing about the gallbladder fossa.  Pancreas:  Normal  Spleen: Normal  Adrenals/Urinary Tract: Bilateral nonobstructing renal calculi are identified, largest left mid kidney 1.2 cm image 40 and largest right lower renal pole 1.0 cm image 40. Bilateral lower pole renal cortical cysts are noted, largest left lower renal pole measuring 3.0 cm image 45. No hydroureteronephrosis. Adrenal glands are unremarkable. The bladder is unremarkable. 2 mm distal right ureteral calculus is identified image 80.  Stomach/Bowel: Appendix is normal. No bowel wall thickening or focal segmental dilatation is identified.  Vascular/Lymphatic: No lymphadenopathy. Mild atheromatous aortic calcification without aneurysm.  Other: Small right and trace left fat containing inguinal hernias are noted. No free air or fluid. Prostatic brachytherapy seeds.  Musculoskeletal: Incidental note is made of asymmetric atrophy of the upper right lower extremity musculature which may be seen with polio or other neuromuscular disorders. Multilevel disc degenerative change noted in the spine.  IMPRESSION: 2 mm nonobstructing distal right ureteral calculus.  Nonobstructing bilateral renal calculi.   Electronically Signed  By: Conchita Paris M.D.  On: 07/06/2015 13:30  CLINICAL DATA: Lithotripsy 3 weeks  ago.  EXAM: ABDOMEN - 1 VIEW  COMPARISON: 07/23/2015  FINDINGS: The bowel gas pattern is normal. Several radiopaque structures are noted overlying the left renal shadow which may represent renal calculi. The previously seen right renal calculus is no longer appreciated. Prostate radiation seeds is seen. Osteoarthritic changes of the lumbosacral spine are noted.  IMPRESSION: Interval resolution of right renal calculus.  Probable residual left renal calculi.   Electronically Signed  By: Fidela Salisbury M.D.  On: 08/11/2015 15:43  Assessment and Plan:   1. Kidney stones: Bilateral stones are identified on recent KUB (06/18/2015).  The CT scan performed on 07/06/2015 noted a 2 mm right ureteral stone, which the patient has passed.  He and his wife would like definitive treatment for the remaining stones.   He has completed ESWL for the right-sided stone. He is anxious to have the left side addressed. I will obtain a renal ultrasound and if no hydronephrosis is present, we will proceed with ESWL on the left kidney.  2. Prostate cancer: cT2a Prostate cancer- Dx in 10/2014 with Gleason 3+4, 3+3 prostate cancer involving 8 of 13 cores, iPSA 4.8 DRE also somewhat concerning for cancer with questionable induration at right base. TRUS vol 45 cc. He underwent I-125 brachytherapy seed placement on 01/26/2015 by Alex Lang. He is also receiving adjunctive ADT therapy for 4-6 months. He is currently being followed by Cancer center and will see seeing him in July for an office visit where they will obtain a PSA.  PSA was 0.004 ng/mL on 06/23/2015.  The Cancer center will see the patient again in 6 months.     Return for I will call with report.  Zara Council, [facility] [address], [place], [place] [address] [phone]

## 2015-08-20 NOTE — Telephone Encounter (Signed)
Patient's stone analysis has returned.  His stone was mostly made of uric acid.

## 2015-08-26 ENCOUNTER — Other Ambulatory Visit: Payer: Self-pay | Admitting: Urology

## 2015-08-31 ENCOUNTER — Encounter: Payer: Self-pay | Admitting: Urology

## 2015-08-31 ENCOUNTER — Telehealth: Payer: Self-pay

## 2015-08-31 ENCOUNTER — Ambulatory Visit
Admission: RE | Admit: 2015-08-31 | Discharge: 2015-08-31 | Disposition: A | Discharge status: Home / Self Care | Payer: Medicare Other | Source: Ambulatory Visit | Attending: Urology | Admitting: Urology

## 2015-08-31 DIAGNOSIS — N281 Cyst of kidney, acquired: Secondary | ICD-10-CM | POA: Insufficient documentation

## 2015-08-31 DIAGNOSIS — N200 Calculus of kidney: Secondary | ICD-10-CM | POA: Diagnosis present

## 2015-08-31 NOTE — Telephone Encounter (Signed)
-----   Message from Nori Riis, PA-C sent at 08/31/2015  1:20 PM EDT ----- RUS is good.  Schedule left ESWL.

## 2015-08-31 NOTE — Telephone Encounter (Signed)
-----   Message from Nori Riis, PA-C sent at 08/30/2015  8:30 PM EDT ----- Patient needs a follow-up visit after his renal ultrasound which is scheduled for the 12th and we will discuss his stone analysis at that time.

## 2015-09-01 ENCOUNTER — Ambulatory Visit: Payer: Medicare Other

## 2015-09-03 ENCOUNTER — Encounter
Admission: RE | Disposition: A | Discharge status: Home / Self Care | Payer: Self-pay | Source: Ambulatory Visit | Attending: Urology

## 2015-09-03 ENCOUNTER — Other Ambulatory Visit: Payer: Self-pay | Admitting: Urology

## 2015-09-03 ENCOUNTER — Ambulatory Visit: Payer: Medicare Other

## 2015-09-03 ENCOUNTER — Encounter: Payer: Self-pay | Admitting: *Deleted

## 2015-09-03 ENCOUNTER — Ambulatory Visit
Admission: RE | Admit: 2015-09-03 | Discharge: 2015-09-03 | Disposition: A | Discharge status: Home / Self Care | Payer: Medicare Other | Source: Ambulatory Visit | Attending: Urology | Admitting: Urology

## 2015-09-03 DIAGNOSIS — Z79899 Other long term (current) drug therapy: Secondary | ICD-10-CM | POA: Insufficient documentation

## 2015-09-03 DIAGNOSIS — Z888 Allergy status to other drugs, medicaments and biological substances status: Secondary | ICD-10-CM | POA: Insufficient documentation

## 2015-09-03 DIAGNOSIS — N200 Calculus of kidney: Secondary | ICD-10-CM

## 2015-09-03 DIAGNOSIS — K219 Gastro-esophageal reflux disease without esophagitis: Secondary | ICD-10-CM | POA: Insufficient documentation

## 2015-09-03 DIAGNOSIS — I10 Essential (primary) hypertension: Secondary | ICD-10-CM | POA: Diagnosis not present

## 2015-09-03 HISTORY — PX: EXTRACORPOREAL SHOCK WAVE LITHOTRIPSY: SHX1557

## 2015-09-03 SURGERY — LITHOTRIPSY, ESWL
Anesthesia: Moderate Sedation | Laterality: Left

## 2015-09-03 MED ORDER — TRAMADOL HCL 50 MG PO TABS: 50.0000 mg | ORAL_TABLET | Freq: Four times a day (QID) | ORAL | Status: DC | PRN

## 2015-09-03 MED ORDER — DEXTROSE-NACL 5-0.45 % IV SOLN
INTRAVENOUS | Status: DC
  Administered 2015-09-03: 07:00:00 via INTRAVENOUS

## 2015-09-03 MED ORDER — MORPHINE SULFATE (PF) 4 MG/ML IV SOLN: 6.0000 mg | Freq: Once | INTRAVENOUS | Status: DC

## 2015-09-03 MED ORDER — MORPHINE SULFATE (PF) 10 MG/ML IV SOLN
INTRAVENOUS | Status: AC
  Administered 2015-09-03: 10 mg
  Filled 2015-09-03: qty 1

## 2015-09-03 MED ORDER — PROMETHAZINE HCL 25 MG/ML IJ SOLN
INTRAMUSCULAR | Status: AC
  Administered 2015-09-03: 25 mg via INTRAMUSCULAR
  Filled 2015-09-03: qty 1

## 2015-09-03 MED ORDER — LEVOFLOXACIN 500 MG PO TABS
500.0000 mg | ORAL_TABLET | Freq: Once | ORAL | Status: AC
  Administered 2015-09-03: 500 mg via ORAL

## 2015-09-03 MED ORDER — PROMETHAZINE HCL 25 MG/ML IJ SOLN
25.0000 mg | Freq: Once | INTRAMUSCULAR | Status: AC
  Administered 2015-09-03: 25 mg via INTRAMUSCULAR

## 2015-09-03 MED ORDER — LEVOFLOXACIN 500 MG PO TABS
ORAL_TABLET | ORAL | Status: AC
  Administered 2015-09-03: 500 mg via ORAL
  Filled 2015-09-03: qty 1

## 2015-09-03 NOTE — Discharge Instructions (Signed)
Lithotripsy for Kidney Stones °Lithotripsy is a treatment that can sometimes help eliminate kidney stones and pain that they cause. A form of lithotripsy, also known as extracorporeal shock wave lithotripsy, is a nonsurgical procedure that helps your body rid itself of the kidney stone when it is too big to pass on its own. Extracorporeal shock wave lithotripsy is a method of crushing a kidney stone with shock waves. These shock waves pass through your body and are focused on your stone. They cause the kidney stones to crumble while still in the urinary tract. It is then easier for the smaller pieces of stone to pass in the urine. °Lithotripsy usually takes about an hour. It is done in a hospital, a lithotripsy center, or a mobile unit. It usually does not require an overnight stay. Your health care provider will instruct you on preparation for the procedure. Your health care provider will tell you what to expect afterward. °LET YOUR HEALTH CARE PROVIDER KNOW ABOUT: °· Any allergies you have. °· All medicines you are taking, including vitamins, herbs, eye drops, creams, and over-the-counter medicines. °· Previous problems you or members of your family have had with the use of anesthetics. °· Any blood disorders you have. °· Previous surgeries you have had. °· Medical conditions you have. °RISKS AND COMPLICATIONS °Generally, lithotripsy for kidney stones is a safe procedure. However, as with any procedure, complications can occur. Possible complications include: °· Infection. °· Bleeding of the kidney. °· Bruising of the kidney or skin. °· Obstruction of the ureter. °· Failure of the stone to fragment. °BEFORE THE PROCEDURE °· Do not eat or drink for 6-8 hours prior to the procedure. You may, however, take the medications with a sip of water that your physician instructs you to take °· Do not take aspirin or aspirin-containing products for 7 days prior to your procedure °· Do not take nonsteroidal anti-inflammatory  products for 7 days prior to your procedure °PROCEDURE °A stent (flexible tube with holes) may be placed in your ureter. The ureter is the tube that transports the urine from the kidneys to the bladder. Your health care provider may place a stent before the procedure. This will help keep urine flowing from the kidney if the fragments of the stone block the ureter. You may have an IV tube placed in one of your veins to give you fluids and medicines. These medicines may help you relax or make you sleep. During the procedure, you will lie comfortably on a fluid-filled cushion or in a warm-water bath. After an X-ray or ultrasound exam to locate your stone, shock waves are aimed at the stone. If you are awake, you may feel a tapping sensation as the shock waves pass through your body. If large stone particles remain after treatment, a second procedure may be necessary at a later date. °For comfort during the test: °· Relax as much as possible. °· Try to remain still as much as possible. °· Try to follow instructions to speed up the test. °· Let your health care provider know if you are uncomfortable, anxious, or in pain. °AFTER THE PROCEDURE  °After surgery, you will be taken to the recovery area. A nurse will watch and check your progress. Once you're awake, stable, and taking fluids well, you will be allowed to go home as long as there are no problems. You will also be allowed to pass your urine before discharge. You may be given antibiotics to help prevent infection. You may also be prescribed   pain medicine if needed. In a week or two, your health care provider may remove your stent, if you have one. You may first have an X-ray exam to check on how successful the fragmentation of your stone has been and how much of the stone has passed. Your health care provider will check to see whether or not stone particles remain. SEEK IMMEDIATE MEDICAL CARE IF:  You develop a fever or shaking chills.  Your pain is not  relieved by medicine.  You feel sick to your stomach (nauseated) and you vomit.  You develop heavy bleeding.  You have difficulty urinating.  You start to pass your stent from your penis. Document Released: 12/02/2000 Document Revised: 09/25/2013 Document Reviewed: 06/20/2013 [facility] Patient Information 2015 [place], [place]. This information is not intended to replace advice given to you by your health care provider. Make sure you discuss any questions you have with your health care provider. AMBULATORY SURGERY  DISCHARGE INSTRUCTIONS   1) The drugs that you were given will stay in your system until tomorrow so for the next 24 hours you should not:  A) Drive an automobile B) Make any legal decisions C) Drink any alcoholic beverage   2) You may resume regular meals tomorrow.  Today it is better to start with liquids and gradually work up to solid foods.  You may eat anything you prefer, but it is better to start with liquids, then soup and crackers, and gradually work up to solid foods.   3) Please notify your doctor immediately if you have any unusual bleeding, trouble breathing, redness and pain at the surgery site, drainage, fever, or pain not relieved by medication.    4) Additional Instructions:        Please contact your physician with any problems or Same Day Surgery at [phone], Monday through Friday 6 am to 4 pm, or [facility] [place] [facility] number at [phone].AMBULATORY SURGERY  DISCHARGE INSTRUCTIONS   5) The drugs that you were given will stay in your system until tomorrow so for the next 24 hours you should not:  D) Drive an automobile E) Make any legal decisions F) Drink any alcoholic beverage   6) You may resume regular meals tomorrow.  Today it is better to start with liquids and gradually work up to solid foods.  You may eat anything you prefer, but it is better to start with liquids, then soup and crackers, and gradually work up to  solid foods.   7) Please notify your doctor immediately if you have any unusual bleeding, trouble breathing, redness and pain at the surgery site, drainage, fever, or pain not relieved by medication.    8) Additional Instructions:        Please contact your physician with any problems or Same Day Surgery at [phone], Monday through Friday 6 am to 4 pm, or [place] [facility] number at [phone].

## 2015-09-07 NOTE — Telephone Encounter (Signed)
Pt had ESWL on 09/03/15. Previous message addressed.

## 2015-09-17 ENCOUNTER — Other Ambulatory Visit: Payer: Self-pay

## 2015-09-17 ENCOUNTER — Ambulatory Visit
Admission: RE | Admit: 2015-09-17 | Discharge: 2015-09-17 | Disposition: A | Discharge status: Home / Self Care | Payer: Medicare Other | Source: Ambulatory Visit | Attending: Obstetrics and Gynecology | Admitting: Obstetrics and Gynecology

## 2015-09-17 ENCOUNTER — Encounter: Payer: Self-pay | Admitting: Obstetrics and Gynecology

## 2015-09-17 ENCOUNTER — Ambulatory Visit (INDEPENDENT_AMBULATORY_CARE_PROVIDER_SITE_OTHER): Payer: Medicare Other | Admitting: Obstetrics and Gynecology

## 2015-09-17 VITALS — BP 113/69 | HR 64 | Ht 69.0 in | Wt 203.4 lb

## 2015-09-17 DIAGNOSIS — N200 Calculus of kidney: Secondary | ICD-10-CM

## 2015-09-17 NOTE — Progress Notes (Signed)
09/17/2015 1:28 PM   [name] 05-25-1936 [id number]  Referring provider: Madelyn Brunner, MD [place], [place] [address]  Chief Complaint  Patient presents with  . Follow-up    lithotripsy, KUB    HPI: [name] is a 79 year old white male who presents today for follow-up after undergoing ESWL for left renal stones.  His postprocedural course has been uneventful. He experienced minimal pain. He did not have gross hematuria, fever, nausea, or vomiting. He has passed several fragments that he brings that with him today.   Patient states that he has has started taking potassium citrate again as instructed by Dr. Elnoria Howard. He states that he has medication before but was told to stop taking it by his oncologist Dr. Verlin Dike but he is unsure why.  PREVIOUS HISTORY: HPI Comments: [name] is a 79 year old white male with a history of nephrolithiasis who has been experiencing right flank pain for the last four days. He states the pain has been quite constant since its onset. He describes it as a dull ache. His wife states he has been quite irritable and that is out of character for him. The pain is independent of movement or time of day. He does not complain of fevers, chills, nausea, vomiting or blood in the urine.   A CT scan performed in 2013 noted bilateral nephrolithiasis.  Patient also has cT2a Prostate cancer- Dx in 10/2014 with Gleason 3+4, 3+3 prostate cancer involving 8 of 13 cores, iPSA 4.8 DRE also somewhat concerning for cancer with questionable induration at right base. TRUS vol 45 cc. He underwent I-125 brachytherapy seed placement on 01/26/2015 by Dr. Hollice Espy. He is also receiving adjunctive ADT therapy for 4-6 months. He is currently being followed by Dr. Donella Stade and will see seeing him soon for an office visit where they will obtain a PSA.  His was also seen recently at [facility] and was instructed to follow up  in 6 months with them. His current PSA was 0.04 ng/mL on 06/23/2015.   PMH: Past Medical History  Diagnosis Date  . Kidney stone     bilateral  . Prostate cancer   . Hypertension   . Acid reflux   . Nephrolithiasis 2016  . GERD (gastroesophageal reflux disease)   . Erectile dysfunction   . Elevated PSA   . Microscopic hematuria   . Hypogonadism in male   . BPH (benign prostatic hyperplasia)   . Gross hematuria   . BP (high blood pressure) 10/02/2014  . Kidney stones 06/21/2015  . Renal stones 08/11/2015    Surgical History: Past Surgical History  Procedure Laterality Date  . Prostate surgery      seeds  . Kidney stone surgery    . Extracorporeal shockwave lithotripsy  2016  . Laser lithotripsy and stent placement  2005  . Orthopedic surgery    . Extracorporeal shock wave lithotripsy Right 07/23/2015    Procedure: EXTRACORPOREAL SHOCK WAVE LITHOTRIPSY (ESWL);  Surgeon: Hollice Espy, MD;  Location: [facility] ORS;  Service: Urology;  Laterality: Right;  . Extracorporeal shock wave lithotripsy Left 09/03/2015    Procedure: EXTRACORPOREAL SHOCK WAVE LITHOTRIPSY (ESWL);  Surgeon: Nickie Retort, MD;  Location: [facility] ORS;  Service: Urology;  Laterality: Left;    Home Medications:    Medication List       This list is accurate as of: 09/17/15  1:28 PM.  Always use your most recent med list.  benazepril 5 MG tablet  Commonly known as:  LOTENSIN  Take 5 mg by mouth 2 (two) times daily.     CALCIUM 600 600 MG Tabs tablet  Generic drug:  calcium carbonate  Take by mouth.     FISH OIL + D3 PO  Take 1,200 mg by mouth daily.     Ginkgo Biloba 40 MG Tabs  Take 120 mg by mouth daily.     HYDROcodone-acetaminophen 5-325 MG tablet  Commonly known as:  NORCO/VICODIN  Take 1-2 tablets by mouth every 6 (six) hours as needed for moderate pain.     ibuprofen 200 MG tablet  Commonly known as:  ADVIL,MOTRIN  Take 200 mg by mouth daily.     Lysine 500 MG Caps    Take 500 mg by mouth 2 (two) times daily.     omeprazole 20 MG capsule  Commonly known as:  PRILOSEC  Take 20 mg by mouth 2 (two) times daily before a meal.     tamsulosin 0.4 MG Caps capsule  Commonly known as:  FLOMAX  Take 0.4 mg by mouth daily.     traMADol 50 MG tablet  Commonly known as:  ULTRAM  Take 1 tablet (50 mg total) by mouth every 6 (six) hours as needed (2 tablets up to 3 times daily).     Vitamin D3 2000 UNITS capsule  Take 2,000 Units by mouth daily.        Allergies:  Allergies  Allergen Reactions  . Other Nausea And Vomiting    Darvocet  . Propoxyphene Nausea And Vomiting    Family History: Family History  Problem Relation Age of Onset  . Hypertension Mother   . Cancer Maternal Grandfather   . Cancer Brother   . Prostate cancer Brother   . Kidney disease Neg Hx     Social History:  reports that he has never smoked. He has never used smokeless tobacco. He reports that he does not drink alcohol or use illicit drugs.  ROS: UROLOGY Frequent Urination?: No Hard to postpone urination?: No Burning/pain with urination?: No Get up at night to urinate?: No Leakage of urine?: No Urine stream starts and stops?: No Trouble starting stream?: No Do you have to strain to urinate?: No Blood in urine?: No Urinary tract infection?: No Sexually transmitted disease?: No Injury to kidneys or bladder?: No Painful intercourse?: No Weak stream?: No Erection problems?: No Penile pain?: No  Gastrointestinal Nausea?: No Vomiting?: No Indigestion/heartburn?: No Diarrhea?: No Constipation?: No  Constitutional Fever: No Night sweats?: No Weight loss?: No Fatigue?: No  Skin Skin rash/lesions?: No Itching?: No  Eyes Blurred vision?: No Double vision?: No  Ears/Nose/Throat Sore throat?: No Sinus problems?: No  Hematologic/Lymphatic Swollen glands?: No Easy bruising?: No  Cardiovascular Leg swelling?: No Chest pain?:  No  Respiratory Cough?: No Shortness of breath?: No  Endocrine Excessive thirst?: No  Musculoskeletal Back pain?: No Joint pain?: No  Neurological Headaches?: No Dizziness?: No  Psychologic Depression?: No Anxiety?: No  Physical Exam: BP 113/69 mmHg  Pulse 64  Ht 5\' 9"  (1.753 m)  Wt 203 lb 6.4 oz (92.262 kg)  BMI 30.02 kg/m2  Constitutional:  Alert and oriented, No acute distress. HEENT: [place] AT, moist mucus membranes.  Trachea midline, no masses. Cardiovascular: No clubbing, cyanosis, or edema. Respiratory: Normal respiratory effort, no increased work of breathing. GI: Abdomen is soft, nontender, nondistended, no abdominal masses GU: No CVA tenderness.  Skin: No rashes, bruises or suspicious lesions. Lymph: No  cervical or inguinal adenopathy. Neurologic: Grossly intact, no focal deficits, moving all 4 extremities. Psychiatric: Normal mood and affect.  Laboratory Data: Lab Results  Component Value Date   WBC 3.6* 06/23/2015   HGB 11.8* 06/23/2015   HCT 33.8* 06/23/2015   MCV 85.8 06/23/2015   PLT 173 06/23/2015    Lab Results  Component Value Date   CREATININE 1.25* 06/23/2015    Lab Results  Component Value Date   PSA 0.04 06/23/2015   PSA 0.2 03/16/2015   PSA 5.1* 12/15/2014    No results found for: TESTOSTERONE  No results found for: HGBA1C  Urinalysis    Component Value Date/Time   GLUCOSEU Negative 07/16/2015 1057   BILIRUBINUR Negative 07/16/2015 1057   NITRITE Negative 07/16/2015 1057   LEUKOCYTESUR Negative 07/16/2015 1057    Pertinent Imaging:   Assessment & Plan:    1. Renal stones-  KUB was reviewed with patient and wife. Larger left renal stone is not seen on KUB and questionable dust image of smaller stone visible. Awaiting official read by radiologist.  I reviewed his previous stone analysis with patient today. Stone Composition 70% Uric Acid and 30% Calcium Oxalate Monohydrate. Patient has been taking potassium citrate as  instructed by Dr. Elnoria Howard. This medication would be useful in dissolving uric acid stones and preventing stone development in the future. I am unsure why patient's oncologist Dr. Verlin Dike instructed him previously to stop this medication. I encouraged patient to call Dr. Verlin Dike and inquire as to why he should not be taking it. 24 hour urinalysis ordered today. Patient will return in approximately 4 weeks for follow-up to review 24 urinalysis and renal ultrasound results.  - Urinalysis, Complete  Return in about 4 weeks (around 10/15/2015) for renal US results/24hr urine.  Herbert Moors, [facility] [address], [place], [place] [address] [phone]

## 2015-09-21 ENCOUNTER — Telehealth: Payer: Self-pay | Admitting: *Deleted

## 2015-09-21 LAB — URINALYSIS, COMPLETE
BILIRUBIN UA: NEGATIVE
Glucose, UA: NEGATIVE
KETONES UA: NEGATIVE
Leukocytes, UA: NEGATIVE
NITRITE UA: NEGATIVE
Protein, UA: NEGATIVE
RBC, UA: NEGATIVE
SPEC GRAV UA: 1.01 (ref 1.005–1.030)
UUROB: 0.2 mg/dL (ref 0.2–1.0)
pH, UA: 6 (ref 5.0–7.5)

## 2015-09-21 LAB — MICROSCOPIC EXAMINATION
RBC MICROSCOPIC, UA: NONE SEEN /HPF (ref 0–?)
Renal Epithel, UA: NONE SEEN /hpf

## 2015-09-21 NOTE — Telephone Encounter (Signed)
Per Dr Oliva Bustard, ok to go on  Potassium citrate, informed wife

## 2015-09-21 NOTE — Telephone Encounter (Signed)
Used to be on potassium citrate, but Dr Oliva Bustard took him off of it and Urologist is wanting him to go back on it for his kidney stones. Asking if there is any reason he should not go back on it

## 2015-09-29 ENCOUNTER — Other Ambulatory Visit: Payer: Medicare Other

## 2015-10-05 ENCOUNTER — Telehealth: Payer: Self-pay | Admitting: Obstetrics and Gynecology

## 2015-10-05 NOTE — Telephone Encounter (Signed)
Pt's wife called and wants the nurse to call them back.  1)  They have not received a call from anyone yet regarding the pt's 24 hour urine they dropped off last week, 2)  They received another 24 hour urine kit in the mail on Saturday and want to make sure that they do not get charged for this.  Please call.

## 2015-10-05 NOTE — Telephone Encounter (Signed)
Spoke with pt wife in reference to litholink. Made wife aware it takes 3-4+ weeks to get litholink results. In reference to the extra kit being sent to pt nurse advised wife to call litholink. Wife voiced understanding.  

## 2015-10-06 ENCOUNTER — Other Ambulatory Visit: Payer: Self-pay | Admitting: Obstetrics and Gynecology

## 2015-10-12 ENCOUNTER — Ambulatory Visit
Admission: RE | Admit: 2015-10-12 | Discharge: 2015-10-12 | Disposition: A | Discharge status: Home / Self Care | Payer: Medicare Other | Source: Ambulatory Visit | Attending: Obstetrics and Gynecology | Admitting: Obstetrics and Gynecology

## 2015-10-12 DIAGNOSIS — N281 Cyst of kidney, acquired: Secondary | ICD-10-CM | POA: Diagnosis not present

## 2015-10-12 DIAGNOSIS — N200 Calculus of kidney: Secondary | ICD-10-CM | POA: Diagnosis present

## 2015-10-12 DIAGNOSIS — E119 Type 2 diabetes mellitus without complications: Secondary | ICD-10-CM | POA: Insufficient documentation

## 2015-10-15 ENCOUNTER — Other Ambulatory Visit: Payer: Medicare Other

## 2015-10-19 ENCOUNTER — Encounter: Payer: Self-pay | Admitting: Obstetrics and Gynecology

## 2015-10-19 ENCOUNTER — Ambulatory Visit (INDEPENDENT_AMBULATORY_CARE_PROVIDER_SITE_OTHER): Payer: Medicare Other | Admitting: Obstetrics and Gynecology

## 2015-10-19 VITALS — BP 168/77 | HR 57 | Resp 16 | Ht 69.0 in | Wt 206.6 lb

## 2015-10-19 DIAGNOSIS — N200 Calculus of kidney: Secondary | ICD-10-CM | POA: Diagnosis not present

## 2015-10-19 NOTE — Progress Notes (Signed)
10/19/2015 10:23 AM   Alex Lang August 11, 1936 [id number]  Referring provider: Madelyn Brunner, MD [place] [facility] [place], [place] [address]  Chief Complaint  Patient presents with  . Results    RUS  . Nephrolithiasis    HPI: Patient is 79yo male with a history of renal stones and prostate cancer. 4 weeks s/p ESWL for left renal stone.  Patient reports that he passed a small stone a few days ago.  No pain or other urinary complaints.  A 24hr urinalysis was ordered at his last visit. Patient's wife reports that they did not put in preservative correctly with the initial urine collection.  A repeat study was performed but we do not have results yet.  RUS normal.  IMPRESSION: 1. The right kidney exhibits a simple appearing lower pole cyst. No stones are evident. There is no hydronephrosis. 2. The left kidney exhibits a partially septated lower pole cyst. No stones are evident. There is no hydronephrosis. 3. The urinary bladder is unremarkable.   PREVIOUS HISTORY: HPI Comments: Mr. Makki is a 79 year old white male with a history of nephrolithiasis who has been experiencing right flank pain for the last four days. He states the pain has been quite constant since its onset. He describes it as a dull ache. His wife states he has been quite irritable and that is out of character for him. The pain is independent of movement or time of day. He does not complain of fevers, chills, nausea, vomiting or blood in the urine.   A CT scan performed in 2013 noted bilateral nephrolithiasis.  Patient also has cT2a Prostate cancer- Dx in 10/2014 with Gleason 3+4, 3+3 prostate cancer involving 8 of 13 cores, iPSA 4.8 DRE also somewhat concerning for cancer with questionable induration at right base. TRUS vol 45 cc. He underwent I-125 brachytherapy seed placement on 01/26/2015 by Dr. Hollice Espy. He is also receiving adjunctive ADT therapy for 4-6 months. He  is currently being followed by Dr. Donella Stade and will see seeing him soon for an office visit where they will obtain a PSA.  His was also seen recently at [facility] and was instructed to follow up in 6 months with them. His current PSA was 0.04 ng/mL on 06/23/2015.   PMH: Past Medical History  Diagnosis Date  . Kidney stone     bilateral  . Prostate cancer ([place])   . Hypertension   . Acid reflux   . Nephrolithiasis 2016  . GERD (gastroesophageal reflux disease)   . Erectile dysfunction   . Elevated PSA   . Microscopic hematuria   . Hypogonadism in male   . BPH (benign prostatic hyperplasia)   . Gross hematuria   . BP (high blood pressure) 10/02/2014  . Kidney stones 06/21/2015  . Renal stones 08/11/2015    Surgical History: Past Surgical History  Procedure Laterality Date  . Prostate surgery      seeds  . Kidney stone surgery    . Extracorporeal shockwave lithotripsy  2016  . Laser lithotripsy and stent placement  2005  . Orthopedic surgery    . Extracorporeal shock wave lithotripsy Right 07/23/2015    Procedure: EXTRACORPOREAL SHOCK WAVE LITHOTRIPSY (ESWL);  Surgeon: Hollice Espy, MD;  Location: [facility] ORS;  Service: Urology;  Laterality: Right;  . Extracorporeal shock wave lithotripsy Left 09/03/2015    Procedure: EXTRACORPOREAL SHOCK WAVE LITHOTRIPSY (ESWL);  Surgeon: Nickie Retort, MD;  Location: [facility] ORS;  Service:  Urology;  Laterality: Left;    Home Medications:    Medication List       This list is accurate as of: 10/19/15 10:23 AM.  Always use your most recent med list.               benazepril 5 MG tablet  Commonly known as:  LOTENSIN  Take 5 mg by mouth 2 (two) times daily.     CALCIUM 600 600 MG Tabs tablet  Generic drug:  calcium carbonate  Take by mouth.     FISH OIL + D3 PO  Take 1,200 mg by mouth daily.     Ginkgo Biloba 40 MG Tabs  Take 120 mg by mouth daily.     ibuprofen 200 MG tablet  Commonly known as:  ADVIL,MOTRIN   Take 200 mg by mouth daily.     Lysine 500 MG Caps  Take 500 mg by mouth 2 (two) times daily.     omeprazole 20 MG capsule  Commonly known as:  PRILOSEC  Take 20 mg by mouth 2 (two) times daily before a meal.     tamsulosin 0.4 MG Caps capsule  Commonly known as:  FLOMAX  Take 0.4 mg by mouth daily.     Vitamin D3 2000 UNITS capsule  Take 2,000 Units by mouth daily.        Allergies:  Allergies  Allergen Reactions  . Other Nausea And Vomiting    Darvocet  . Propoxyphene Nausea And Vomiting    Family History: Family History  Problem Relation Age of Onset  . Hypertension Mother   . Cancer Maternal Grandfather   . Cancer Brother   . Prostate cancer Brother   . Kidney disease Neg Hx     Social History:  reports that he has never smoked. He has never used smokeless tobacco. He reports that he does not drink alcohol or use illicit drugs.  ROS: UROLOGY Frequent Urination?: No Hard to postpone urination?: No Burning/pain with urination?: No Get up at night to urinate?: No Leakage of urine?: No Urine stream starts and stops?: No Trouble starting stream?: No Do you have to strain to urinate?: No Blood in urine?: No Urinary tract infection?: No Sexually transmitted disease?: No Injury to kidneys or bladder?: No Painful intercourse?: No Weak stream?: No Erection problems?: No Penile pain?: No  Gastrointestinal Nausea?: No Vomiting?: No Indigestion/heartburn?: No Diarrhea?: No Constipation?: No  Constitutional Fever: No Night sweats?: No Weight loss?: No Fatigue?: No  Skin Skin rash/lesions?: No Itching?: No  Eyes Blurred vision?: No Double vision?: No  Ears/Nose/Throat Sore throat?: No Sinus problems?: No  Hematologic/Lymphatic Swollen glands?: No Easy bruising?: No  Cardiovascular Leg swelling?: No Chest pain?: No  Respiratory Cough?: No Shortness of breath?: No  Endocrine Excessive thirst?: No  Musculoskeletal Back pain?:  No Joint pain?: No  Neurological Headaches?: No Dizziness?: No  Psychologic Depression?: No Anxiety?: No  Physical Exam: BP 168/77 mmHg  Pulse 57  Resp 16  Ht 5\' 9"  (1.753 m)  Wt 206 lb 9.6 oz (93.713 kg)  BMI 30.50 kg/m2  Constitutional:  Alert and oriented, No acute distress. HEENT: [place] AT, moist mucus membranes.  Trachea midline, no masses. Cardiovascular: No clubbing, cyanosis, or edema. Respiratory: Normal respiratory effort, no increased work of breathing. Skin: No rashes, bruises or suspicious lesions. Lymph: No cervical or inguinal adenopathy. Neurologic: Grossly intact, no focal deficits, moving all 4 extremities. Psychiatric: Normal mood and affect.  Laboratory Data:   Urinalysis  Component Value Date/Time   GLUCOSEU Negative 09/17/2015 0948   BILIRUBINUR Negative 09/17/2015 0948   NITRITE Negative 09/17/2015 0948   LEUKOCYTESUR Negative 09/17/2015 0948    Pertinent Imaging:   Assessment & Plan:   1. Renal stones-Small residual upper and lower pole left renal calculi were noted on 09/17/15 KUB.  Patient reports that he has continued to pass stone fragments without difficulty.  RUS showing no hydronephrosis. Stone Composition 70% Uric Acid and 30% Calcium Oxalate Monohydrate. Patient has been taking potassium citrate as instructed by Dr. Elnoria Howard. This medication would be useful in dissolving uric acid stones and preventing stone development in the future. I am unsure why patient's oncologist Dr. Verlin Dike instructed him previously to stop this medication. I encouraged patient to call Dr. Verlin Dike and inquire as to why he should not be taking it. 24 hour urinalysis ordered today. Patient will return in approximately 3  weeks for follow-up to review 24 urinalysis.  There are no diagnoses linked to this encounter.  Return in about 3 weeks (around 11/09/2015).  These notes generated with voice recognition software. I apologize for typographical errors.  Herbert Moors, [facility] [address] [place], [place] [address] [phone]

## 2015-10-20 ENCOUNTER — Other Ambulatory Visit: Payer: Self-pay | Admitting: Obstetrics and Gynecology

## 2015-10-22 ENCOUNTER — Other Ambulatory Visit: Payer: Self-pay | Admitting: Obstetrics and Gynecology

## 2015-11-09 ENCOUNTER — Encounter: Payer: Self-pay | Admitting: Obstetrics and Gynecology

## 2015-11-09 ENCOUNTER — Ambulatory Visit (INDEPENDENT_AMBULATORY_CARE_PROVIDER_SITE_OTHER): Payer: Medicare Other | Admitting: Obstetrics and Gynecology

## 2015-11-09 VITALS — BP 162/66 | HR 61 | Ht 69.0 in | Wt 205.3 lb

## 2015-11-09 DIAGNOSIS — N200 Calculus of kidney: Secondary | ICD-10-CM | POA: Diagnosis not present

## 2015-11-09 LAB — URINALYSIS, COMPLETE
BILIRUBIN UA: NEGATIVE
GLUCOSE, UA: NEGATIVE
Ketones, UA: NEGATIVE
Leukocytes, UA: NEGATIVE
Nitrite, UA: NEGATIVE
PROTEIN UA: NEGATIVE
SPEC GRAV UA: 1.02 (ref 1.005–1.030)
UUROB: 0.2 mg/dL (ref 0.2–1.0)
pH, UA: 5 (ref 5.0–7.5)

## 2015-11-09 LAB — MICROSCOPIC EXAMINATION
Bacteria, UA: NONE SEEN
Epithelial Cells (non renal): NONE SEEN /hpf (ref 0–10)
RBC, UA: NONE SEEN /hpf (ref 0–?)

## 2015-11-09 NOTE — Progress Notes (Signed)
11/09/2015 3:01 PM   Alex Lang 1936-08-03 [id number]  Referring provider: Madelyn Brunner, MD [facility] [place], [place] [address]  Chief Complaint  Patient presents with  . Results    HPI:  [name] is a 79 year old white male with a history of nephrolithiasis s/p ESWL for left renal stone approximately 2 months ago.  He does not complain of fevers, chills, nausea, vomiting or blood in the urine. He presents today to discuss the results of his 24hr UA.   RUS normal.  IMPRESSION: 1. The right kidney exhibits a simple appearing lower pole cyst. No stones are evident. There is no hydronephrosis. 2. The left kidney exhibits a partially septated lower pole cyst. No stones are evident. There is no hydronephrosis. 3. The urinary bladder is unremarkable.   A CT scan performed in 2013 noted bilateral nephrolithiasis.  Patient also has cT2a Prostate cancer- Dx in 10/2014 with Gleason 3+4, 3+3 prostate cancer involving 8 of 13 cores, iPSA 4.8 DRE also somewhat concerning for cancer with questionable induration at right base. TRUS vol 45 cc. He underwent I-125 brachytherapy seed placement on 01/26/2015 by Dr. Hollice Lang. He also received adjunctive ADT therapy for 4-6 months. He is currently being followed by Dr. Donella Lang and will see seeing him soon for an office visit where they will obtain a PSA.  He is established with the [facility] for management of his PCa. His current PSA was 0.04 ng/mL on 06/23/2015.    PMH: Past Medical History  Diagnosis Date  . Kidney stone     bilateral  . Prostate cancer ([place])   . Hypertension   . Acid reflux   . Nephrolithiasis 2016  . GERD (gastroesophageal reflux disease)   . Erectile dysfunction   . Elevated PSA   . Microscopic hematuria   . Hypogonadism in male   . BPH (benign prostatic hyperplasia)   . Gross hematuria   . BP (high blood pressure) 10/02/2014  . Kidney stones 06/21/2015   . Renal stones 08/11/2015    Surgical History: Past Surgical History  Procedure Laterality Date  . Prostate surgery      seeds  . Kidney stone surgery    . Extracorporeal shockwave lithotripsy  2016  . Laser lithotripsy and stent placement  2005  . Orthopedic surgery    . Extracorporeal shock wave lithotripsy Right 07/23/2015    Procedure: EXTRACORPOREAL SHOCK WAVE LITHOTRIPSY (ESWL);  Surgeon: Alex Espy, MD;  Location: [facility] ORS;  Service: Urology;  Laterality: Right;  . Extracorporeal shock wave lithotripsy Left 09/03/2015    Procedure: EXTRACORPOREAL SHOCK WAVE LITHOTRIPSY (ESWL);  Surgeon: Alex Retort, MD;  Location: [facility] ORS;  Service: Urology;  Laterality: Left;    Home Medications:    Medication List       This list is accurate as of: 11/09/15  3:01 PM.  Always use your most recent med list.               benazepril 5 MG tablet  Commonly known as:  LOTENSIN  Take 5 mg by mouth 2 (two) times daily.     CALCIUM 600 600 MG Tabs tablet  Generic drug:  calcium carbonate  Take by mouth.     FISH OIL + D3 PO  Take 1,200 mg by mouth daily.     Ginkgo Biloba 40 MG Tabs  Take 120 mg by mouth daily.     ibuprofen 200 MG tablet  Commonly known as:  ADVIL,MOTRIN  Take 200 mg by mouth daily.     Lysine 500 MG Caps  Take 500 mg by mouth 2 (two) times daily.     omeprazole 20 MG capsule  Commonly known as:  PRILOSEC  Take 20 mg by mouth 2 (two) times daily before a meal.     tamsulosin 0.4 MG Caps capsule  Commonly known as:  FLOMAX  Take 0.4 mg by mouth daily.     Vitamin D3 2000 UNITS capsule  Take 2,000 Units by mouth daily.        Allergies:  Allergies  Allergen Reactions  . Other Nausea And Vomiting    Darvocet  . Propoxyphene Nausea And Vomiting    Family History: Family History  Problem Relation Age of Onset  . Hypertension Mother   . Cancer Maternal Grandfather   . Cancer Brother   . Prostate cancer Brother   . Kidney disease Neg  Hx     Social History:  reports that he has never smoked. He has never used smokeless tobacco. He reports that he does not drink alcohol or use illicit drugs.  ROS: UROLOGY Frequent Urination?: No Hard to postpone urination?: No Burning/pain with urination?: No Get up at night to urinate?: No Leakage of urine?: No Urine stream starts and stops?: No Trouble starting stream?: No Do you have to strain to urinate?: No Blood in urine?: No Urinary tract infection?: No Sexually transmitted disease?: No Injury to kidneys or bladder?: No Painful intercourse?: No Weak stream?: No Erection problems?: No Penile pain?: No  Gastrointestinal Nausea?: No Vomiting?: No Indigestion/heartburn?: No Diarrhea?: No Constipation?: No  Constitutional Fever: No Night sweats?: No Weight loss?: No Fatigue?: No  Skin Skin rash/lesions?: No Itching?: No  Eyes Blurred vision?: No Double vision?: No  Ears/Nose/Throat Sore throat?: No Sinus problems?: No  Hematologic/Lymphatic Swollen glands?: No Easy bruising?: No  Cardiovascular Leg swelling?: No Chest pain?: No  Respiratory Cough?: No Shortness of breath?: No  Endocrine Excessive thirst?: No  Musculoskeletal Back pain?: No Joint pain?: No  Neurological Headaches?: No Dizziness?: No  Psychologic Depression?: No Anxiety?: No  Physical Exam: BP 162/66 mmHg  Pulse 61  Ht 5\' 9"  (1.753 m)  Wt 205 lb 4.8 oz (93.123 kg)  BMI 30.30 kg/m2  Constitutional:  Alert and oriented, No acute distress. HEENT: [place] AT, moist mucus membranes.  Trachea midline, no masses. Cardiovascular: No clubbing, cyanosis, or edema. Respiratory: Normal respiratory effort, no increased work of breathing. Skin: No rashes, bruises or suspicious lesions. Lymph: No cervical adenopathy. Neurologic: Grossly intact, no focal deficits, moving all 4 extremities. Psychiatric: Normal mood and affect.  Laboratory Data:   Urinalysis    Component  Value Date/Time   GLUCOSEU Negative 09/17/2015 0948   BILIRUBINUR Negative 09/17/2015 0948   NITRITE Negative 09/17/2015 0948   LEUKOCYTESUR Negative 09/17/2015 0948    Pertinent Imaging:   Assessment & Plan:    1. Renal stones- 24-hour UA reviewed in detail. She has good urine output at 2.3 L. His urine was found to be acidic. Patient states that he spoke with his oncologist and was told that he could resume his potassium citrate. Her serum blood levels his potassium was elevated at 5.5 at the time of collection. Potassium levels have been elevated in the past and it is unclear if this is possibly due to his potassium citrate intake. I suggested that patient decrease his dosage from 2 tablets per day to only 1 at night  and we will recheck his potassium levels in 1 month. We will repeat feet his 24 hour urinalysis in 3 months.  I instructed to monitor for symptoms such as muscle cramps and/or heart palpitations and to seek immediate medical attention and discontinue potassium citrate at that time. Low appearing diet reviewed and patient provided written information.  - Urinalysis, Complete   Return for Lab draw in 1 month, 4 months review 24hr UA.  These notes generated with voice recognition software. I apologize for typographical errors.  Alex Lang, [facility] [address], [place], [place] [address] [phone]

## 2015-11-09 NOTE — Patient Instructions (Signed)

## 2015-11-16 ENCOUNTER — Encounter: Payer: Self-pay | Admitting: Obstetrics and Gynecology

## 2015-11-17 ENCOUNTER — Telehealth: Payer: Self-pay | Admitting: Obstetrics and Gynecology

## 2015-11-17 NOTE — Telephone Encounter (Signed)
I spoke with patient's wife and moved his appointment to the 8th of December.  Thanks,] Sharyn Lull

## 2015-11-17 NOTE — Telephone Encounter (Signed)
Please notify patient that since his potassium was significantly elevated when checked for his litholink analysis I would like him to come back 1 week from now instead of 3 as previously planned to have a BMP drawn.  I just want to make sure that his levels are trending down since we decreased his dose of potassium citrate.  Thanks

## 2015-11-26 ENCOUNTER — Other Ambulatory Visit: Payer: Medicare Other

## 2015-11-26 DIAGNOSIS — N200 Calculus of kidney: Secondary | ICD-10-CM

## 2015-11-27 LAB — BASIC METABOLIC PANEL
BUN/Creatinine Ratio: 19 (ref 10–22)
BUN: 24 mg/dL (ref 8–27)
CHLORIDE: 102 mmol/L (ref 97–106)
CO2: 25 mmol/L (ref 18–29)
Calcium: 9.6 mg/dL (ref 8.6–10.2)
Creatinine, Ser: 1.25 mg/dL (ref 0.76–1.27)
GFR calc non Af Amer: 55 mL/min/{1.73_m2} — ABNORMAL LOW (ref >59)
GFR, EST AFRICAN AMERICAN: 64 mL/min/{1.73_m2} (ref >59)
Glucose: 107 mg/dL — ABNORMAL HIGH (ref 65–99)
Potassium: 5.3 mmol/L — ABNORMAL HIGH (ref 3.5–5.2)
SODIUM: 140 mmol/L (ref 136–144)

## 2015-11-30 ENCOUNTER — Telehealth: Payer: Self-pay

## 2015-11-30 NOTE — Telephone Encounter (Signed)
-----   Message from Roda Shutters, [place] sent at 11/27/2015  1:09 PM EST ----- Please notify patient that his potassium level has decreased though it still remains slightly elevated. I would like him to continue on his current dose of potassium citrate and follow up as scheduled.  thanks

## 2015-11-30 NOTE — Telephone Encounter (Signed)
Spoke with pt wife in reference to potassium. Wife voiced understanding.

## 2015-12-10 ENCOUNTER — Other Ambulatory Visit: Payer: Medicare Other

## 2015-12-25 ENCOUNTER — Other Ambulatory Visit: Payer: Self-pay

## 2015-12-25 DIAGNOSIS — N200 Calculus of kidney: Secondary | ICD-10-CM

## 2015-12-25 MED ORDER — POTASSIUM CITRATE ER 10 MEQ (1080 MG) PO TBCR: EXTENDED_RELEASE_TABLET | ORAL | Status: DC

## 2015-12-29 ENCOUNTER — Inpatient Hospital Stay: Payer: BLUE CROSS/BLUE SHIELD | Admitting: Oncology

## 2015-12-29 ENCOUNTER — Inpatient Hospital Stay: Payer: BLUE CROSS/BLUE SHIELD

## 2015-12-31 ENCOUNTER — Encounter: Payer: Self-pay | Admitting: Urology

## 2016-01-06 ENCOUNTER — Inpatient Hospital Stay: Payer: Medicare Other | Attending: Oncology | Admitting: Oncology

## 2016-01-06 ENCOUNTER — Other Ambulatory Visit: Payer: Self-pay

## 2016-01-06 ENCOUNTER — Encounter: Payer: Self-pay | Admitting: Oncology

## 2016-01-06 ENCOUNTER — Inpatient Hospital Stay: Payer: Medicare Other

## 2016-01-06 VITALS — BP 145/78 | HR 57 | Temp 97.7°F | Resp 18 | Wt 205.5 lb

## 2016-01-06 DIAGNOSIS — N529 Male erectile dysfunction, unspecified: Secondary | ICD-10-CM | POA: Diagnosis not present

## 2016-01-06 DIAGNOSIS — Z808 Family history of malignant neoplasm of other organs or systems: Secondary | ICD-10-CM

## 2016-01-06 DIAGNOSIS — C61 Malignant neoplasm of prostate: Secondary | ICD-10-CM | POA: Diagnosis present

## 2016-01-06 DIAGNOSIS — Z8042 Family history of malignant neoplasm of prostate: Secondary | ICD-10-CM

## 2016-01-06 DIAGNOSIS — Z79899 Other long term (current) drug therapy: Secondary | ICD-10-CM

## 2016-01-06 DIAGNOSIS — M1990 Unspecified osteoarthritis, unspecified site: Secondary | ICD-10-CM

## 2016-01-06 DIAGNOSIS — R5383 Other fatigue: Secondary | ICD-10-CM | POA: Diagnosis not present

## 2016-01-06 DIAGNOSIS — Z923 Personal history of irradiation: Secondary | ICD-10-CM

## 2016-01-06 DIAGNOSIS — I10 Essential (primary) hypertension: Secondary | ICD-10-CM

## 2016-01-06 DIAGNOSIS — R232 Flushing: Secondary | ICD-10-CM

## 2016-01-06 DIAGNOSIS — R531 Weakness: Secondary | ICD-10-CM

## 2016-01-06 DIAGNOSIS — N200 Calculus of kidney: Secondary | ICD-10-CM

## 2016-01-06 DIAGNOSIS — R300 Dysuria: Secondary | ICD-10-CM

## 2016-01-06 DIAGNOSIS — R3129 Other microscopic hematuria: Secondary | ICD-10-CM | POA: Diagnosis not present

## 2016-01-06 DIAGNOSIS — S43401S Unspecified sprain of right shoulder joint, sequela: Secondary | ICD-10-CM | POA: Diagnosis not present

## 2016-01-06 DIAGNOSIS — K219 Gastro-esophageal reflux disease without esophagitis: Secondary | ICD-10-CM

## 2016-01-06 DIAGNOSIS — R972 Elevated prostate specific antigen [PSA]: Secondary | ICD-10-CM | POA: Diagnosis not present

## 2016-01-06 DIAGNOSIS — N209 Urinary calculus, unspecified: Secondary | ICD-10-CM | POA: Diagnosis not present

## 2016-01-06 DIAGNOSIS — N400 Enlarged prostate without lower urinary tract symptoms: Secondary | ICD-10-CM

## 2016-01-06 LAB — COMPREHENSIVE METABOLIC PANEL
ALK PHOS: 88 U/L (ref 38–126)
ALT: 59 U/L (ref 17–63)
ANION GAP: 3 — AB (ref 5–15)
AST: 20 U/L (ref 15–41)
Albumin: 4 g/dL (ref 3.5–5.0)
BUN: 29 mg/dL — ABNORMAL HIGH (ref 6–20)
CHLORIDE: 103 mmol/L (ref 101–111)
CO2: 28 mmol/L (ref 22–32)
Calcium: 9.4 mg/dL (ref 8.9–10.3)
Creatinine, Ser: 1.16 mg/dL (ref 0.61–1.24)
GFR calc non Af Amer: 59 mL/min — ABNORMAL LOW (ref >60)
Glucose, Bld: 125 mg/dL — ABNORMAL HIGH (ref 65–99)
Potassium: 4.8 mmol/L (ref 3.5–5.1)
Sodium: 134 mmol/L — ABNORMAL LOW (ref 135–145)
TOTAL PROTEIN: 7 g/dL (ref 6.5–8.1)
Total Bilirubin: 0.5 mg/dL (ref 0.3–1.2)

## 2016-01-06 LAB — CBC WITH DIFFERENTIAL/PLATELET
Basophils Absolute: 0 10*3/uL (ref 0–0.1)
Basophils Relative: 1 %
EOS ABS: 0.1 10*3/uL (ref 0–0.7)
EOS PCT: 3 %
HCT: 36.3 % — ABNORMAL LOW (ref 40.0–52.0)
Hemoglobin: 12.7 g/dL — ABNORMAL LOW (ref 13.0–18.0)
LYMPHS ABS: 1.1 10*3/uL (ref 1.0–3.6)
Lymphocytes Relative: 25 %
MCH: 29.6 pg (ref 26.0–34.0)
MCHC: 35 g/dL (ref 32.0–36.0)
MCV: 84.6 fL (ref 80.0–100.0)
MONOS PCT: 11 %
Monocytes Absolute: 0.5 10*3/uL (ref 0.2–1.0)
Neutro Abs: 2.5 10*3/uL (ref 1.4–6.5)
Neutrophils Relative %: 60 %
PLATELETS: 193 10*3/uL (ref 150–440)
RBC: 4.29 MIL/uL — ABNORMAL LOW (ref 4.40–5.90)
RDW: 12.9 % (ref 11.5–14.5)
WBC: 4.2 10*3/uL (ref 3.8–10.6)

## 2016-01-06 LAB — PSA: PSA: 0.02 ng/mL (ref 0.00–4.00)

## 2016-01-06 MED ORDER — POTASSIUM CITRATE ER 10 MEQ (1080 MG) PO TBCR: EXTENDED_RELEASE_TABLET | ORAL | Status: DC

## 2016-01-06 NOTE — Progress Notes (Signed)
[place] @ [facility] Telephone:([phone]  Fax:([phone]) [place]: [birth year]  MR#: [id number]  RWE#:[id number]  Patient Care Team: Madelyn Brunner, MD as PCP - General (Internal Medicine)  CHIEF COMPLAINT:  Chief Complaint  Patient presents with  . Prostate Cancer    Oncology History   Chief Complaint/Diagnosis:   1.carcinoma prostateClinically staged   as T2A  NO MO . Gleason score 4+3.  8 out of 13    core  positive.  PSA is 4.8 According to NCCN  guidelines intermediate risk get diagnosis in December of 2015. 2.  External beam radiation radioactive seeds has been planned (January, 2015 3, androgen deprivation therapy starting from December of 2015 with Lupron  for duration 04-6 mont 4. patient finished radiation therapy on feb  8 th, 2016 5.  Patient received last dose of Lupron therapy in march of 2016 with PSA dropping to 0.2     Prostate cancer ([place])   06/21/2015 Initial Diagnosis Prostate cancer   80 year old gentleman with recurrent carcinoma prostate INTERVAL HISTORY: [age] year old man came today further follow-up since last evaluation patient had right shoulder pain MRI scan was done and was found to have osteoarthritis and ligament tear.  Was seen by orthopedic surgeon.  Gradually pain is getting better.  Recently patient also had a renal stones so was advised to stop calcium.  Here for further evaluation and treatment consideration and received total 6 month duration of anti-hormonal therapy.  No bony pains. Patient complains of hot flashes.  No bony pains. No dysuria hematuria had a recent upper respiratory tract infection  REVIEW OF SYSTEMS:    general status: Patient is feeling weak and tired.  No change in a performance status.  No chills.  No fever. HEENT  No evidence of stomatitis Lungs: No cough or shortness of breath Cardiac: No chest pain or paroxysmal nocturnal dyspnea GI: No nausea no vomiting no diarrhea no abdominal  pain Skin: No rash Lower extremity no swelling Neurological system: No tingling.  No numbness.  No other focal signs Musculoskeletal system no bony pains.  Right shoulder pain now gradually getting better had MRI scan done GU: No hematuria.  No dysuria. Hot flashes are getting better  As per HPI. Otherwise, a complete review of systems is negatve.  PAST MEDICAL HISTORY: Past Medical History  Diagnosis Date  . Kidney stone     bilateral  . Prostate cancer ([place])   . Hypertension   . Acid reflux   . Nephrolithiasis 2016  . GERD (gastroesophageal reflux disease)   . Erectile dysfunction   . Elevated PSA   . Microscopic hematuria   . Hypogonadism in male   . BPH (benign prostatic hyperplasia)   . Gross hematuria   . BP (high blood pressure) 10/02/2014  . Kidney stones 06/21/2015  . Renal stones 08/11/2015    PAST SURGICAL HISTORY: Past Surgical History  Procedure Laterality Date  . Prostate surgery      seeds  . Kidney stone surgery    . Extracorporeal shockwave lithotripsy  2016  . Laser lithotripsy and stent placement  2005  . Orthopedic surgery    . Extracorporeal shock wave lithotripsy Right 07/23/2015    Procedure: EXTRACORPOREAL SHOCK WAVE LITHOTRIPSY (ESWL);  Surgeon: Hollice Espy, MD;  Location: [facility] ORS;  Service: Urology;  Laterality: Right;  . Extracorporeal shock wave lithotripsy Left 09/03/2015    Procedure: EXTRACORPOREAL SHOCK WAVE LITHOTRIPSY (ESWL);  Surgeon: Aaron Edelman  Harolyn Rutherford, MD;  Location: [facility] ORS;  Service: Urology;  Laterality: Left;    FAMILY HISTORY Family History  Problem Relation Age of Onset  . Hypertension Mother   . Cancer Maternal Grandfather   . Cancer Brother   . Prostate cancer Brother   . Kidney disease Neg Hx     ADVANCED DIRECTIVES:  Patient does have a living will  HEALTH MAINTENANCE: Social History  Substance Use Topics  . Smoking status: Never Smoker   . Smokeless tobacco: Never Used  . Alcohol Use: No      Allergies   Allergen Reactions  . Other Nausea And Vomiting    Darvocet  . Oxycodone Nausea And Vomiting  . Propoxyphene Nausea And Vomiting    Current Outpatient Prescriptions  Medication Sig Dispense Refill  . benazepril (LOTENSIN) 5 MG tablet Take 5 mg by mouth 2 (two) times daily.     . calcium carbonate (CALCIUM 600) 600 MG TABS tablet Take by mouth.    . Cholecalciferol (VITAMIN D3) 2000 UNITS capsule Take 2,000 Units by mouth daily.     Marland Kitchen Fish Oil-Cholecalciferol (FISH OIL + D3 PO) Take 1,200 mg by mouth daily.     . Ginkgo Biloba 40 MG TABS Take 120 mg by mouth daily.     Marland Kitchen ibuprofen (ADVIL,MOTRIN) 200 MG tablet Take 200 mg by mouth daily.    Marland Kitchen Lysine 500 MG CAPS Take 500 mg by mouth 2 (two) times daily.     Marland Kitchen omeprazole (PRILOSEC) 20 MG capsule Take 20 mg by mouth 2 (two) times daily before a meal.     . potassium citrate (UROCIT-K) 10 MEQ (1080 MG) SR tablet 1 tablet po four times daily 270 tablet 3  . tamsulosin (FLOMAX) 0.4 MG CAPS capsule Take 0.4 mg by mouth daily.      No current facility-administered medications for this visit.    OBJECTIVE:  Filed Vitals:   01/06/16 0951  BP: 145/78  Pulse: 57  Temp: 97.7 F (36.5 C)  Resp: 18     Body mass index is 30.33 kg/(m^2).    ECOG FS:1 - Symptomatic but completely ambulatory  PHYSICAL EXAM: GENERAL:  Well developed, well nourished, sitting comfortably in the exam room in no acute distress. MENTAL STATUS:  Alert and oriented to person, place and time. HEAD:  Normocephalic, atraumatic, face symmetric, no Cushingoid features. EYES:  Pupils equal round and reactive to light and accomodation.  No conjunctivitis or scleral icterus. ENT:  Oropharynx clear without lesion.  Tongue normal. Mucous membranes moist.  RESPIRATORY:  Clear to auscultation without rales, wheezes or rhonchi. CARDIOVASCULAR:  Regular rate and rhythm without murmur, rub or gallop. BREAST:  Right breast without masses, skin changes or nipple discharge.  Left  breast without masses, skin changes or nipple discharge. ABDOMEN:  Soft, non-tender, with active bowel sounds, and no hepatosplenomegaly.  No masses. BACK:  No CVA tenderness.  No tenderness on percussion of the back or rib cage. SKIN:  No rashes, ulcers or lesions. EXTREMITIES: No edema, no skin discoloration or tenderness.  No palpable cords. LYMPH NODES: No palpable cervical, supraclavicular, axillary or inguinal adenopathy  NEUROLOGICAL: Unremarkable. PSYCH:  Appropriate.   LAB RESULTS:  Appointment on 01/06/2016  Component Date Value Ref Range Status  . WBC 01/06/2016 4.2  3.8 - 10.6 K/uL Final  . RBC 01/06/2016 4.29* 4.40 - 5.90 MIL/uL Final  . Hemoglobin 01/06/2016 12.7* 13.0 - 18.0 g/dL Final  . HCT 01/06/2016 36.3* 40.0 - 52.0 %  Final  . MCV 01/06/2016 84.6  80.0 - 100.0 fL Final  . MCH 01/06/2016 29.6  26.0 - 34.0 pg Final  . MCHC 01/06/2016 35.0  32.0 - 36.0 g/dL Final  . RDW 01/06/2016 12.9  11.5 - 14.5 % Final  . Platelets 01/06/2016 193  150 - 440 K/uL Final  . Neutrophils Relative % 01/06/2016 60   Final  . Neutro Abs 01/06/2016 2.5  1.4 - 6.5 K/uL Final  . Lymphocytes Relative 01/06/2016 25   Final  . Lymphs Abs 01/06/2016 1.1  1.0 - 3.6 K/uL Final  . Monocytes Relative 01/06/2016 11   Final  . Monocytes Absolute 01/06/2016 0.5  0.2 - 1.0 K/uL Final  . Eosinophils Relative 01/06/2016 3   Final  . Eosinophils Absolute 01/06/2016 0.1  0 - 0.7 K/uL Final  . Basophils Relative 01/06/2016 1   Final  . Basophils Absolute 01/06/2016 0.0  0 - 0.1 K/uL Final  . Sodium 01/06/2016 134* 135 - 145 mmol/L Final  . Potassium 01/06/2016 4.8  3.5 - 5.1 mmol/L Final  . Chloride 01/06/2016 103  101 - 111 mmol/L Final  . CO2 01/06/2016 28  22 - 32 mmol/L Final  . Glucose, Bld 01/06/2016 125* 65 - 99 mg/dL Final  . BUN 01/06/2016 29* 6 - 20 mg/dL Final  . Creatinine, Ser 01/06/2016 1.16  0.61 - 1.24 mg/dL Final  . Calcium 01/06/2016 9.4  8.9 - 10.3 mg/dL Final  . Total Protein  01/06/2016 7.0  6.5 - 8.1 g/dL Final  . Albumin 01/06/2016 4.0  3.5 - 5.0 g/dL Final  . AST 01/06/2016 20  15 - 41 U/L Final  . ALT 01/06/2016 59  17 - 63 U/L Final  . Alkaline Phosphatase 01/06/2016 88  38 - 126 U/L Final  . Total Bilirubin 01/06/2016 0.5  0.3 - 1.2 mg/dL Final  . GFR calc non Af Amer 01/06/2016 59* >60 mL/min Final  . GFR calc Af Amer 01/06/2016 >60  >60 mL/min Final   Comment: (NOTE) The eGFR has been calculated using the CKD EPI equation. This calculation has not been validated in all clinical situations. eGFR's persistently <60 mL/min signify possible Chronic Kidney Disease.   . Anion gap 01/06/2016 3* 5 - 15 Final      STUDIES: No results found.  ASSESSMENT: Recurrent carcinoma prostate.  Locally advanced disease status post radiation and 6 months of Lupron therapy Right shoulder pain with negative MRI for any metastases History of kidney stones  MEDICAL DECISION MAKING:  All lab data has been reviewed.  Last PSA in March of 2016 was 0.2 Patient had dysuria total 6 months of  androgen deprivation therapy.  Aspirin cc at guideline that was duration was recommended for 4-6 month so no further injections of Lupron would be given  Last PSA was 0.04 PSA would be rechecked Patient will be followed in 6 month  Patient expressed understanding and was in agreement with this plan. He also understands that He can call clinic at any time with any questions, concerns, or complaints.    No matching staging information was found for the patient.  Forest Gleason, MD   01/06/2016 10:20 AM

## 2016-01-18 ENCOUNTER — Encounter: Payer: Self-pay | Admitting: Radiation Oncology

## 2016-01-18 ENCOUNTER — Other Ambulatory Visit: Payer: BLUE CROSS/BLUE SHIELD

## 2016-01-18 ENCOUNTER — Ambulatory Visit
Admission: RE | Admit: 2016-01-18 | Discharge: 2016-01-18 | Disposition: A | Discharge status: Home / Self Care | Payer: Medicare Other | Source: Ambulatory Visit | Attending: Radiation Oncology | Admitting: Radiation Oncology

## 2016-01-18 VITALS — BP 158/68 | HR 57 | Resp 22 | Wt 209.4 lb

## 2016-01-18 DIAGNOSIS — C61 Malignant neoplasm of prostate: Secondary | ICD-10-CM

## 2016-01-18 NOTE — Progress Notes (Signed)
Radiation Oncology Follow up Note  Name: Alex Lang   Date:   01/18/2016 MRN:  [id number] DOB: September 07, 1936    This 80 y.o. male presents to the clinic today for follow-up for prostate cancer now out 1 year status post I-125 interstitial implant.  REFERRING PROVIDER: No ref. provider found  HPI: Patient is a 80 year old male now out 1 year having completed I-125 interstitial implant for a stage IIa (T2 N0 M0) Gleason 6 (3+3) adenocarcinoma prostate presenting the PSA 4.8. Seen today in routine follow-up he is doing well. He specifically denies diarrhea dysuria or any other GI/GU complaints. His last PSA 6 months prior was 0.02.Marland Kitchen  COMPLICATIONS OF TREATMENT: none  FOLLOW UP COMPLIANCE: keeps appointments   PHYSICAL EXAM:  BP 158/68 mmHg  Pulse 57  Resp 22  Wt 209 lb 7 oz (95 kg) On rectal exam rectal sphincter tone is good. Prostate is smooth contracted without evidence of nodularity or mass. Sulcus is preserved bilaterally. No discrete nodularity is identified. No other rectal abnormalities are noted. Well-developed well-nourished patient in NAD. HEENT reveals PERLA, EOMI, discs not visualized.  Oral cavity is clear. No oral mucosal lesions are identified. Neck is clear without evidence of cervical or supraclavicular adenopathy. Lungs are clear to A&P. Cardiac examination is essentially unremarkable with regular rate and rhythm without murmur rub or thrill. Abdomen is benign with no organomegaly or masses noted. Motor sensory and DTR levels are equal and symmetric in the upper and lower extremities. Cranial nerves II through XII are grossly intact. Proprioception is intact. No peripheral adenopathy or edema is identified. No motor or sensory levels are noted. Crude visual fields are within normal range.  RADIOLOGY RESULTS: No current films for review  PLAN: At the present time he continues to do well with no biochemical evidence of recurrence. I've run a PSA level on him today and  will report that separately. Otherwise I've asked to see him back in 1 year for follow-up. Patient knows to call sooner with any concerns.  I would like to take this opportunity for allowing me to participate in the care of your patient.Armstead Peaks., MD

## 2016-02-17 ENCOUNTER — Other Ambulatory Visit: Payer: Medicare Other

## 2016-02-23 ENCOUNTER — Other Ambulatory Visit: Payer: Self-pay | Admitting: Obstetrics and Gynecology

## 2016-03-08 ENCOUNTER — Encounter: Payer: Self-pay | Admitting: Urology

## 2016-03-08 ENCOUNTER — Ambulatory Visit (INDEPENDENT_AMBULATORY_CARE_PROVIDER_SITE_OTHER): Payer: Medicare Other | Admitting: Urology

## 2016-03-08 VITALS — BP 167/72 | HR 60 | Ht 68.0 in | Wt 207.7 lb

## 2016-03-08 DIAGNOSIS — C61 Malignant neoplasm of prostate: Secondary | ICD-10-CM | POA: Diagnosis not present

## 2016-03-08 DIAGNOSIS — N200 Calculus of kidney: Secondary | ICD-10-CM

## 2016-03-08 LAB — URINALYSIS, COMPLETE
Bilirubin, UA: NEGATIVE
GLUCOSE, UA: NEGATIVE
KETONES UA: NEGATIVE
LEUKOCYTES UA: NEGATIVE
Nitrite, UA: NEGATIVE
Protein, UA: NEGATIVE
SPEC GRAV UA: 1.01 (ref 1.005–1.030)
Urobilinogen, Ur: 0.2 mg/dL (ref 0.2–1.0)
pH, UA: 6 (ref 5.0–7.5)

## 2016-03-08 LAB — MICROSCOPIC EXAMINATION
Bacteria, UA: NONE SEEN
EPITHELIAL CELLS (NON RENAL): NONE SEEN /HPF (ref 0–10)

## 2016-03-08 NOTE — Progress Notes (Signed)
03/08/2016 9:04 AM   Alex Lang November 25, 1936 [id number]  Referring provider: Madelyn Brunner, MD [facility] [place], [place] [address]  Chief Complaint  Patient presents with  . Results    Litholink    HPI: Patient is a 80 year old Caucasian male with a history of prostate cancer and nephrolithiasis who presents today to discuss his repeated 24-hour Litholink results.  Prostate cancer Patient is now out 1 year having completed I-125 interstitial implant for a stage IIa (T2 N0 M0) Gleason 6 (3+3) adenocarcinoma prostate presenting the PSA 4.8.   His last PSA in 10/2015  was 0.02.    Litholink results Patient's repeated litho-link results note a fall and urinary volume from 2.31 L daily to 1.69 L daily.  Patient is emphatic that he consumes large amounts of water.  He states when he urinates in the toilet the urine is a very pale yellow and clear.  His urine pH has increased slightly but remained modestly low from 5.422-5.553 at this time.  He admits that he has only been taking one Urocit-K daily.  He does not consume a large amount of protein or nuts.  His stone composition was uric acid stones.     PMH: Past Medical History  Diagnosis Date  . Kidney stone     bilateral  . Prostate cancer ([place])   . Hypertension   . Acid reflux   . Nephrolithiasis 2016  . GERD (gastroesophageal reflux disease)   . Erectile dysfunction   . Elevated PSA   . Microscopic hematuria   . Hypogonadism in male   . BPH (benign prostatic hyperplasia)   . Gross hematuria   . BP (high blood pressure) 10/02/2014  . Kidney stones 06/21/2015  . Renal stones 08/11/2015    Surgical History: Past Surgical History  Procedure Laterality Date  . Prostate surgery      seeds  . Kidney stone surgery    . Extracorporeal shockwave lithotripsy  2016  . Laser lithotripsy and stent placement  2005  . Orthopedic surgery    . Extracorporeal shock wave lithotripsy Right  07/23/2015    Procedure: EXTRACORPOREAL SHOCK WAVE LITHOTRIPSY (ESWL);  Surgeon: Alex Espy, MD;  Location: [facility] ORS;  Service: Urology;  Laterality: Right;  . Extracorporeal shock wave lithotripsy Left 09/03/2015    Procedure: EXTRACORPOREAL SHOCK WAVE LITHOTRIPSY (ESWL);  Surgeon: Alex Retort, MD;  Location: [facility] ORS;  Service: Urology;  Laterality: Left;    Home Medications:    Medication List       This list is accurate as of: 03/08/16  9:04 AM.  Always use your most recent med list.               benazepril 5 MG tablet  Commonly known as:  LOTENSIN  Take 5 mg by mouth 2 (two) times daily.     CALCIUM 600 600 MG Tabs tablet  Generic drug:  calcium carbonate  Take by mouth.     FISH OIL + D3 PO  Take 1,200 mg by mouth daily.     Ginkgo Biloba 40 MG Tabs  Take 120 mg by mouth daily.     ibuprofen 200 MG tablet  Commonly known as:  ADVIL,MOTRIN  Take 200 mg by mouth daily.     Lysine 500 MG Caps  Take 500 mg by mouth 2 (two) times daily.     omeprazole 20 MG capsule  Commonly known as:  PRILOSEC  Take  20 mg by mouth 2 (two) times daily before a meal.     potassium citrate 10 MEQ (1080 MG) SR tablet  Commonly known as:  UROCIT-K  1 tablet po four times daily     Vitamin D3 2000 units capsule  Take 2,000 Units by mouth daily.        Allergies:  Allergies  Allergen Reactions  . Other Nausea And Vomiting    Darvocet  . Oxycodone Nausea And Vomiting  . Propoxyphene Nausea And Vomiting    Family History: Family History  Problem Relation Age of Onset  . Hypertension Mother   . Cancer Maternal Grandfather   . Cancer Brother   . Prostate cancer Brother   . Kidney disease Neg Hx     Social History:  reports that he has never smoked. He has never used smokeless tobacco. He reports that he does not drink alcohol or use illicit drugs.  ROS: UROLOGY Frequent Urination?: No Hard to postpone urination?: No Burning/pain with urination?: No Get up  at night to urinate?: No Leakage of urine?: No Urine stream starts and stops?: No Trouble starting stream?: No Do you have to strain to urinate?: No Blood in urine?: No Urinary tract infection?: No Sexually transmitted disease?: No Injury to kidneys or bladder?: No Painful intercourse?: No Weak stream?: No Erection problems?: No Penile pain?: No  Gastrointestinal Nausea?: No Vomiting?: No Indigestion/heartburn?: No Diarrhea?: No Constipation?: No  Constitutional Fever: No Night sweats?: No Weight loss?: No Fatigue?: No  Skin Skin rash/lesions?: No Itching?: No  Eyes Blurred vision?: No Double vision?: No  Ears/Nose/Throat Sore throat?: No Sinus problems?: No  Hematologic/Lymphatic Swollen glands?: No Easy bruising?: No  Cardiovascular Leg swelling?: No Chest pain?: No  Respiratory Cough?: No Shortness of breath?: No  Endocrine Excessive thirst?: No  Musculoskeletal Back pain?: No Joint pain?: No  Neurological Headaches?: No Dizziness?: No  Psychologic Depression?: No Anxiety?: No  Physical Exam: BP 167/72 mmHg  Pulse 60  Ht 5\' 8"  (1.727 m)  Wt 207 lb 11.2 oz (94.212 kg)  BMI 31.59 kg/m2  Constitutional: Well nourished. Alert and oriented, No acute distress. HEENT: [place] AT, moist mucus membranes. Trachea midline, no masses. Cardiovascular: No clubbing, cyanosis, or edema. Respiratory: Normal respiratory effort, no increased work of breathing. GI: Abdomen is soft, non tender, non distended, no abdominal masses.  Skin: No rashes, bruises or suspicious lesions. Lymph: No cervical or inguinal adenopathy. Neurologic: Grossly intact, no focal deficits, moving all 4 extremities. Psychiatric: Normal mood and affect.  Laboratory Data: Lab Results  Component Value Date   WBC 4.2 01/06/2016   HGB 12.7* 01/06/2016   HCT 36.3* 01/06/2016   MCV 84.6 01/06/2016   PLT 193 01/06/2016   Lab Results  Component Value Date   CREATININE 1.16  01/06/2016   Lab Results  Component Value Date   PSA 0.02 01/06/2016   PSA 0.04 06/23/2015   PSA 0.2 03/16/2015   Lab Results  Component Value Date   AST 20 01/06/2016   Lab Results  Component Value Date   ALT 59 01/06/2016    Urinalysis Results for orders placed or performed in visit on 03/08/16  Microscopic Examination  Result Value Ref Range   WBC, UA 0-5 0 -  5 /hpf   RBC, UA 0-2 0 -  2 /hpf   Epithelial Cells (non renal) None seen 0 - 10 /hpf   Bacteria, UA None seen None seen/Few  Urinalysis, Complete  Result Value Ref Range  Specific Gravity, UA 1.010 1.005 - 1.030   pH, UA 6.0 5.0 - 7.5   Color, UA Yellow Yellow   Appearance Ur Clear Clear   Leukocytes, UA Negative Negative   Protein, UA Negative Negative/Trace   Glucose, UA Negative Negative   Ketones, UA Negative Negative   RBC, UA Trace (A) Negative   Bilirubin, UA Negative Negative   Urobilinogen, Ur 0.2 0.2 - 1.0 mg/dL   Nitrite, UA Negative Negative   Microscopic Examination See below:    Pertinent Imaging: CLINICAL DATA: History of urinary tract stones ; left-sided lithotripsy 3 weeks ago; lithotripsy of right-sided stones in July 2016; history of prostate malignancy.  EXAM: RENAL / URINARY TRACT ULTRASOUND COMPLETE  COMPARISON: Renal ultrasound of August 31, 2015  FINDINGS: Right Kidney:  Length: 11.9 cm. The renal cortical echotexture is normal. There is a lower pole simple appearing cyst measuring 2 x 1.4 x 1.9 cm.  Left Kidney:  Length: 11.6 cm. In the lower pole of the left kidney there is a 3.6 x 2.3 x 1.4 cm diameter cystic structure with a partial internal septation. This measured approximately 0 HU on the previous CT scan. The renal cortical echotexture is normal. There is no hydronephrosis.  Bladder:  Appears normal for degree of bladder distention.  IMPRESSION: 1. The right kidney exhibits a simple appearing lower pole cyst. No stones are evident. There is  no hydronephrosis. 2. The left kidney exhibits a partially septated lower pole cyst. No stones are evident. There is no hydronephrosis. 3. The urinary bladder is unremarkable.   Electronically Signed  By: David Martinique M.D.  On: 10/12/2015 13:17  CLINICAL DATA: KUB to check abdomen due to lithotripsy x2weeks ago on left kidney. Pt states he's been passing stones successfully since then. Pmh: lithotripsy on right kidney back in august. Pt was diagnosed with prostate Ca last summer.  EXAM: ABDOMEN - 1 VIEW  COMPARISON: 09/03/2015  FINDINGS: 5 mm stone or cluster projects in the region of the upper pole left renal collecting system, and a smaller cluster of stones up to 3 mm projects in the lower pole.  Normal bowel gas pattern.  Mild spondylitic changes at in the lower thoracic and lumbar spine. Metallic seeds in the region of the prostate.  IMPRESSION: 1. Small residual upper and lower pole left renal calculi.   Electronically Signed  By: Lucrezia Europe M.D.  On: 09/17/2015 09:58   Assessment & Plan:    1. Kidney stones:   Patient will a history of uric acid stones.  We will increase his Urocit-K dose to two tablets at night.  He will be returning in 3 months for repeated labs (BMP, CBC and UA).    - Urinalysis, Complete - CBC with Differential/Platelet - Basic metabolic panel  2. Prostate cancer:   Patient is now out 1 year having completed I-125 interstitial implant for a stage IIa (T2 N0 M0) Gleason 6 (3+3) adenocarcinoma prostate presenting the PSA 4.8.   His last PSA in 10/2015  was 0.02.   He will be returning in 3 months for a PSA, IPSS score, SHIM and exam.    I spent 25 minutes with the patient in a face to face conservation discussing his Litholink results.  Greater than 50% was spent in counseling & coordination of care with the patient.  Return in about 3 months (around 06/08/2016) for IPSS score, exam, SHIM, PSA, UA, BMP and CBC.  These  notes generated with voice recognition software. I apologize  for typographical errors.  Zara Council, [facility] [address] [place], [place] [address] [phone]

## 2016-03-09 ENCOUNTER — Telehealth: Payer: Self-pay

## 2016-03-09 LAB — BASIC METABOLIC PANEL
BUN / CREAT RATIO: 16 (ref 10–22)
BUN: 17 mg/dL (ref 8–27)
CO2: 22 mmol/L (ref 18–29)
CREATININE: 1.09 mg/dL (ref 0.76–1.27)
Calcium: 9.3 mg/dL (ref 8.6–10.2)
Chloride: 101 mmol/L (ref 96–106)
GFR, EST AFRICAN AMERICAN: 75 mL/min/{1.73_m2} (ref >59)
GFR, EST NON AFRICAN AMERICAN: 65 mL/min/{1.73_m2} (ref >59)
Glucose: 147 mg/dL — ABNORMAL HIGH (ref 65–99)
Potassium: 5 mmol/L (ref 3.5–5.2)
SODIUM: 138 mmol/L (ref 134–144)

## 2016-03-09 LAB — CBC WITH DIFFERENTIAL/PLATELET
Basophils Absolute: 0 10*3/uL (ref 0.0–0.2)
Basos: 1 %
EOS (ABSOLUTE): 0.2 10*3/uL (ref 0.0–0.4)
EOS: 4 %
HEMATOCRIT: 36.2 % — AB (ref 37.5–51.0)
HEMOGLOBIN: 12.3 g/dL — AB (ref 12.6–17.7)
Immature Grans (Abs): 0 10*3/uL (ref 0.0–0.1)
Immature Granulocytes: 0 %
LYMPHS ABS: 1.2 10*3/uL (ref 0.7–3.1)
Lymphs: 30 %
MCH: 29.5 pg (ref 26.6–33.0)
MCHC: 34 g/dL (ref 31.5–35.7)
MCV: 87 fL (ref 79–97)
MONOCYTES: 12 %
MONOS ABS: 0.5 10*3/uL (ref 0.1–0.9)
NEUTROS ABS: 2.1 10*3/uL (ref 1.4–7.0)
Neutrophils: 53 %
Platelets: 179 10*3/uL (ref 150–379)
RBC: 4.17 x10E6/uL (ref 4.14–5.80)
RDW: 13.9 % (ref 12.3–15.4)
WBC: 4 10*3/uL (ref 3.4–10.8)

## 2016-03-09 NOTE — Telephone Encounter (Signed)
LMOM

## 2016-03-09 NOTE — Telephone Encounter (Signed)
-----   Message from Nori Riis, PA-C sent at 03/09/2016  8:25 AM EDT ----- Labs are within normal limits.  We will repeat the labs when he returns in June.

## 2016-03-09 NOTE — Telephone Encounter (Signed)
Spoke with pt wife and made aware of labs and will be redrawn in June. Wife voiced understanding.

## 2016-03-29 ENCOUNTER — Other Ambulatory Visit: Payer: Self-pay | Admitting: Urology

## 2016-03-29 DIAGNOSIS — N200 Calculus of kidney: Secondary | ICD-10-CM

## 2016-06-08 ENCOUNTER — Encounter: Payer: Self-pay | Admitting: Urology

## 2016-06-08 ENCOUNTER — Ambulatory Visit (INDEPENDENT_AMBULATORY_CARE_PROVIDER_SITE_OTHER): Payer: Medicare Other | Admitting: Urology

## 2016-06-08 VITALS — BP 168/71 | HR 54 | Ht 68.0 in | Wt 204.1 lb

## 2016-06-08 DIAGNOSIS — N200 Calculus of kidney: Secondary | ICD-10-CM | POA: Diagnosis not present

## 2016-06-08 DIAGNOSIS — N529 Male erectile dysfunction, unspecified: Secondary | ICD-10-CM

## 2016-06-08 DIAGNOSIS — C61 Malignant neoplasm of prostate: Secondary | ICD-10-CM

## 2016-06-08 DIAGNOSIS — N528 Other male erectile dysfunction: Secondary | ICD-10-CM

## 2016-06-08 LAB — MICROSCOPIC EXAMINATION
BACTERIA UA: NONE SEEN
Epithelial Cells (non renal): NONE SEEN /hpf (ref 0–10)
RBC MICROSCOPIC, UA: NONE SEEN /HPF (ref 0–?)
WBC UA: NONE SEEN /HPF (ref 0–?)

## 2016-06-08 LAB — URINALYSIS, COMPLETE
Bilirubin, UA: NEGATIVE
GLUCOSE, UA: NEGATIVE
KETONES UA: NEGATIVE
Leukocytes, UA: NEGATIVE
NITRITE UA: NEGATIVE
PROTEIN UA: NEGATIVE
UUROB: 0.2 mg/dL (ref 0.2–1.0)
pH, UA: 5.5 (ref 5.0–7.5)

## 2016-06-08 IMAGING — CR DG ABDOMEN 1V
1 series · 2 of 2 positions shown · non-contrast
Comparison: 09/23/2015

CLINICAL DATA: Right back pain, history of renal calculi

EXAM:
ABDOMEN - 1 VIEW

[Series 1: dg abd 1 view · 0.14mm/px · 2 of 2 slices shown]
[im 1/2]
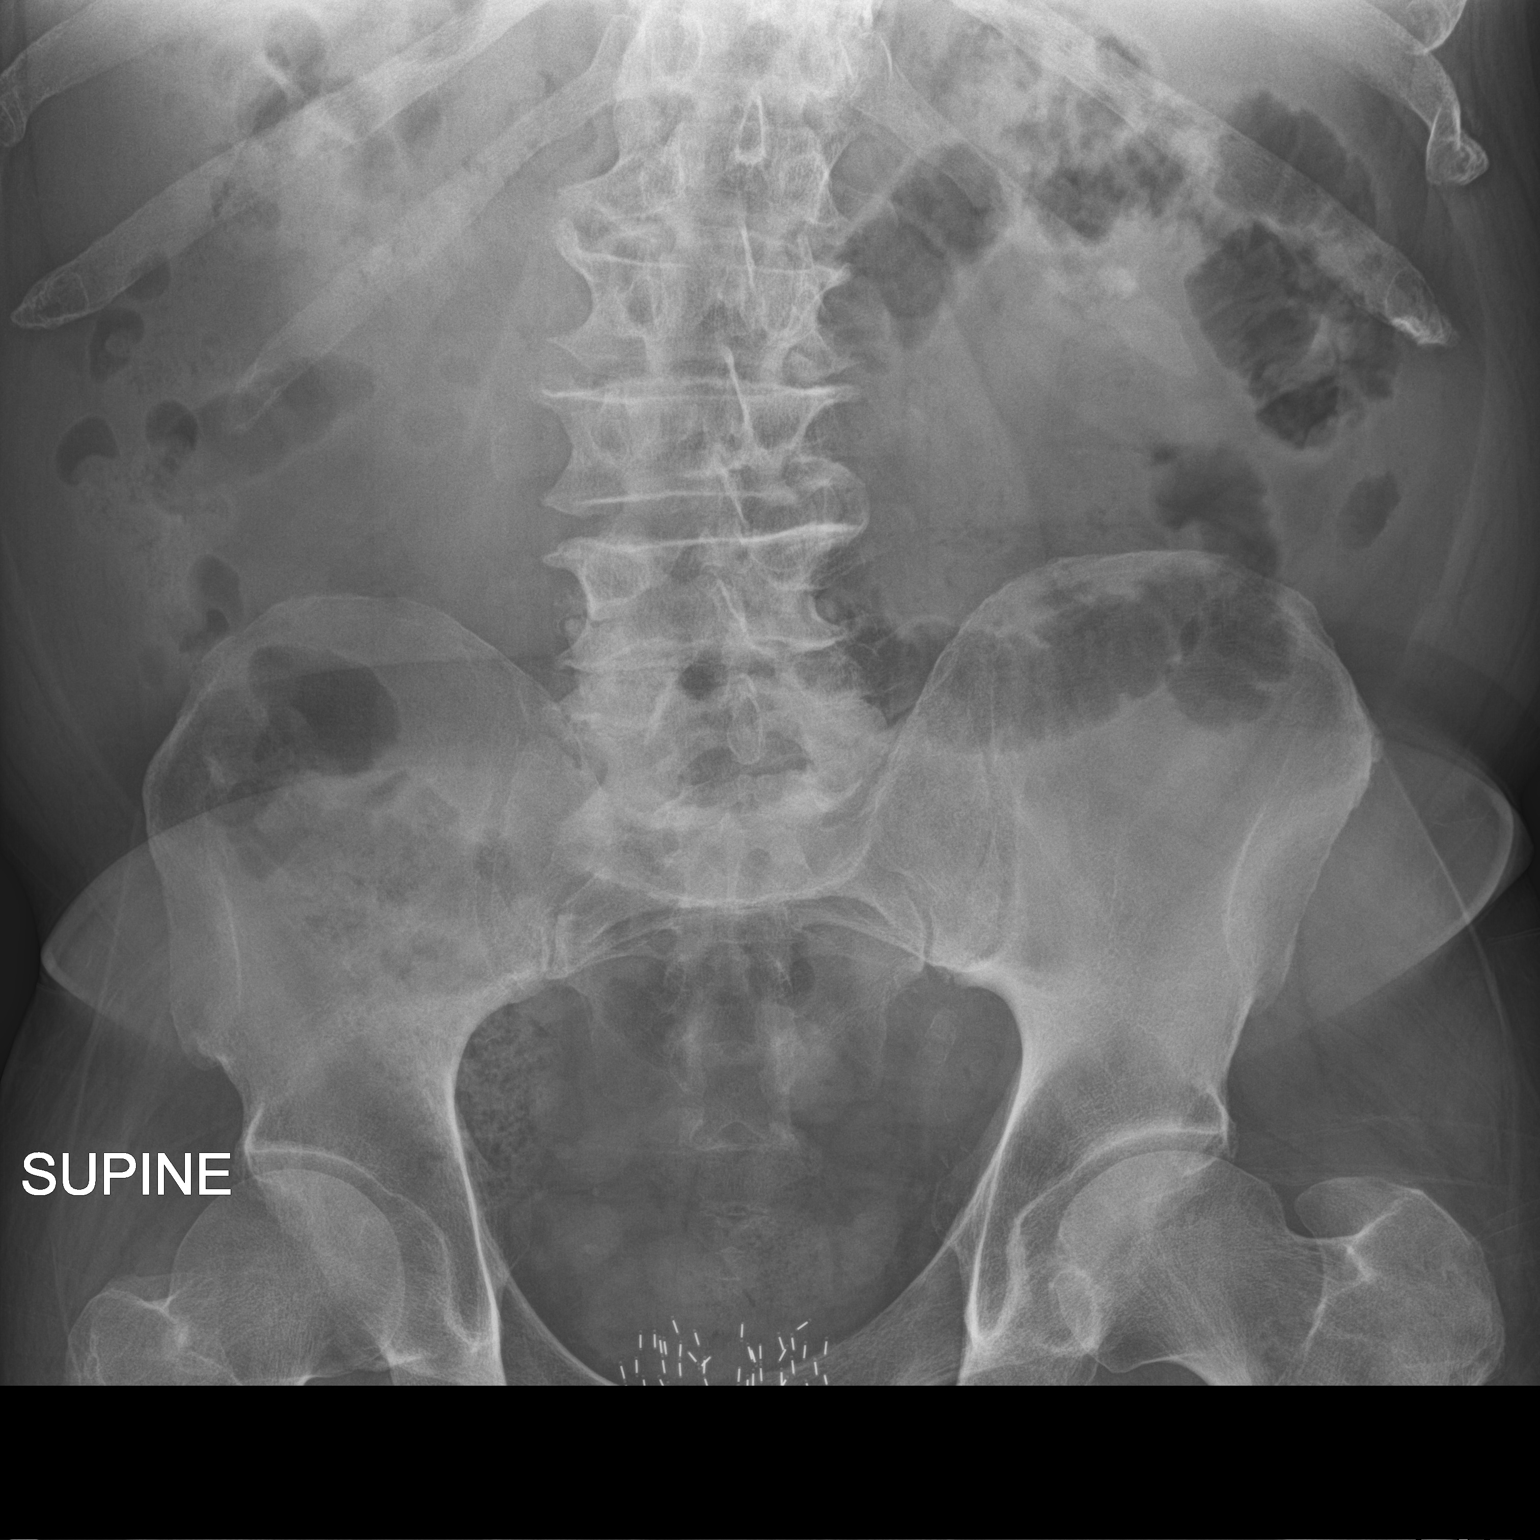
[im 2/2]
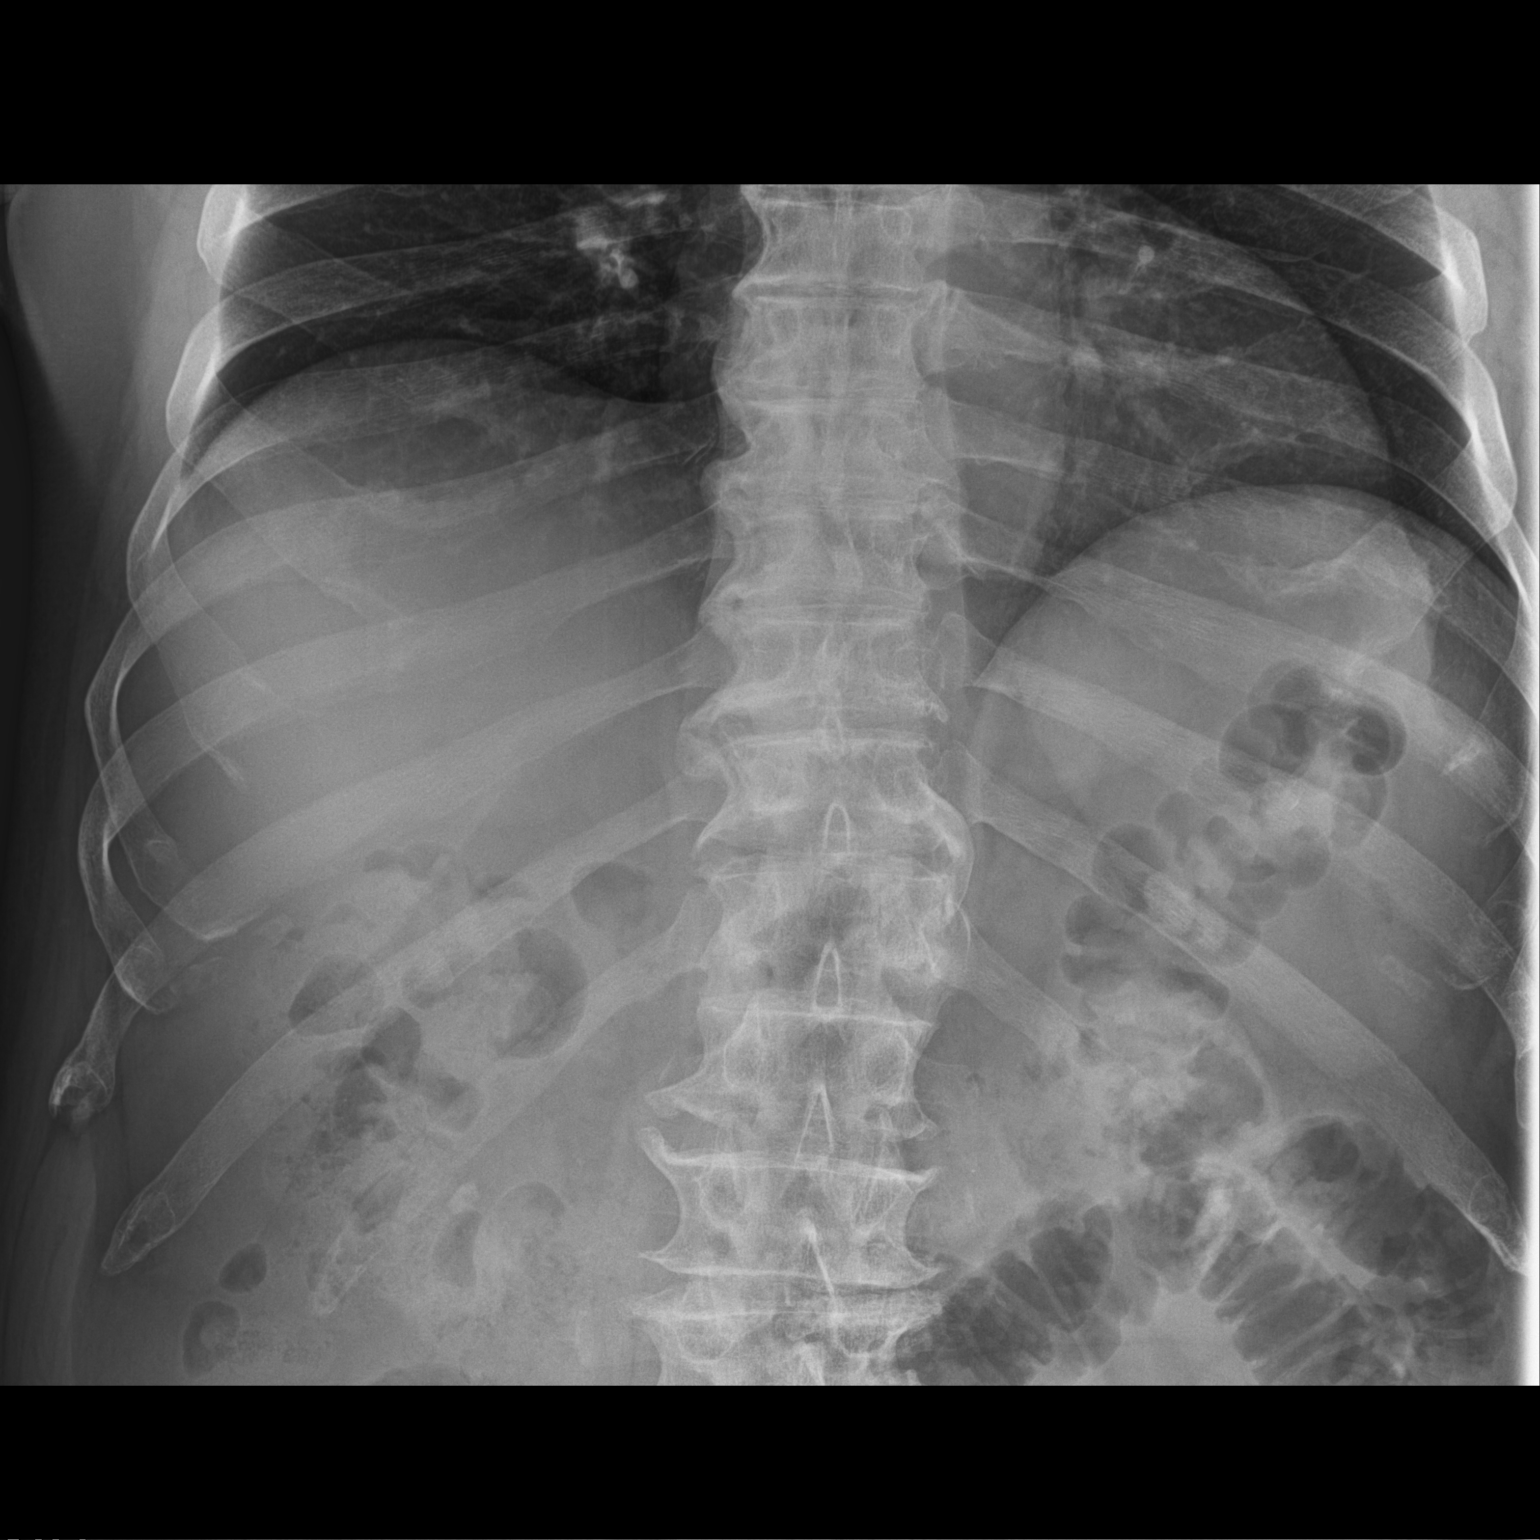

[2 of 2 positions shown; findings below may reference images not displayed]

FINDINGS: Bilateral renal calculi are reidentified. Normal bowel-gas pattern.
No free air. Prostatic brachytherapy seeds. Moderate lumbar spine
disc degenerative change and rightward curvature centered at L3
reidentified.
IMPRESSION: No significant change in bilateral renal calculi.

## 2016-06-08 NOTE — Progress Notes (Signed)
9:17 AM   Alex Lang Jan 05, 1936 [id number]  Referring provider: Madelyn Brunner, MD [place] [facility] [place], [place] [address]  Chief Complaint  Patient presents with  . Nephrolithiasis    3 month follow up  . Prostate Cancer    HPI: Patient is a 80 year old Caucasian male with a history of prostate cancer, erectile dysfunction and nephrolithiasis who presents today for a 3 month follow up.    Prostate cancer Patient is now out 2 year having completed I-125 interstitial implant for a stage IIa (T2 N0 M0) Gleason 6 (3+3) adenocarcinoma prostate presenting the PSA 4.8.   His last PSA in 01/06/2016 was 0.02.  His IPSS score is 0/0.      IPSS      06/08/16 0800       International Prostate Symptom Score   How often have you had the sensation of not emptying your bladder? Not at All     How often have you had to urinate less than every two hours? Not at All     How often have you found you stopped and started again several times when you urinated? Not at All     How often have you found it difficult to postpone urination? Not at All     How often have you had a weak urinary stream? Not at All     How often have you had to strain to start urination? Not at All     How many times did you typically get up at night to urinate? None     Total IPSS Score 0     Quality of Life due to urinary symptoms   If you were to spend the rest of your life with your urinary condition just the way it is now how would you feel about that? Delighted        Score:  1-7 Mild 8-19 Moderate 20-35 Severe  Erectile dysfunction His SHIM score is 5, which is severe erectile dysfunction.   He has been having difficulty with erections for several years.   His major complaint is no erections.  His libido is preserved.   His risk factors for ED are prostate cancer, age, DM and HTN.  He denies any painful erections or curvatures with his erections.   He has tried  PDE5-inhibitors in the past.        [facility]      06/08/16 0858       SHIM: Over the last 6 months:   How do you rate your confidence that you could get and keep an erection? Very Low     When you had erections with sexual stimulation, how often were your erections hard enough for penetration (entering your partner)? Almost Never or Never     During sexual intercourse, how often were you able to maintain your erection after you had penetrated (entered) your partner? Extremely Difficult     During sexual intercourse, how difficult was it to maintain your erection to completion of intercourse? Extremely Difficult     When you attempted sexual intercourse, how often was it satisfactory for you? Extremely Difficult     SHIM Total Score   SHIM 5        Score: 1-7 Severe ED 8-11 Moderate ED 12-16 Mild-Moderate ED 17-21 Mild ED 22-25 No ED   Nephrolithiasis Patient has a history of uric acid stones. He is taking his Urocit-K  two tablets  at night. He has not had experienced any flank pain or gross hematuria. His UA today is unremarkable.  His urine pH is 5.5  PMH: Past Medical History  Diagnosis Date  . Kidney stone     bilateral  . Prostate cancer ([place])   . Hypertension   . Acid reflux   . Nephrolithiasis 2016  . GERD (gastroesophageal reflux disease)   . Erectile dysfunction   . Elevated PSA   . Microscopic hematuria   . Hypogonadism in male   . BPH (benign prostatic hyperplasia)   . Gross hematuria   . BP (high blood pressure) 10/02/2014  . Kidney stones 06/21/2015  . Renal stones 08/11/2015    Surgical History: Past Surgical History  Procedure Laterality Date  . Prostate surgery      seeds  . Kidney stone surgery    . Extracorporeal shockwave lithotripsy  2016  . Laser lithotripsy and stent placement  2005  . Orthopedic surgery    . Extracorporeal shock wave lithotripsy Right 07/23/2015    Procedure: EXTRACORPOREAL SHOCK WAVE LITHOTRIPSY (ESWL);  Surgeon: Hollice Espy, MD;  Location: [facility] ORS;  Service: Urology;  Laterality: Right;  . Extracorporeal shock wave lithotripsy Left 09/03/2015    Procedure: EXTRACORPOREAL SHOCK WAVE LITHOTRIPSY (ESWL);  Surgeon: Nickie Retort, MD;  Location: [facility] ORS;  Service: Urology;  Laterality: Left;    Home Medications:    Medication List       This list is accurate as of: 06/08/16  9:17 AM.  Always use your most recent med list.               benazepril 5 MG tablet  Commonly known as:  LOTENSIN  Take 5 mg by mouth 2 (two) times daily.     CALCIUM 600 600 MG Tabs tablet  Generic drug:  calcium carbonate  Take by mouth.     FISH OIL + D3 PO  Take 1,200 mg by mouth daily.     Ginkgo Biloba 40 MG Tabs  Take 120 mg by mouth daily.     ibuprofen 200 MG tablet  Commonly known as:  ADVIL,MOTRIN  Take 200 mg by mouth daily.     Lysine 500 MG Caps  Take 500 mg by mouth 2 (two) times daily.     omeprazole 20 MG capsule  Commonly known as:  PRILOSEC  Take 20 mg by mouth 2 (two) times daily before a meal.     potassium citrate 10 MEQ (1080 MG) SR tablet  Commonly known as:  UROCIT-K  1 tablet po four times daily     tamsulosin 0.4 MG Caps capsule  Commonly known as:  FLOMAX  TAKE ONE CAPSULE BY MOUTH DAILY     Vitamin D3 2000 units capsule  Take 2,000 Units by mouth daily.        Allergies:  Allergies  Allergen Reactions  . Other Nausea And Vomiting    Darvocet  . Oxycodone Nausea And Vomiting  . Propoxyphene Nausea And Vomiting    Family History: Family History  Problem Relation Age of Onset  . Hypertension Mother   . Cancer Maternal Grandfather   . Cancer Brother   . Prostate cancer Brother   . Kidney disease Neg Hx     Social History:  reports that he has never smoked. He has never used smokeless tobacco. He reports that he does not drink alcohol or use illicit drugs.  ROS: UROLOGY Frequent Urination?: No Hard to postpone urination?:  No Burning/pain with urination?:  No Get up at night to urinate?: No Leakage of urine?: No Urine stream starts and stops?: No Trouble starting stream?: No Do you have to strain to urinate?: No Blood in urine?: No Urinary tract infection?: No Sexually transmitted disease?: No Injury to kidneys or bladder?: No Painful intercourse?: No Weak stream?: No Erection problems?: No Penile pain?: No  Gastrointestinal Nausea?: No Vomiting?: No Indigestion/heartburn?: No Diarrhea?: No Constipation?: No  Constitutional Fever: No Night sweats?: No Weight loss?: No Fatigue?: No  Skin Skin rash/lesions?: No Itching?: No  Eyes Blurred vision?: No Double vision?: No  Ears/Nose/Throat Sore throat?: No Sinus problems?: No  Hematologic/Lymphatic Swollen glands?: No Easy bruising?: No  Cardiovascular Leg swelling?: No Chest pain?: No  Respiratory Cough?: No Shortness of breath?: No  Endocrine Excessive thirst?: No  Musculoskeletal Back pain?: No Joint pain?: No  Neurological Headaches?: No Dizziness?: No  Psychologic Depression?: No Anxiety?: No  Physical Exam: BP 168/71 mmHg  Pulse 54  Ht 5\' 8"  (1.727 m)  Wt 204 lb 1.6 oz (92.579 kg)  BMI 31.04 kg/m2  Constitutional: Well nourished. Alert and oriented, No acute distress. HEENT: [place] AT, moist mucus membranes. Trachea midline, no masses. Cardiovascular: No clubbing, cyanosis, or edema. Respiratory: Normal respiratory effort, no increased work of breathing. GI: Abdomen is soft, non tender, non distended, no abdominal masses. Liver and spleen not palpable.  No hernias appreciated.  Stool sample for occult testing is not indicated.   GU: No CVA tenderness.  No bladder fullness or masses.  Patient with uncircumcised phallus. Foreskin easily retracted Urethral meatus is patent.  No penile discharge. No penile lesions or rashes. Scrotum without lesions, cysts, rashes and/or edema.  Testicles are located scrotally bilaterally. No masses are appreciated  in the testicles. Left and right epididymis are normal. Rectal: Patient with  normal sphincter tone. Anus and perineum without scarring or rashes. No rectal masses are appreciated. Prostate is approximately 35 grams, no nodules are appreciated. Seminal vesicles are normal. Skin: No rashes, bruises or suspicious lesions. Lymph: No cervical or inguinal adenopathy. Neurologic: Grossly intact, no focal deficits, moving all 4 extremities. Psychiatric: Normal mood and affect.  Laboratory Data: Lab Results  Component Value Date   WBC 4.0 03/08/2016   HGB 12.7* 01/06/2016   HCT 36.2* 03/08/2016   MCV 87 03/08/2016   PLT 179 03/08/2016   Lab Results  Component Value Date   CREATININE 1.09 03/08/2016   Lab Results  Component Value Date   PSA 0.02 01/06/2016   PSA 0.04 06/23/2015   PSA 0.2 03/16/2015   Lab Results  Component Value Date   AST 20 01/06/2016   Lab Results  Component Value Date   ALT 59 01/06/2016    Results for orders placed or performed in visit on 06/08/16  Microscopic Examination  Result Value Ref Range   WBC, UA None seen 0 -  5 /hpf   RBC, UA None seen 0 -  2 /hpf   Epithelial Cells (non renal) None seen 0 - 10 /hpf   Mucus, UA Present (A) Not Estab.   Bacteria, UA None seen None seen/Few  PSA  Result Value Ref Range   Prostate Specific Ag, Serum <0.1 0.0 - 4.0 ng/mL  Urinalysis, Complete  Result Value Ref Range   Specific Gravity, UA <1.005 (L) 1.005 - 1.030   pH, UA 5.5 5.0 - 7.5   Color, UA Yellow Yellow   Appearance Ur Clear Clear   Leukocytes, UA Negative  Negative   Protein, UA Negative Negative/Trace   Glucose, UA Negative Negative   Ketones, UA Negative Negative   RBC, UA Trace (A) Negative   Bilirubin, UA Negative Negative   Urobilinogen, Ur 0.2 0.2 - 1.0 mg/dL   Nitrite, UA Negative Negative   Microscopic Examination See below:   Basic metabolic panel  Result Value Ref Range   Glucose 139 (H) 65 - 99 mg/dL   BUN 20 8 - 27 mg/dL    Creatinine, Ser 1.11 0.76 - 1.27 mg/dL   GFR calc non Af Amer 64 >59 mL/min/1.73   GFR calc Af Amer 74 >59 mL/min/1.73   BUN/Creatinine Ratio 18 10 - 24   Sodium 138 134 - 144 mmol/L   Potassium 4.7 3.5 - 5.2 mmol/L   Chloride 103 96 - 106 mmol/L   CO2 22 18 - 29 mmol/L   Calcium 9.5 8.6 - 10.2 mg/dL  CBC with Differential/Platelet  Result Value Ref Range   WBC 4.2 3.4 - 10.8 x10E3/uL   RBC 4.20 4.14 - 5.80 x10E6/uL   Hemoglobin 12.3 (L) 12.6 - 17.7 g/dL   Hematocrit 35.8 (L) 37.5 - 51.0 %   MCV 85 79 - 97 fL   MCH 29.3 26.6 - 33.0 pg   MCHC 34.4 31.5 - 35.7 g/dL   RDW 14.1 12.3 - 15.4 %   Platelets 188 150 - 379 x10E3/uL   Neutrophils 52 %   Lymphs 33 %   Monocytes 10 %   Eos 3 %   Basos 1 %   Neutrophils Absolute 2.2 1.4 - 7.0 x10E3/uL   Lymphocytes Absolute 1.4 0.7 - 3.1 x10E3/uL   Monocytes Absolute 0.4 0.1 - 0.9 x10E3/uL   EOS (ABSOLUTE) 0.1 0.0 - 0.4 x10E3/uL   Basophils Absolute 0.0 0.0 - 0.2 x10E3/uL   Immature Granulocytes 1 %   Immature Grans (Abs) 0.0 0.0 - 0.1 x10E3/uL   I   Assessment & Plan:    1. Kidney stones:   Patient has a history of uric acid stones.  We will continue his Urocit-K dose  two tablets at night.  He will be returning in 6 months for repeated labs (BMP, CBC and UA).    - Urinalysis, Completeat - CBC with Differential/Platelet - Basic metabolic panel  2. Prostate cancer:   Patient is now out 2 year having completed I-125 interstitial implant for a stage IIa (T2 N0 M0) Gleason 6 (3+3) adenocarcinoma prostate presenting the PSA 4.8.   His last PSA in 12/2015  was 0.02.   He will be returning in 6 months for a PSA, IPSS score, SHIM and exam.    3. Erectile dysfunction:  SHIM score is 5.   I explained to the patient that in order to achieve an erection it takes good functioning of the nervous system (parasympathetic, sympathetic, sensory and motor), good blood flow into the erectile tissue of the penis and a desire to have sex.   I stated that  conditions like diabetes, hypertension, coronary artery disease, peripheral vascular disease, smoking, alcohol consumption, age and BPH can diminish the ability to have an erection.   We trying a different PDE5 inhibitor, intra-urethral suppositories, intracavernous vasoactive drug injection therapy, vacuum constriction device and penile prosthesis implantation.  We discussed trying a different PDE5 inhibitor, intra-urethral suppositories, intracavernous vasoactive drug injection therapy, vacuum constriction device and penile prosthesis implantation.  I have given him Cialis 20 mg samples, # 3.  Advised him to take it 2 hours prior to  want to intercourse on an empty stomach.  He is warned not to take the Cialis with medications that contain nitrates.  I also advised him of the side effects, such as: headache, flushing, dyspepsia, abnormal vision, nasal congestion, back pain, myalgia, nausea, dizziness, and rash.    Return in about 6 months (around 12/08/2016) for IPSS, SHIM, PSA, UA, BMP, CBC, KUB and exam.  These notes generated with voice recognition software. I apologize for typographical errors.  Zara Council, [facility] [address] [place], [place] [address] [phone]

## 2016-06-09 LAB — BASIC METABOLIC PANEL
BUN / CREAT RATIO: 18 (ref 10–24)
BUN: 20 mg/dL (ref 8–27)
CO2: 22 mmol/L (ref 18–29)
CREATININE: 1.11 mg/dL (ref 0.76–1.27)
Calcium: 9.5 mg/dL (ref 8.6–10.2)
Chloride: 103 mmol/L (ref 96–106)
GFR, EST AFRICAN AMERICAN: 74 mL/min/{1.73_m2} (ref >59)
GFR, EST NON AFRICAN AMERICAN: 64 mL/min/{1.73_m2} (ref >59)
GLUCOSE: 139 mg/dL — AB (ref 65–99)
Potassium: 4.7 mmol/L (ref 3.5–5.2)
Sodium: 138 mmol/L (ref 134–144)

## 2016-06-09 LAB — CBC WITH DIFFERENTIAL/PLATELET
BASOS: 1 %
Basophils Absolute: 0 10*3/uL (ref 0.0–0.2)
EOS (ABSOLUTE): 0.1 10*3/uL (ref 0.0–0.4)
EOS: 3 %
HEMOGLOBIN: 12.3 g/dL — AB (ref 12.6–17.7)
Hematocrit: 35.8 % — ABNORMAL LOW (ref 37.5–51.0)
Immature Grans (Abs): 0 10*3/uL (ref 0.0–0.1)
Immature Granulocytes: 1 %
LYMPHS ABS: 1.4 10*3/uL (ref 0.7–3.1)
Lymphs: 33 %
MCH: 29.3 pg (ref 26.6–33.0)
MCHC: 34.4 g/dL (ref 31.5–35.7)
MCV: 85 fL (ref 79–97)
MONOCYTES: 10 %
Monocytes Absolute: 0.4 10*3/uL (ref 0.1–0.9)
Neutrophils Absolute: 2.2 10*3/uL (ref 1.4–7.0)
Neutrophils: 52 %
PLATELETS: 188 10*3/uL (ref 150–379)
RBC: 4.2 x10E6/uL (ref 4.14–5.80)
RDW: 14.1 % (ref 12.3–15.4)
WBC: 4.2 10*3/uL (ref 3.4–10.8)

## 2016-06-09 LAB — PSA: Prostate Specific Ag, Serum: <0.1 ng/mL (ref 0.0–4.0)

## 2016-06-13 ENCOUNTER — Telehealth: Payer: Self-pay

## 2016-06-13 NOTE — Telephone Encounter (Signed)
Spoke with pt wife in reference to lab results. Wife voiced understanding.  

## 2016-06-13 NOTE — Telephone Encounter (Signed)
-----   Message from Nori Riis, PA-C sent at 06/12/2016 10:50 PM EDT ----- Labs are good.  We will see him in 6 months.

## 2016-07-06 ENCOUNTER — Ambulatory Visit
Admission: RE | Admit: 2016-07-06 | Discharge: 2016-07-06 | Disposition: A | Discharge status: Home / Self Care | Payer: Medicare Other | Source: Ambulatory Visit | Attending: Radiation Oncology | Admitting: Radiation Oncology

## 2016-07-06 ENCOUNTER — Other Ambulatory Visit: Payer: BLUE CROSS/BLUE SHIELD

## 2016-07-06 ENCOUNTER — Encounter: Payer: Self-pay | Admitting: Radiation Oncology

## 2016-07-06 ENCOUNTER — Ambulatory Visit: Payer: BLUE CROSS/BLUE SHIELD | Admitting: Oncology

## 2016-07-06 VITALS — BP 138/59 | HR 53 | Temp 95.9°F | Resp 18 | Wt 202.9 lb

## 2016-07-06 DIAGNOSIS — Z510 Encounter for antineoplastic radiation therapy: Secondary | ICD-10-CM | POA: Diagnosis present

## 2016-07-06 DIAGNOSIS — C61 Malignant neoplasm of prostate: Secondary | ICD-10-CM | POA: Insufficient documentation

## 2016-07-06 NOTE — Progress Notes (Signed)
Radiation Oncology Follow up Note  Name: Alex Lang   Date:   07/06/2016 MRN:  [id number] DOB: 17-Jun-1936    This 80 y.o. male presents to the clinic today for 18 month follow-up for prostate cancer status post I-125 interstitial implant.  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 He is seen today in routine follow-up and is doing well. He specifically denies diarrhea dysuria or any other GI/GU complaints. PSA last month was 0.1.Marland Kitchen  COMPLICATIONS OF TREATMENT: none  FOLLOW UP COMPLIANCE: keeps appointments   PHYSICAL EXAM:  BP 138/59 mmHg  Pulse 53  Temp(Src) 95.9 F (35.5 C) (Tympanic)  Resp 18  Wt 202 lb 14.9 oz (92.05 kg) On rectal exam rectal sphincter tone is good. Prostate is smooth contracted without evidence of nodularity or mass. Sulcus is preserved bilaterally. No discrete nodularity is identified. No other rectal abnormalities are noted. Well-developed well-nourished patient in NAD. HEENT reveals PERLA, EOMI, discs not visualized.  Oral cavity is clear. No oral mucosal lesions are identified. Neck is clear without evidence of cervical or supraclavicular adenopathy. Lungs are clear to A&P. Cardiac examination is essentially unremarkable with regular rate and rhythm without murmur rub or thrill. Abdomen is benign with no organomegaly or masses noted. Motor sensory and DTR levels are equal and symmetric in the upper and lower extremities. Cranial nerves II through XII are grossly intact. Proprioception is intact. No peripheral adenopathy or edema is identified. No motor or sensory levels are noted. Crude visual fields are within normal range.  RADIOLOGY RESULTS: No current films for review  PLAN: At the present time he continues to do well under biochemical control of  his prostate cancer. I am please was overall progress. I've asked to see him back in 1 year for follow-up. Patient knows to call sooner with any concerns.  I would like to take this opportunity to thank you for allowing me to participate in the care of your patient.Armstead Peaks., MD

## 2016-07-14 ENCOUNTER — Inpatient Hospital Stay: Payer: Medicare Other | Attending: Oncology | Admitting: Oncology

## 2016-07-14 ENCOUNTER — Inpatient Hospital Stay: Payer: Medicare Other

## 2016-07-14 VITALS — BP 176/65 | HR 54 | Temp 96.3°F | Resp 18 | Wt 204.7 lb

## 2016-07-14 DIAGNOSIS — Z809 Family history of malignant neoplasm, unspecified: Secondary | ICD-10-CM

## 2016-07-14 DIAGNOSIS — Z87442 Personal history of urinary calculi: Secondary | ICD-10-CM | POA: Diagnosis not present

## 2016-07-14 DIAGNOSIS — Z79899 Other long term (current) drug therapy: Secondary | ICD-10-CM | POA: Insufficient documentation

## 2016-07-14 DIAGNOSIS — D72819 Decreased white blood cell count, unspecified: Secondary | ICD-10-CM | POA: Diagnosis not present

## 2016-07-14 DIAGNOSIS — Z8042 Family history of malignant neoplasm of prostate: Secondary | ICD-10-CM | POA: Diagnosis not present

## 2016-07-14 DIAGNOSIS — K219 Gastro-esophageal reflux disease without esophagitis: Secondary | ICD-10-CM

## 2016-07-14 DIAGNOSIS — N529 Male erectile dysfunction, unspecified: Secondary | ICD-10-CM | POA: Diagnosis not present

## 2016-07-14 DIAGNOSIS — I10 Essential (primary) hypertension: Secondary | ICD-10-CM | POA: Insufficient documentation

## 2016-07-14 DIAGNOSIS — E291 Testicular hypofunction: Secondary | ICD-10-CM | POA: Insufficient documentation

## 2016-07-14 DIAGNOSIS — N400 Enlarged prostate without lower urinary tract symptoms: Secondary | ICD-10-CM | POA: Diagnosis not present

## 2016-07-14 DIAGNOSIS — C61 Malignant neoplasm of prostate: Secondary | ICD-10-CM | POA: Diagnosis present

## 2016-07-14 LAB — COMPREHENSIVE METABOLIC PANEL
ALBUMIN: 4.1 g/dL (ref 3.5–5.0)
ALK PHOS: 91 U/L (ref 38–126)
ALT: 57 U/L (ref 17–63)
ANION GAP: 8 (ref 5–15)
AST: 25 U/L (ref 15–41)
BILIRUBIN TOTAL: 0.7 mg/dL (ref 0.3–1.2)
BUN: 29 mg/dL — ABNORMAL HIGH (ref 6–20)
CALCIUM: 9.1 mg/dL (ref 8.9–10.3)
CO2: 22 mmol/L (ref 22–32)
CREATININE: 1.06 mg/dL (ref 0.61–1.24)
Chloride: 104 mmol/L (ref 101–111)
Glucose, Bld: 136 mg/dL — ABNORMAL HIGH (ref 65–99)
Potassium: 4.5 mmol/L (ref 3.5–5.1)
Sodium: 134 mmol/L — ABNORMAL LOW (ref 135–145)
TOTAL PROTEIN: 6.9 g/dL (ref 6.5–8.1)

## 2016-07-14 LAB — CBC WITH DIFFERENTIAL/PLATELET
BASOS PCT: 1 %
Basophils Absolute: 0 10*3/uL (ref 0–0.1)
Eosinophils Absolute: 0.1 10*3/uL (ref 0–0.7)
Eosinophils Relative: 4 %
HEMATOCRIT: 33.7 % — AB (ref 40.0–52.0)
HEMOGLOBIN: 12.2 g/dL — AB (ref 13.0–18.0)
LYMPHS ABS: 1.1 10*3/uL (ref 1.0–3.6)
LYMPHS PCT: 31 %
MCH: 30.6 pg (ref 26.0–34.0)
MCHC: 36.2 g/dL — AB (ref 32.0–36.0)
MCV: 84.5 fL (ref 80.0–100.0)
MONO ABS: 0.4 10*3/uL (ref 0.2–1.0)
MONOS PCT: 11 %
NEUTROS ABS: 1.9 10*3/uL (ref 1.4–6.5)
NEUTROS PCT: 53 %
Platelets: 175 10*3/uL (ref 150–440)
RBC: 3.99 MIL/uL — ABNORMAL LOW (ref 4.40–5.90)
RDW: 13.4 % (ref 11.5–14.5)
WBC: 3.6 10*3/uL — ABNORMAL LOW (ref 3.8–10.6)

## 2016-07-14 LAB — PSA: PSA: 0.05 ng/mL (ref 0.00–4.00)

## 2016-07-14 NOTE — Progress Notes (Signed)
States is feeling well today. Offers no complaints. 

## 2016-07-17 NOTE — Progress Notes (Signed)
Alex Lang  [phone] [phone]  ID: VICTORIOUS KIMERY OB: [birth year]  MR#: [id number]  [id number]  Patient Care Team: Madelyn Brunner, MD as PCP - General (Internal Medicine)  CHIEF COMPLAINT:  Prostate cancer, stage TIIa N0 M0 Gleason 4+3.  INTERVAL HISTORY: Patient returns to clinic today for routine six-month follow-up. He continues to feel well and remains asymptomatic. He has no neurologic complaints. He denies any recent fevers or illnesses. He is a good appetite and denies weight loss. He has no chest pain or shortness of breath. He denies any nausea, vomiting, constipation, or diarrhea. He has no urinary complaints. Patient feels at his baseline and offers no specific complaints today.  REVIEW OF SYSTEMS:   Review of Systems  Constitutional: Negative.  Negative for fever, malaise/fatigue and weight loss.  Respiratory: Negative.  Negative for shortness of breath.   Cardiovascular: Negative.  Negative for chest pain.  Gastrointestinal: Negative.  Negative for abdominal pain.  Genitourinary: Negative.   Neurological: Negative.  Negative for weakness.  Psychiatric/Behavioral: Negative.     As per HPI. Otherwise, a complete review of systems is negatve.  PAST MEDICAL HISTORY: Past Medical History:  Diagnosis Date  . Acid reflux   . BP (high blood pressure) 10/02/2014  . BPH (benign prostatic hyperplasia)   . Elevated PSA   . Erectile dysfunction   . GERD (gastroesophageal reflux disease)   . Gross hematuria   . Hypertension   . Hypogonadism in male   . Kidney stone    bilateral  . Kidney stones 06/21/2015  . Microscopic hematuria   . Nephrolithiasis 2016  . Prostate cancer ([place])   . Renal stones 08/11/2015    PAST SURGICAL HISTORY: Past Surgical History:  Procedure Laterality Date  . EXTRACORPOREAL SHOCK WAVE LITHOTRIPSY Right 07/23/2015   Procedure: EXTRACORPOREAL SHOCK WAVE LITHOTRIPSY (ESWL);  Surgeon: Hollice Espy, MD;  Location: [facility] ORS;  Service: Urology;  Laterality: Right;  . EXTRACORPOREAL SHOCK WAVE LITHOTRIPSY Left 09/03/2015   Procedure: EXTRACORPOREAL SHOCK WAVE LITHOTRIPSY (ESWL);  Surgeon: Nickie Retort, MD;  Location: [facility] ORS;  Service: Urology;  Laterality: Left;  . extracorporeal shockwave lithotripsy  2016  . KIDNEY STONE SURGERY    . laser lithotripsy and stent placement  2005  . ORTHOPEDIC SURGERY    . PROSTATE SURGERY     seeds    FAMILY HISTORY: Family History  Problem Relation Age of Onset  . Hypertension Mother   . Cancer Maternal Grandfather   . Cancer Brother   . Prostate cancer Brother   . Kidney disease Neg Hx        ADVANCED DIRECTIVES:    HEALTH MAINTENANCE: Social History  Substance Use Topics  . Smoking status: Never Smoker  . Smokeless tobacco: Never Used  . Alcohol use No     Colonoscopy:  PAP:  Bone density:  Lipid panel:  Allergies  Allergen Reactions  . Other Nausea And Vomiting    Darvocet  . Oxycodone Nausea And Vomiting  . Propoxyphene Nausea And Vomiting    Current Outpatient Prescriptions  Medication Sig Dispense Refill  . benazepril (LOTENSIN) 5 MG tablet Take 5 mg by mouth 2 (two) times daily.     . calcium carbonate (CALCIUM 600) 600 MG TABS tablet Take by mouth.    . Cholecalciferol (VITAMIN D3) 2000 UNITS capsule Take 2,000 Units by mouth daily.     . Fish Oil-Cholecalciferol (FISH OIL + D3 PO) Take 1,200 mg by mouth  daily.     . Ginkgo Biloba 40 MG TABS Take 120 mg by mouth daily.     Marland Kitchen ibuprofen (ADVIL,MOTRIN) 200 MG tablet Take 200 mg by mouth daily.    Marland Kitchen Lysine 500 MG CAPS Take 500 mg by mouth 2 (two) times daily.     Marland Kitchen omeprazole (PRILOSEC) 20 MG capsule Take 20 mg by mouth 2 (two) times daily before a meal.     . potassium citrate (UROCIT-K) 10 MEQ (1080 MG) SR tablet 1 tablet po four times daily (Patient taking differently: 20 mEq 2 (two) times daily. 1 tablet po four times daily) 270 tablet 3  .  tamsulosin (FLOMAX) 0.4 MG CAPS capsule TAKE ONE CAPSULE BY MOUTH DAILY 90 capsule 3   No current facility-administered medications for this visit.     OBJECTIVE: Vitals:   07/14/16 0947  BP: (!) 176/65  Pulse: (!) 54  Resp: 18  Temp: (!) 96.3 F (35.7 C)     Body mass index is 31.12 kg/m.    ECOG FS:0 - Asymptomatic  General: Well-developed, well-nourished, no acute distress. Eyes: Pink conjunctiva, anicteric sclera. HEENT: Normocephalic, moist mucous membranes, clear oropharnyx. Lungs: Clear to auscultation bilaterally. Heart: Regular rate and rhythm. No rubs, murmurs, or gallops. Abdomen: Soft, nontender, nondistended. No organomegaly noted, normoactive bowel sounds. Musculoskeletal: No edema, cyanosis, or clubbing. Neuro: Alert, answering all questions appropriately. Cranial nerves grossly intact. Skin: No rashes or petechiae noted. Psych: Normal affect. Lymphatics: No cervical, calvicular, axillary or inguinal LAD.   LAB RESULTS:  Lab Results  Component Value Date   NA 134 (L) 07/14/2016   K 4.5 07/14/2016   CL 104 07/14/2016   CO2 22 07/14/2016   GLUCOSE 136 (H) 07/14/2016   BUN 29 (H) 07/14/2016   CREATININE 1.06 07/14/2016   CALCIUM 9.1 07/14/2016   PROT 6.9 07/14/2016   ALBUMIN 4.1 07/14/2016   AST 25 07/14/2016   ALT 57 07/14/2016   ALKPHOS 91 07/14/2016   BILITOT 0.7 07/14/2016   GFRNONAA >60 07/14/2016   GFRAA >60 07/14/2016    Lab Results  Component Value Date   WBC 3.6 (L) 07/14/2016   NEUTROABS 1.9 07/14/2016   HGB 12.2 (L) 07/14/2016   HCT 33.7 (L) 07/14/2016   MCV 84.5 07/14/2016   PLT 175 07/14/2016   Lab Results  Component Value Date   PSA 0.05 07/14/2016     STUDIES: No results found.  ASSESSMENT: Prostate cancer, stage TIIa N0 M0 Gleason 4+3.  PLAN:    1. Prostate cancer:  Patient received radioactive seed implants in January 2015. He also initiated adjuvant deprivation therapy approximate same time. He received his last  dose of Lupron in March 2016. Patient's PSA continues to be less than 0.1. No intervention is needed at this time. Patient expressed understanding that if his PSA begins to increase, he may have to reinitiate Lupron. Return to clinic in January 2018. At which point he can follow-up yearly which would be alternating every 6 months with radiation oncology. Continue follow-up with urology as scheduled. 2. Leukopenia: Mild, monitor. 3. Hypertension: Patient's blood pressure is significantly elevated today. Continue current medications. Treatment per primary care.  Patient expressed understanding and was in agreement with this plan. He also understands that He can call clinic at any time with any questions, concerns, or complaints.   Approximately 30 minutes was spent in discussion of which greater than 50% was consultation.  Lloyd Huger, MD   07/17/2016 7:51 AM

## 2016-08-24 IMAGING — CR DG ABDOMEN 1V
1 series · 2 of 2 positions shown · non-contrast
Comparison: Ultrasound 08/31/2015, KUB 08/11/2015, CT 07/06/2015

CLINICAL DATA: Left-sided kidney stone. Right-sided kidney stones
taking care of 5 weeks ago. Preprocedure lithotripsy exam.

EXAM:
ABDOMEN - 1 VIEW

[Series 1: dg abd 1 view · 0.14mm/px · 2 of 2 slices shown]
[im 1/2]
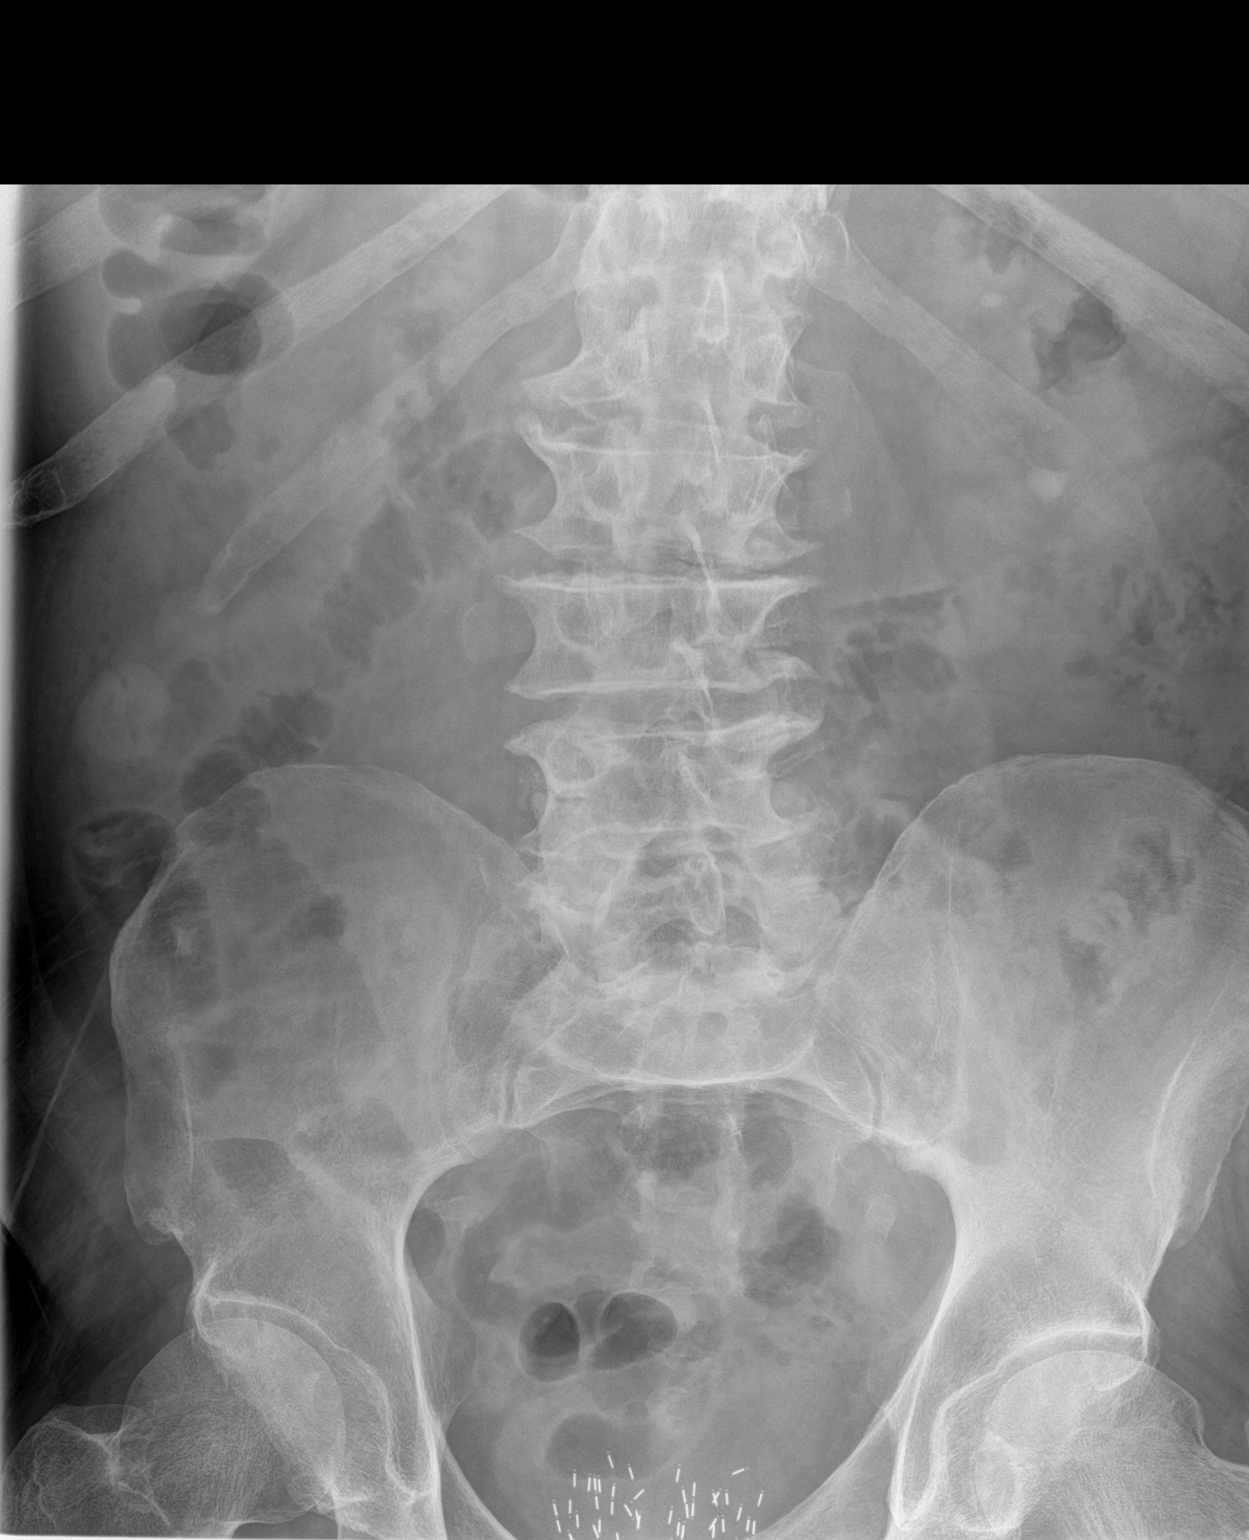
[im 2/2]
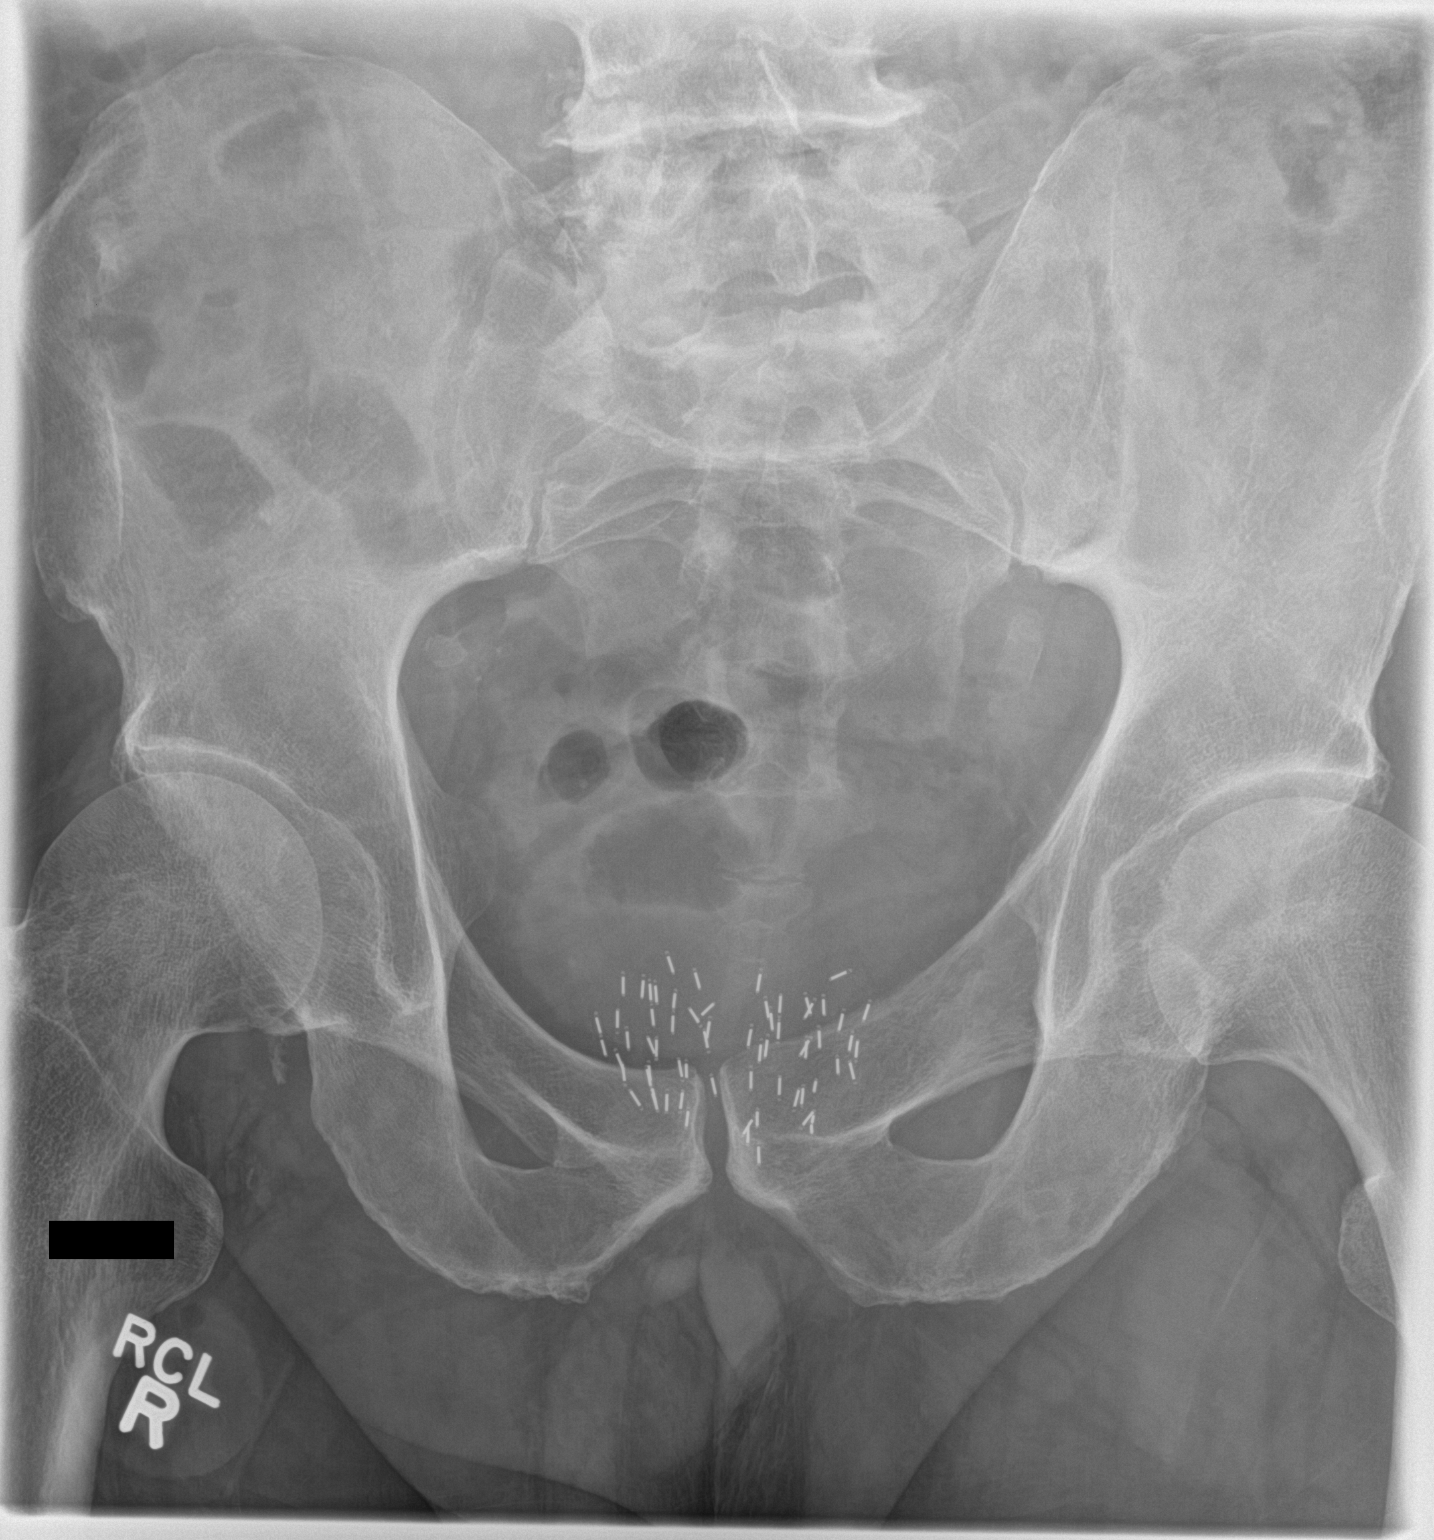

[2 of 2 positions shown; findings below may reference images not displayed]

FINDINGS: Left-sided intrarenal calculi measure in the upper pole and 10 mm in
the lower pole, similar in appearance to the previous exam. No
definite ureteral stones are identified. There is moderate
degenerative change throughout the lumbar spine. Bowel gas pattern
is nonobstructive. Prostatic seeds overlie the symphysis pubis
consistent with prior prostatic radiation therapy.
IMPRESSION: 1. Unchanged appearance of left intrarenal calculi.
2. No visualize right intrarenal calculi.

## 2016-09-07 IMAGING — CR DG ABDOMEN 1V
1 series · 2 of 2 positions shown · non-contrast
Comparison: 09/03/2015

CLINICAL DATA: KUB to check abdomen due to lithotripsy x2weeks ago
on left kidney. Pt states he's been passing stones successfully
since then. Pmh: lithotripsy on right kidney back in [REDACTED]. Pt was
diagnosed with prostate Ca last [REDACTED].

EXAM:
ABDOMEN - 1 VIEW

[Series 1: dg abd 1 view · 0.14mm/px · 2 of 2 slices shown]
[im 1/2]
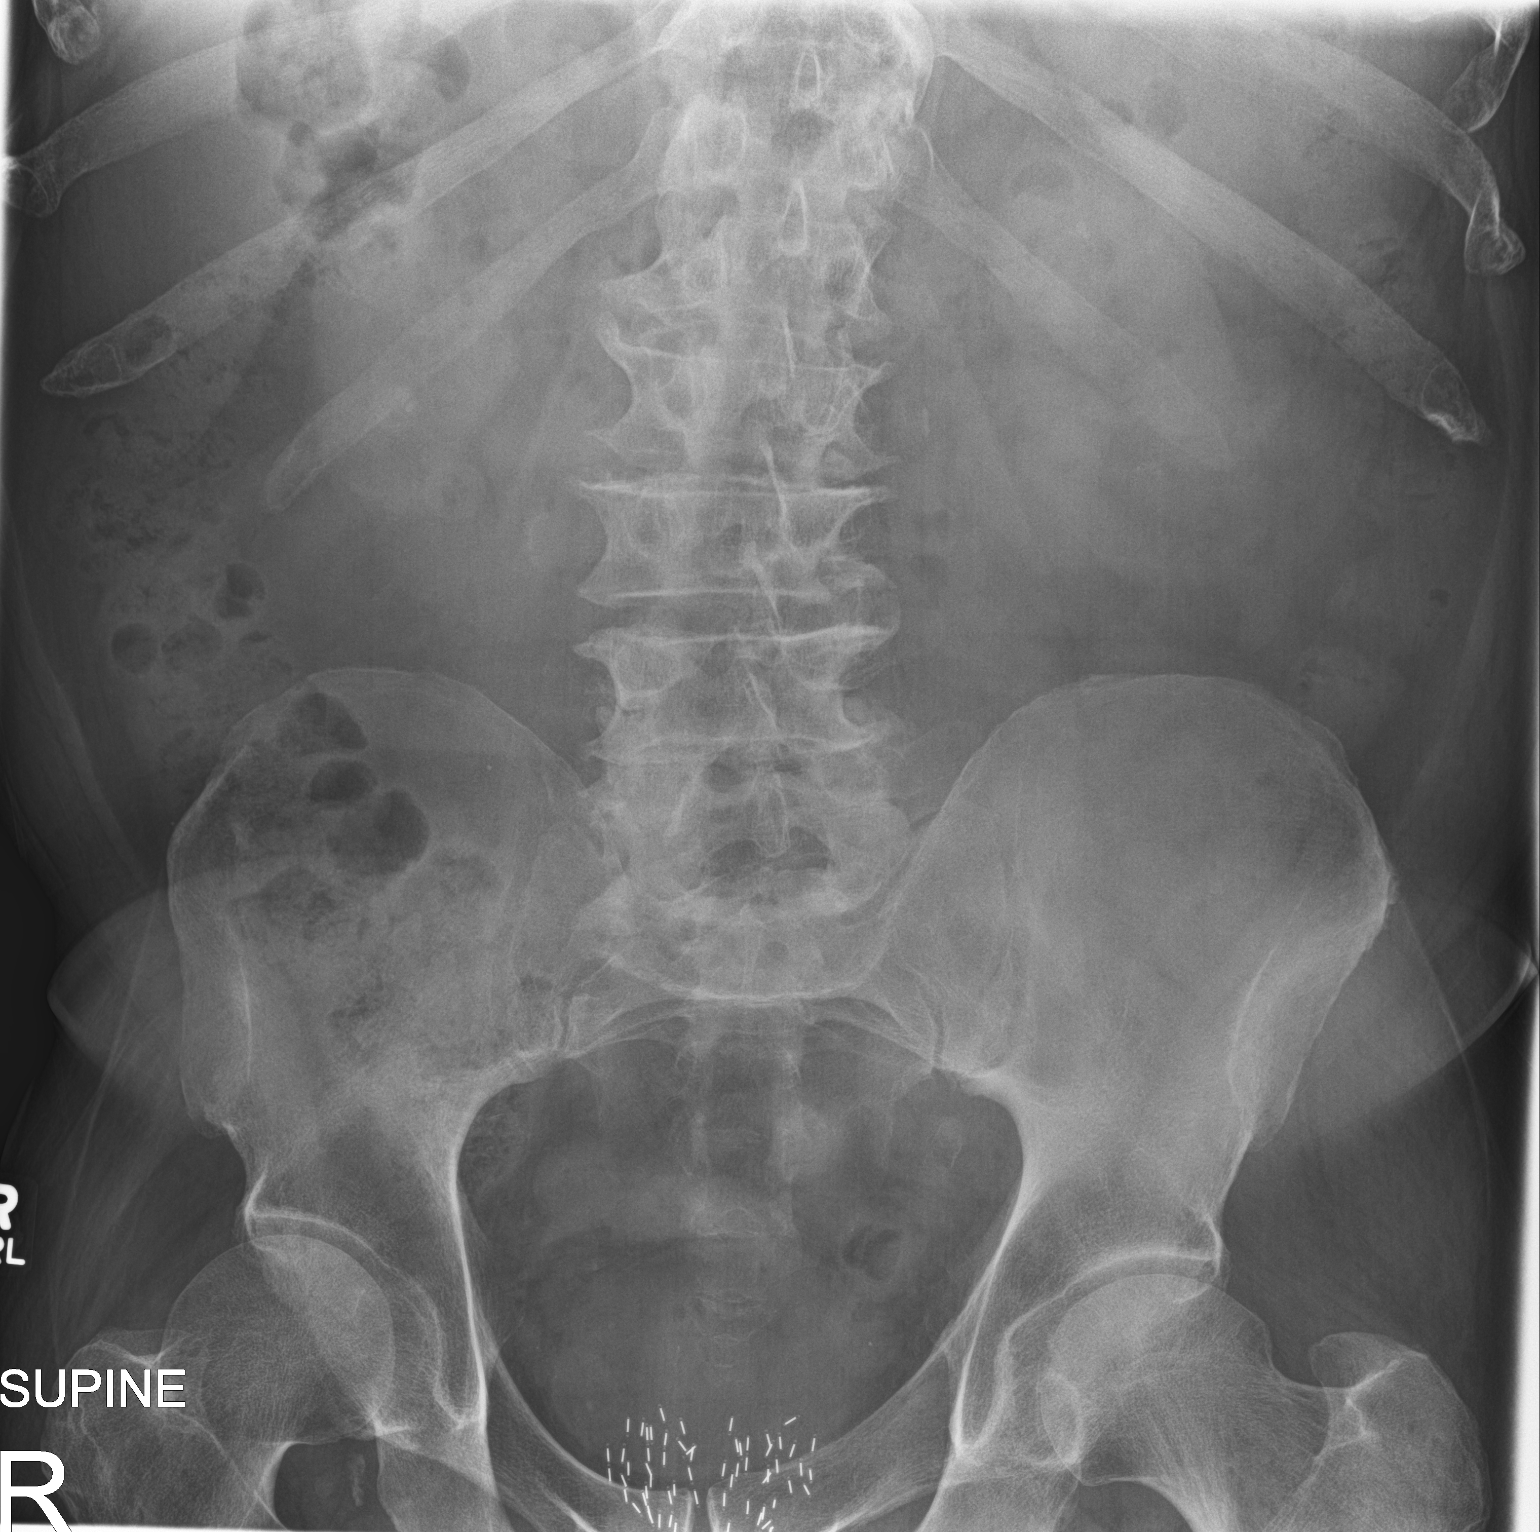
[im 2/2]
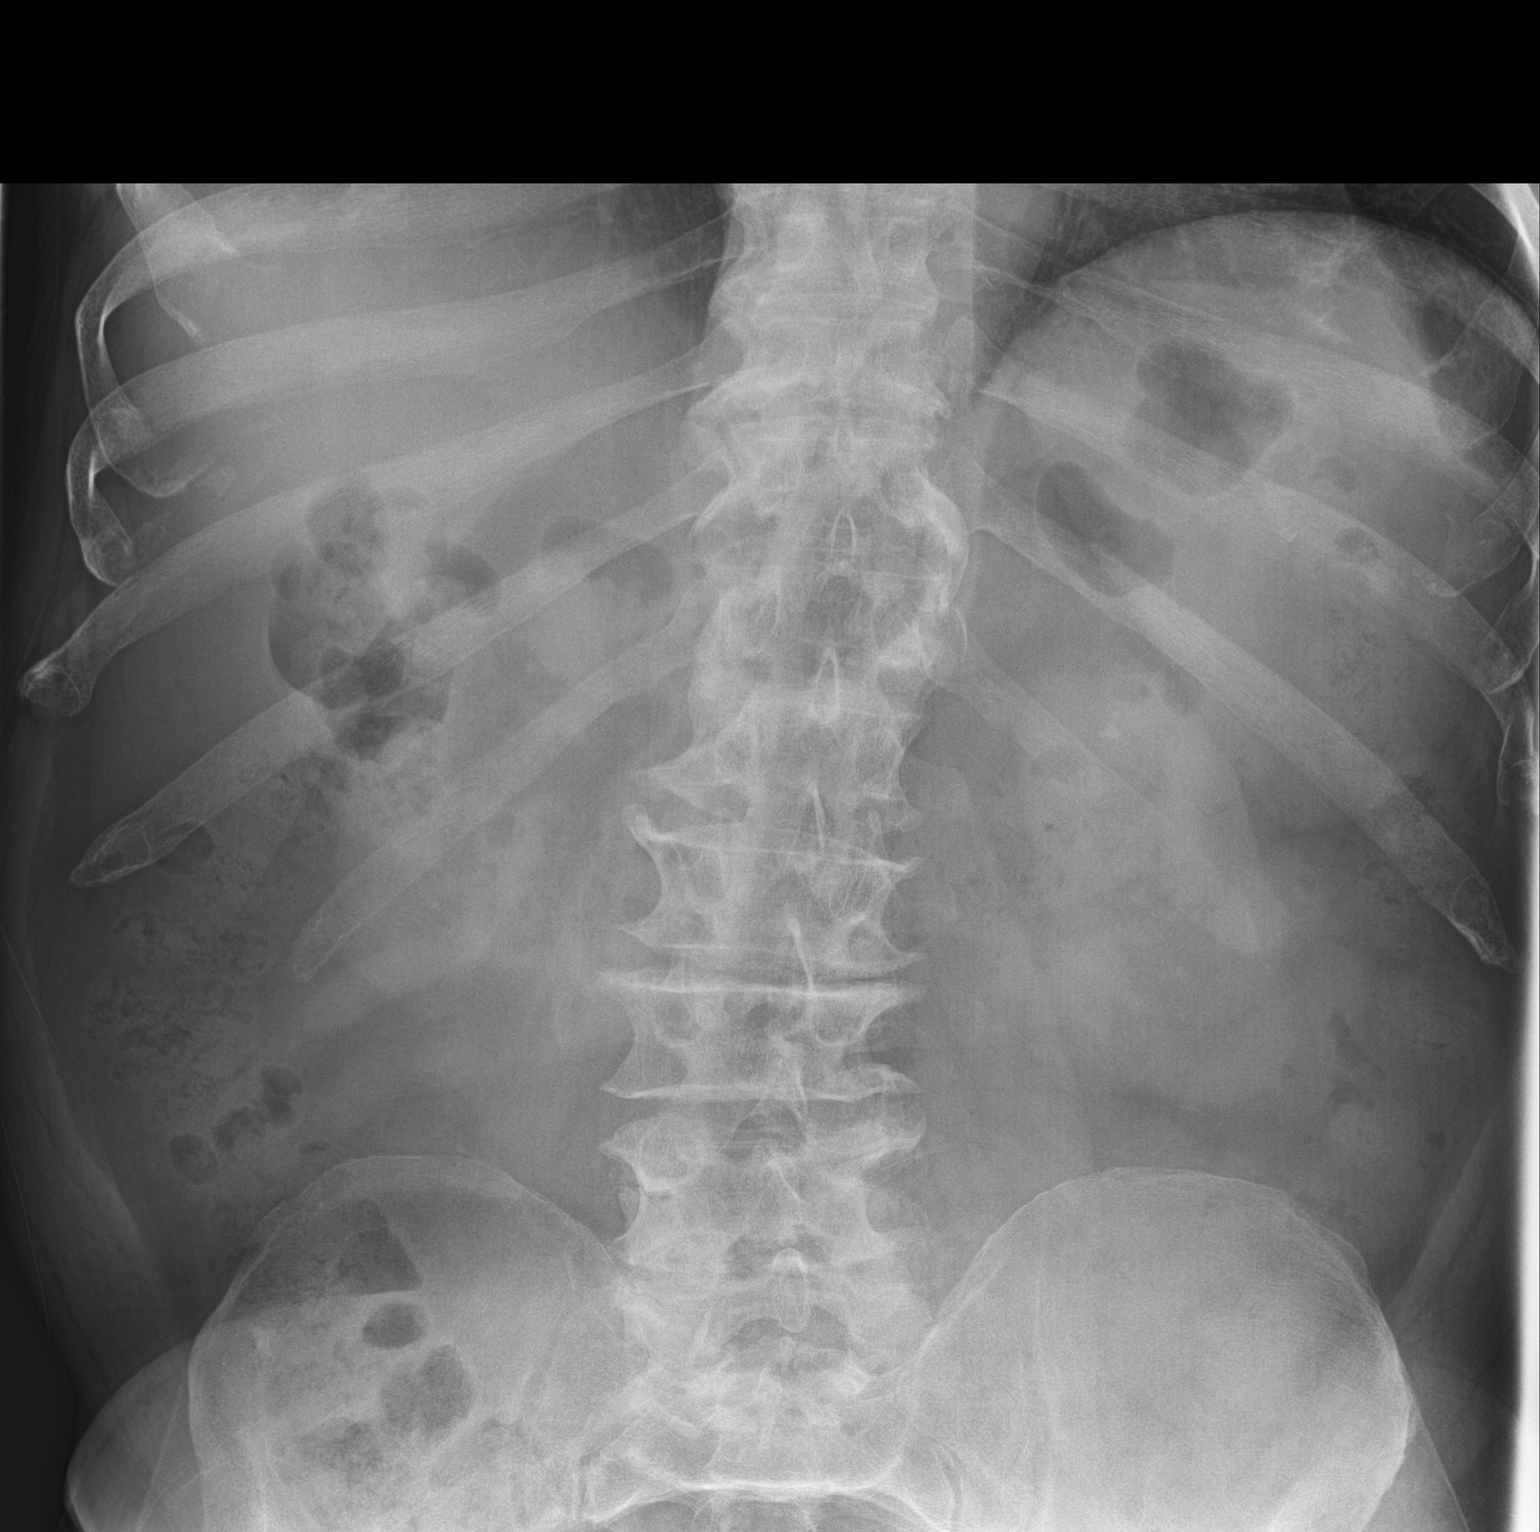

[2 of 2 positions shown; findings below may reference images not displayed]

FINDINGS: 5 mm stone or cluster projects in the region of the upper pole left
renal collecting system, and a smaller cluster of stones up to 3 mm
projects in the lower pole.

Normal bowel gas pattern.

Mild spondylitic changes at in the lower thoracic and lumbar spine.
Metallic seeds in the region of the prostate.
IMPRESSION: 1. Small residual upper and lower pole left renal calculi.

## 2016-11-17 ENCOUNTER — Other Ambulatory Visit: Payer: Self-pay

## 2016-11-17 DIAGNOSIS — N200 Calculus of kidney: Secondary | ICD-10-CM

## 2016-11-24 ENCOUNTER — Other Ambulatory Visit: Payer: Medicare Other

## 2016-11-28 ENCOUNTER — Other Ambulatory Visit: Payer: Self-pay

## 2016-11-28 ENCOUNTER — Ambulatory Visit
Admission: RE | Admit: 2016-11-28 | Discharge: 2016-11-28 | Disposition: A | Discharge status: Home / Self Care | Payer: Medicare Other | Source: Ambulatory Visit | Attending: Urology | Admitting: Urology

## 2016-11-28 ENCOUNTER — Other Ambulatory Visit: Payer: Medicare Other

## 2016-11-28 DIAGNOSIS — N200 Calculus of kidney: Secondary | ICD-10-CM

## 2016-11-28 LAB — URINALYSIS, COMPLETE
BILIRUBIN UA: NEGATIVE
GLUCOSE, UA: NEGATIVE
KETONES UA: NEGATIVE
Leukocytes, UA: NEGATIVE
NITRITE UA: NEGATIVE
PROTEIN UA: NEGATIVE
RBC UA: NEGATIVE
SPEC GRAV UA: 1.02 (ref 1.005–1.030)
UUROB: 0.2 mg/dL (ref 0.2–1.0)
pH, UA: 5 (ref 5.0–7.5)

## 2016-11-28 LAB — MICROSCOPIC EXAMINATION
Bacteria, UA: NONE SEEN
Epithelial Cells (non renal): NONE SEEN /HPF (ref 0–10)
WBC, UA: NONE SEEN /HPF (ref 0–?)

## 2016-11-29 LAB — CBC
Hematocrit: 35.5 % — ABNORMAL LOW (ref 37.5–51.0)
Hemoglobin: 12.1 g/dL — ABNORMAL LOW (ref 13.0–17.7)
MCH: 28.7 pg (ref 26.6–33.0)
MCHC: 34.1 g/dL (ref 31.5–35.7)
MCV: 84 fL (ref 79–97)
PLATELETS: 212 10*3/uL (ref 150–379)
RBC: 4.22 x10E6/uL (ref 4.14–5.80)
RDW: 13.9 % (ref 12.3–15.4)
WBC: 4.7 10*3/uL (ref 3.4–10.8)

## 2016-11-29 LAB — BASIC METABOLIC PANEL
BUN / CREAT RATIO: 23 (ref 10–24)
BUN: 27 mg/dL (ref 8–27)
CHLORIDE: 104 mmol/L (ref 96–106)
CO2: 23 mmol/L (ref 18–29)
Calcium: 9.6 mg/dL (ref 8.6–10.2)
Creatinine, Ser: 1.2 mg/dL (ref 0.76–1.27)
GFR calc Af Amer: 67 mL/min/{1.73_m2} (ref >59)
GFR calc non Af Amer: 58 mL/min/{1.73_m2} — ABNORMAL LOW (ref >59)
GLUCOSE: 117 mg/dL — AB (ref 65–99)
Potassium: 5.5 mmol/L — ABNORMAL HIGH (ref 3.5–5.2)
SODIUM: 140 mmol/L (ref 134–144)

## 2016-12-01 ENCOUNTER — Ambulatory Visit: Payer: Medicare Other | Admitting: Urology

## 2016-12-05 ENCOUNTER — Telehealth: Payer: Self-pay | Admitting: *Deleted

## 2016-12-05 ENCOUNTER — Ambulatory Visit (INDEPENDENT_AMBULATORY_CARE_PROVIDER_SITE_OTHER): Payer: Medicare Other | Admitting: Urology

## 2016-12-05 ENCOUNTER — Encounter: Payer: Self-pay | Admitting: Urology

## 2016-12-05 ENCOUNTER — Other Ambulatory Visit: Payer: Self-pay | Admitting: *Deleted

## 2016-12-05 VITALS — BP 160/63 | HR 54 | Ht 69.0 in | Wt 206.4 lb

## 2016-12-05 DIAGNOSIS — N200 Calculus of kidney: Secondary | ICD-10-CM

## 2016-12-05 DIAGNOSIS — C61 Malignant neoplasm of prostate: Secondary | ICD-10-CM

## 2016-12-05 DIAGNOSIS — N529 Male erectile dysfunction, unspecified: Secondary | ICD-10-CM | POA: Diagnosis not present

## 2016-12-05 MED ORDER — POTASSIUM CITRATE ER 10 MEQ (1080 MG) PO TBCR: EXTENDED_RELEASE_TABLET | ORAL | 3 refills | Status: DC

## 2016-12-05 NOTE — Progress Notes (Signed)
9:10 AM   Alex Lang Apr 04, 1936 [id number]  Referring provider: Madelyn Brunner, MD [place] [facility] [place], [place] [address]  Chief Complaint  Patient presents with  . Nephrolithiasis    6 month follow up and history of prostate cancer  . Erectile Dysfunction    HPI: Patient is a 80 year old Caucasian male with a history of prostate cancer, erectile dysfunction and nephrolithiasis who presents today for a 3 month follow up.    Prostate cancer Patient with cT2a Prostate cancer- Dx in 10/2014 with Gleason 3+4, 3+3 prostate cancer involving 8 of 13 cores, iPSA 4.8 DRE also somewhat concerning for cancer with questionable induration at right base. TRUS vol 45 cc.  He underwent I-125 brachytherapy seed placement on 01/26/2015 by Dr. Hollice Espy. He is also receiving adjunctive ADT therapy for 4-6 months.  He is also being followed by the Cancer center and they will be seeing him in January.   His last PSA in 07/14/2016 was 0.05.  His IPS'S score today was 0/0.  His previous IPSS score was 0/0.      IPSS    Row Name 12/05/16 0800         International Prostate Symptom Score   How often have you had the sensation of not emptying your bladder? Not at All     How often have you had to urinate less than every two hours? Not at All     How often have you found you stopped and started again several times when you urinated? Not at All     How often have you found it difficult to postpone urination? Not at All     How often have you had a weak urinary stream? Not at All     How often have you had to strain to start urination? Not at All     How many times did you typically get up at night to urinate? None     Total IPSS Score 0       Quality of Life due to urinary symptoms   If you were to spend the rest of your life with your urinary condition just the way it is now how would you feel about that? Delighted        Score:  1-7 Mild 8-19  Moderate 20-35 Severe  Erectile dysfunction His SHIM score is 6, which is severe erectile dysfunction.   His previous SHIM was 5.  He has been having difficulty with erections for several years.   His major complaint is no erections.  His libido is preserved.   His risk factors for ED are prostate cancer, age, DM and HTN.  He denies any painful erections or curvatures with his erections.   He has tried PDE5-inhibitors in the past.        [place] Name 12/05/16 0841         SHIM: Over the last 6 months:   How do you rate your confidence that you could get and keep an erection? Very Low     When you had erections with sexual stimulation, how often were your erections hard enough for penetration (entering your partner)? Almost Never or Never     During sexual intercourse, how often were you able to maintain your erection after you had penetrated (entered) your partner? Extremely Difficult     During sexual intercourse, how difficult was it to maintain your erection to  completion of intercourse? Extremely Difficult     When you attempted sexual intercourse, how often was it satisfactory for you? Very Difficult       SHIM Total Score   SHIM 6        Score: 1-7 Severe ED 8-11 Moderate ED 12-16 Mild-Moderate ED 17-21 Mild ED 22-25 No ED   Nephrolithiasis Patient has a history of uric acid stones. He is taking his Urocit-K  two tablets at night. He has not had experienced any flank pain or gross hematuria.   His urine pH is 5.0 was 11/28/2016.  KUB taken 11/28/2016 noted probable multiple tiny stones overlying the left kidney no definite right-sided kidney stones are observed but the study is quite limited due to overlying gas and stool.  Possible ileus or gastroenteritis bowel gas pattern.  I personally reviewed the films.  His potassium has risen from 4.5 to 5.5.    PMH: Past Medical History:  Diagnosis Date  . Acid reflux   . BP (high blood pressure) 10/02/2014  . BPH (benign  prostatic hyperplasia)   . Elevated PSA   . Erectile dysfunction   . GERD (gastroesophageal reflux disease)   . Gross hematuria   . Hypertension   . Hypogonadism in male   . Kidney stone    bilateral  . Kidney stones 06/21/2015  . Microscopic hematuria   . Nephrolithiasis 2016  . Prostate cancer ([place])   . Renal stones 08/11/2015    Surgical History: Past Surgical History:  Procedure Laterality Date  . EXTRACORPOREAL SHOCK WAVE LITHOTRIPSY Right 07/23/2015   Procedure: EXTRACORPOREAL SHOCK WAVE LITHOTRIPSY (ESWL);  Surgeon: Hollice Espy, MD;  Location: [facility] ORS;  Service: Urology;  Laterality: Right;  . EXTRACORPOREAL SHOCK WAVE LITHOTRIPSY Left 09/03/2015   Procedure: EXTRACORPOREAL SHOCK WAVE LITHOTRIPSY (ESWL);  Surgeon: Nickie Retort, MD;  Location: [facility] ORS;  Service: Urology;  Laterality: Left;  . extracorporeal shockwave lithotripsy  2016  . KIDNEY STONE SURGERY    . laser lithotripsy and stent placement  2005  . ORTHOPEDIC SURGERY    . PROSTATE SURGERY     seeds    Home Medications:  Allergies as of 12/05/2016      Reactions   Other Nausea And Vomiting   Darvocet   Oxycodone Nausea And Vomiting   Propoxyphene Nausea And Vomiting      Medication List       Accurate as of 12/05/16  9:10 AM. Always use your most recent med list.          benazepril 5 MG tablet Commonly known as:  LOTENSIN Take 5 mg by mouth 2 (two) times daily.   benazepril 5 MG tablet Commonly known as:  LOTENSIN Take by mouth.   CALCIUM 600 600 MG Tabs tablet Generic drug:  calcium carbonate Take by mouth.   FISH OIL + D3 PO Take 1,200 mg by mouth daily.   Ginkgo Biloba 40 MG Tabs Take 120 mg by mouth daily.   ibuprofen 200 MG tablet Commonly known as:  ADVIL,MOTRIN Take 200 mg by mouth daily.   Lysine 500 MG Caps Take 500 mg by mouth 2 (two) times daily.   omeprazole 20 MG capsule Commonly known as:  PRILOSEC Take 20 mg by mouth 2 (two) times daily before a meal.     omeprazole 20 MG capsule Commonly known as:  PRILOSEC Take by mouth.   potassium citrate 10 MEQ (1080 MG) SR tablet Commonly known as:  UROCIT-K 1 tablet one time  daily   tamsulosin 0.4 MG Caps capsule Commonly known as:  FLOMAX TAKE ONE CAPSULE BY MOUTH DAILY   Vitamin D3 2000 units capsule Take 2,000 Units by mouth daily.       Allergies:  Allergies  Allergen Reactions  . Other Nausea And Vomiting    Darvocet  . Oxycodone Nausea And Vomiting  . Propoxyphene Nausea And Vomiting    Family History: Family History  Problem Relation Age of Onset  . Hypertension Mother   . Cancer Maternal Grandfather   . Cancer Brother   . Prostate cancer Brother   . Kidney disease Neg Hx   . Bladder Cancer Neg Hx     Social History:  reports that he has never smoked. He has never used smokeless tobacco. He reports that he does not drink alcohol or use drugs.  ROS: UROLOGY Frequent Urination?: No Hard to postpone urination?: No Burning/pain with urination?: No Get up at night to urinate?: No Leakage of urine?: No Urine stream starts and stops?: No Trouble starting stream?: No Do you have to strain to urinate?: No Blood in urine?: No Urinary tract infection?: No Sexually transmitted disease?: No Injury to kidneys or bladder?: No Painful intercourse?: No Weak stream?: No Erection problems?: No Penile pain?: No  Gastrointestinal Nausea?: No Vomiting?: No Indigestion/heartburn?: No Diarrhea?: No Constipation?: No  Constitutional Fever: No Night sweats?: No Weight loss?: No Fatigue?: No  Skin Skin rash/lesions?: No Itching?: No  Eyes Blurred vision?: No Double vision?: No  Ears/Nose/Throat Sore throat?: No Sinus problems?: No  Hematologic/Lymphatic Swollen glands?: No Easy bruising?: No  Cardiovascular Leg swelling?: No Chest pain?: No  Respiratory Cough?: No Shortness of breath?: No  Endocrine Excessive thirst?: No  Musculoskeletal Back  pain?: No Joint pain?: No  Neurological Headaches?: No Dizziness?: No  Psychologic Depression?: No Anxiety?: No  Physical Exam: BP (!) 160/63   Pulse (!) 54   Ht 5\' 9"  (1.753 m)   Wt 206 lb 6.4 oz (93.6 kg)   BMI 30.48 kg/m   Constitutional: Well nourished. Alert and oriented, No acute distress. HEENT: [place] AT, moist mucus membranes. Trachea midline, no masses. Cardiovascular: No clubbing, cyanosis, or edema. Respiratory: Normal respiratory effort, no increased work of breathing. GI: Abdomen is soft, non tender, non distended, no abdominal masses. Liver and spleen not palpable.  No hernias appreciated.  Stool sample for occult testing is not indicated.   GU: No CVA tenderness.  No bladder fullness or masses.  Patient with uncircumcised phallus. Foreskin easily retracted  Urethral meatus is patent.  No penile discharge. No penile lesions or rashes. Scrotum without lesions, cysts, rashes and/or edema.  Testicles are located scrotally bilaterally. No masses are appreciated in the testicles. Left and right epididymis are normal. Rectal: Patient with  normal sphincter tone. Anus and perineum without scarring or rashes. No rectal masses are appreciated. Prostate is approximately 35 grams, no nodules are appreciated. Seminal vesicles are normal. Skin: No rashes, bruises or suspicious lesions. Lymph: No cervical or inguinal adenopathy. Neurologic: Grossly intact, no focal deficits, moving all 4 extremities. Psychiatric: Normal mood and affect.  Laboratory Data: Lab Results  Component Value Date   WBC 4.7 11/28/2016   HGB 12.2 (L) 07/14/2016   HCT 35.5 (L) 11/28/2016   MCV 84 11/28/2016   PLT 212 11/28/2016   Lab Results  Component Value Date   CREATININE 1.20 11/28/2016   Lab Results  Component Value Date   PSA 0.05 07/14/2016   PSA 0.02 01/06/2016  PSA 0.04 06/23/2015   Lab Results  Component Value Date   AST 25 07/14/2016   Lab Results  Component Value Date   ALT 57  07/14/2016    Results for orders placed or performed in visit on 11/28/16  Microscopic Examination  Result Value Ref Range   WBC, UA None seen 0 - 5 /hpf   RBC, UA 0-2 0 - 2 /hpf   Epithelial Cells (non renal) None seen 0 - 10 /hpf   Bacteria, UA None seen None seen/Few  Basic metabolic panel  Result Value Ref Range   Glucose 117 (H) 65 - 99 mg/dL   BUN 27 8 - 27 mg/dL   Creatinine, Ser 1.20 0.76 - 1.27 mg/dL   GFR calc non Af Amer 58 (L) >59 mL/min/1.73   GFR calc Af Amer 67 >59 mL/min/1.73   BUN/Creatinine Ratio 23 10 - 24   Sodium 140 134 - 144 mmol/L   Potassium 5.5 (H) 3.5 - 5.2 mmol/L   Chloride 104 96 - 106 mmol/L   CO2 23 18 - 29 mmol/L   Calcium 9.6 8.6 - 10.2 mg/dL  CBC  Result Value Ref Range   WBC 4.7 3.4 - 10.8 x10E3/uL   RBC 4.22 4.14 - 5.80 x10E6/uL   Hemoglobin 12.1 (L) 13.0 - 17.7 g/dL   Hematocrit 35.5 (L) 37.5 - 51.0 %   MCV 84 79 - 97 fL   MCH 28.7 26.6 - 33.0 pg   MCHC 34.1 31.5 - 35.7 g/dL   RDW 13.9 12.3 - 15.4 %   Platelets 212 150 - 379 x10E3/uL  Urinalysis, Complete  Result Value Ref Range   Specific Gravity, UA 1.020 1.005 - 1.030   pH, UA 5.0 5.0 - 7.5   Color, UA Yellow Yellow   Appearance Ur Clear Clear   Leukocytes, UA Negative Negative   Protein, UA Negative Negative/Trace   Glucose, UA Negative Negative   Ketones, UA Negative Negative   RBC, UA Negative Negative   Bilirubin, UA Negative Negative   Urobilinogen, Ur 0.2 0.2 - 1.0 mg/dL   Nitrite, UA Negative Negative   Microscopic Examination See below:    I   Assessment & Plan:    1. Kidney stones:     - Patient has a history of uric acid stones.    - KUB notes tiny stones in the left kidney.    - We will reduce his Urocit-K dose to one tablet at night; script given  - He will be returning in 6 months for repeated labs (BMP, CBC and UA).    - Advised to contact our office or seek treatment in the ED if becomes febrile or pain/ vomiting are difficult control in order to  arrange for emergent/urgent intervention   2. Prostate cancer:     - Patient is now out 2 year having completed I-125 interstitial implant for a stage IIa (T2 N0 M0) Gleason 6 (3+3) adenocarcinoma prostate presenting the PSA 4.8.      - His last PSA in 06/2016  was 0.05     - IPS'S score is 0/0  - PSA  - He will be returning in 6 months for a PSA, IPSS score and exam.    3. Erectile dysfunction:   - SHIM score is 6  - He and his wife have decided that they can enjoy each other in other ways beside sex   Return in about 6 months (around 06/05/2017) for IPSS, PSA and exam.  These notes generated with voice recognition software. I apologize for typographical errors.  Zara Council, [facility] [address], [place], [place] [address] [phone]

## 2016-12-05 NOTE — Telephone Encounter (Signed)
Per [facility], labs were released and reported. We will cancel labs for Jan visit with Dr. Grayland Ormond since we do not need to repeat labs. Pt has been made aware.

## 2016-12-06 LAB — PSA: Prostate Specific Ag, Serum: <0.1 ng/mL (ref 0.0–4.0)

## 2017-01-16 ENCOUNTER — Other Ambulatory Visit: Payer: BLUE CROSS/BLUE SHIELD

## 2017-01-16 NOTE — Progress Notes (Signed)
[place]  [phone] [phone]  ID: Alex Lang OB: 11/18/1936  MR#: [id number]  [id number]  Patient Care Team: Madelyn Brunner, MD as PCP - General (Internal Medicine)  CHIEF COMPLAINT:  Prostate cancer, stage TIIa N0 M0 Gleason 4+3.  INTERVAL HISTORY: Patient returns to clinic today for routine six-month follow-up. He continues to feel well and remains asymptomatic. He has no neurologic complaints. He denies any recent fevers or illnesses. He has a good appetite and denies weight loss. He has no chest pain or shortness of breath. He denies any nausea, vomiting, constipation, or diarrhea. He has no urinary complaints. Patient feels at his baseline and offers no specific complaints today.  REVIEW OF SYSTEMS:   Review of Systems  Constitutional: Negative.  Negative for fever, malaise/fatigue and weight loss.  Respiratory: Negative.  Negative for shortness of breath.   Cardiovascular: Negative.  Negative for chest pain and leg swelling.  Gastrointestinal: Negative.  Negative for abdominal pain.  Genitourinary: Negative.  Negative for frequency and urgency.  Musculoskeletal: Negative.   Neurological: Negative.  Negative for sensory change and weakness.  Psychiatric/Behavioral: Negative.  The patient is not nervous/anxious.     As per HPI. Otherwise, a complete review of systems is negative.  PAST MEDICAL HISTORY: Past Medical History:  Diagnosis Date  . Acid reflux   . BP (high blood pressure) 10/02/2014  . BPH (benign prostatic hyperplasia)   . Elevated PSA   . Erectile dysfunction   . GERD (gastroesophageal reflux disease)   . Gross hematuria   . Hypertension   . Hypogonadism in male   . Kidney stone    bilateral  . Kidney stones 06/21/2015  . Microscopic hematuria   . Nephrolithiasis 2016  . Prostate cancer ([place])   . Renal stones 08/11/2015    PAST SURGICAL HISTORY: Past Surgical History:  Procedure Laterality  Date  . EXTRACORPOREAL SHOCK WAVE LITHOTRIPSY Right 07/23/2015   Procedure: EXTRACORPOREAL SHOCK WAVE LITHOTRIPSY (ESWL);  Surgeon: Hollice Espy, MD;  Location: [facility] ORS;  Service: Urology;  Laterality: Right;  . EXTRACORPOREAL SHOCK WAVE LITHOTRIPSY Left 09/03/2015   Procedure: EXTRACORPOREAL SHOCK WAVE LITHOTRIPSY (ESWL);  Surgeon: Nickie Retort, MD;  Location: [facility] ORS;  Service: Urology;  Laterality: Left;  . extracorporeal shockwave lithotripsy  2016  . KIDNEY STONE SURGERY    . laser lithotripsy and stent placement  2005  . ORTHOPEDIC SURGERY    . PROSTATE SURGERY     seeds    FAMILY HISTORY: Family History  Problem Relation Age of Onset  . Hypertension Mother   . Cancer Maternal Grandfather   . Cancer Brother   . Prostate cancer Brother   . Kidney disease Neg Hx   . Bladder Cancer Neg Hx        ADVANCED DIRECTIVES:    HEALTH MAINTENANCE: Social History  Substance Use Topics  . Smoking status: Never Smoker  . Smokeless tobacco: Never Used  . Alcohol use No     Colonoscopy:  PAP:  Bone density:  Lipid panel:  Allergies  Allergen Reactions  . Other Nausea And Vomiting    Darvocet  . Oxycodone Nausea And Vomiting  . Propoxyphene Nausea And Vomiting    Current Outpatient Prescriptions  Medication Sig Dispense Refill  . benazepril (LOTENSIN) 5 MG tablet Take by mouth.    . calcium carbonate (CALCIUM 600) 600 MG TABS tablet Take by mouth.    . Cholecalciferol (VITAMIN D3) 2000 UNITS capsule Take 2,000  Units by mouth daily.     Marland Kitchen Fish Oil-Cholecalciferol (FISH OIL + D3 PO) Take 1,200 mg by mouth daily.     . Ginkgo Biloba 40 MG TABS Take 120 mg by mouth daily.     Marland Kitchen ibuprofen (ADVIL,MOTRIN) 200 MG tablet Take 200 mg by mouth daily.    Marland Kitchen Lysine 500 MG CAPS Take 500 mg by mouth 2 (two) times daily.     Marland Kitchen omeprazole (PRILOSEC) 20 MG capsule Take by mouth.    . potassium citrate (UROCIT-K) 10 MEQ (1080 MG) SR tablet 1 tablet one time daily 90 tablet 3  .  tamsulosin (FLOMAX) 0.4 MG CAPS capsule TAKE ONE CAPSULE BY MOUTH DAILY 90 capsule 3  . benazepril (LOTENSIN) 5 MG tablet Take 5 mg by mouth 2 (two) times daily.     Marland Kitchen omeprazole (PRILOSEC) 20 MG capsule Take 20 mg by mouth 2 (two) times daily before a meal.      No current facility-administered medications for this visit.     OBJECTIVE: Vitals:   01/18/17 1105  BP: (!) 147/68  Pulse: 61  Resp: 18  Temp: 97.8 F (36.6 C)     Body mass index is 30.93 kg/m.    ECOG FS:0 - Asymptomatic  General: Well-developed, well-nourished, no acute distress. Eyes: Pink conjunctiva, anicteric sclera. HEENT: Normocephalic, moist mucous membranes, clear oropharnyx. Lungs: Clear to auscultation bilaterally. Heart: Regular rate and rhythm. No rubs, murmurs, or gallops. Abdomen: Soft, nontender, nondistended. No organomegaly noted, normoactive bowel sounds. Musculoskeletal: No edema, cyanosis, or clubbing. Neuro: Alert, answering all questions appropriately. Cranial nerves grossly intact. Skin: No rashes or petechiae noted. Psych: Normal affect. Lymphatics: No cervical, calvicular, axillary or inguinal LAD.   LAB RESULTS:  Lab Results  Component Value Date   NA 140 11/28/2016   K 5.5 (H) 11/28/2016   CL 104 11/28/2016   CO2 23 11/28/2016   GLUCOSE 117 (H) 11/28/2016   BUN 27 11/28/2016   CREATININE 1.20 11/28/2016   CALCIUM 9.6 11/28/2016   PROT 6.9 07/14/2016   ALBUMIN 4.1 07/14/2016   AST 25 07/14/2016   ALT 57 07/14/2016   ALKPHOS 91 07/14/2016   BILITOT 0.7 07/14/2016   GFRNONAA 58 (L) 11/28/2016   GFRAA 67 11/28/2016    Lab Results  Component Value Date   WBC 4.7 11/28/2016   NEUTROABS 1.9 07/14/2016   HGB 12.2 (L) 07/14/2016   HCT 35.5 (L) 11/28/2016   MCV 84 11/28/2016   PLT 212 11/28/2016   Lab Results  Component Value Date   PSA 0.05 07/14/2016     STUDIES: No results found.  ASSESSMENT: Prostate cancer, stage TIIa N0 M0 Gleason 4+3.  PLAN:    1. Prostate  cancer:  Patient received radioactive seed implants in January 2015. He also initiated androgen deprivation therapy approximately at the same time. He received his last dose of Lupron in March 2016. Patient's PSA continues to be less than 0.1. No intervention is needed at this time. Patient expressed understanding that if his PSA begins to increase, he may have to reinitiate Lupron. Return to clinic in 1 year for repeat laboratory work and further evaluation. Continue follow-up with urology as scheduled. 2. Leukopenia: Resolved. 3. Hypertension: Patient's blood pressure is mildly elevated today. Continue current medications. Treatment per primary care.  Patient expressed understanding and was in agreement with this plan. He also understands that He can call clinic at any time with any questions, concerns, or complaints.    Kathlene November  Grayland Ormond, MD   01/20/2017 5:42 PM

## 2017-01-18 ENCOUNTER — Inpatient Hospital Stay: Payer: Medicare Other | Attending: Oncology | Admitting: Oncology

## 2017-01-18 VITALS — BP 147/68 | HR 61 | Temp 97.8°F | Resp 18 | Wt 209.4 lb

## 2017-01-18 DIAGNOSIS — Z87442 Personal history of urinary calculi: Secondary | ICD-10-CM | POA: Insufficient documentation

## 2017-01-18 DIAGNOSIS — C61 Malignant neoplasm of prostate: Secondary | ICD-10-CM | POA: Diagnosis not present

## 2017-01-18 DIAGNOSIS — K219 Gastro-esophageal reflux disease without esophagitis: Secondary | ICD-10-CM | POA: Insufficient documentation

## 2017-01-18 DIAGNOSIS — R310 Gross hematuria: Secondary | ICD-10-CM | POA: Insufficient documentation

## 2017-01-18 DIAGNOSIS — N400 Enlarged prostate without lower urinary tract symptoms: Secondary | ICD-10-CM | POA: Diagnosis not present

## 2017-01-18 DIAGNOSIS — I10 Essential (primary) hypertension: Secondary | ICD-10-CM | POA: Diagnosis not present

## 2017-01-18 DIAGNOSIS — Z79899 Other long term (current) drug therapy: Secondary | ICD-10-CM | POA: Insufficient documentation

## 2017-01-18 NOTE — Progress Notes (Signed)
Patient is here for follow up he is doing ok

## 2017-01-23 ENCOUNTER — Telehealth: Payer: Self-pay | Admitting: Urology

## 2017-01-23 NOTE — Telephone Encounter (Signed)
LMOM

## 2017-01-23 NOTE — Telephone Encounter (Signed)
Patient did not have blood drawn in January by Dr. Grayland Ormond.  He will need a recheck on his potassium as it was elevated and we have reduced his Urocit-K.

## 2017-01-24 NOTE — Telephone Encounter (Signed)
LMOM

## 2017-01-25 ENCOUNTER — Telehealth: Payer: Self-pay

## 2017-01-25 DIAGNOSIS — N200 Calculus of kidney: Secondary | ICD-10-CM

## 2017-01-25 NOTE — Telephone Encounter (Signed)
Spoke with pt wife in reference needing potassium labs. Wife voiced understanding. Pt was added to lab schedule for tomorrow. Orders placed.

## 2017-01-25 NOTE — Telephone Encounter (Signed)
LMOM- will send a letter.  

## 2017-01-26 ENCOUNTER — Other Ambulatory Visit: Payer: Medicare Other

## 2017-01-26 DIAGNOSIS — N200 Calculus of kidney: Secondary | ICD-10-CM

## 2017-01-27 LAB — POTASSIUM: POTASSIUM: 5.5 mmol/L — AB (ref 3.5–5.2)

## 2017-01-30 ENCOUNTER — Telehealth: Payer: Self-pay

## 2017-01-30 NOTE — Telephone Encounter (Signed)
Spoke with pt wife in reference to drinking 10-12 cups of water daily. Wife voiced understanding.

## 2017-01-30 NOTE — Telephone Encounter (Signed)
Increased water consumption is actually the best thing to do to prevent kidney stones.  I'm happy if he can get in 10 to 12 cups of water daily.

## 2017-01-30 NOTE — Telephone Encounter (Signed)
Spoke with pt wife in reference to potassium and stopping urocit-k. Reinforced with wife pt should drink 10-12 cups of water, lemon aid, and orange juice. Wife stated that pt cant have lemon aid and orange juice due to canker sores that develop after consuming products. Wife requested other measures. Please advise.

## 2017-01-30 NOTE — Telephone Encounter (Signed)
-----   Message from Nori Riis, PA-C sent at 01/27/2017  8:35 AM EST ----- Please notify the patient that his potassium level is still slightly elevated at 5.5.  This is most likely the results of the benazepril in combination with the Urocit-K.  I suggest he discontinue the Urocit-K at this time.   Other means to prevent kidney stones, he should drink 10 to 12 cups of water daily and drink lemon aid and orange juice.

## 2017-03-17 ENCOUNTER — Encounter: Payer: Self-pay | Admitting: *Deleted

## 2017-03-23 ENCOUNTER — Other Ambulatory Visit: Payer: Self-pay | Admitting: Urology

## 2017-03-23 DIAGNOSIS — N200 Calculus of kidney: Secondary | ICD-10-CM

## 2017-03-26 ENCOUNTER — Telehealth: Payer: Self-pay | Admitting: Urology

## 2017-03-26 DIAGNOSIS — N200 Calculus of kidney: Secondary | ICD-10-CM

## 2017-03-26 NOTE — Telephone Encounter (Signed)
Patient's wife called the office and left a voice mail stating that the patient needs a refill on his tamsulosin.  Please contact the patient.

## 2017-03-27 MED ORDER — TAMSULOSIN HCL 0.4 MG PO CAPS: 0.4000 mg | ORAL_CAPSULE | Freq: Every day | ORAL | 2 refills | Status: DC

## 2017-03-27 NOTE — Telephone Encounter (Signed)
Medication sent to pharmacy  

## 2017-05-12 DIAGNOSIS — E039 Hypothyroidism, unspecified: Secondary | ICD-10-CM | POA: Insufficient documentation

## 2017-05-12 DIAGNOSIS — E038 Other specified hypothyroidism: Secondary | ICD-10-CM | POA: Insufficient documentation

## 2017-05-18 ENCOUNTER — Other Ambulatory Visit: Payer: Self-pay

## 2017-05-18 DIAGNOSIS — C61 Malignant neoplasm of prostate: Secondary | ICD-10-CM

## 2017-05-18 DIAGNOSIS — N200 Calculus of kidney: Secondary | ICD-10-CM

## 2017-06-02 ENCOUNTER — Other Ambulatory Visit: Payer: Medicare Other

## 2017-06-02 DIAGNOSIS — N200 Calculus of kidney: Secondary | ICD-10-CM

## 2017-06-02 DIAGNOSIS — C61 Malignant neoplasm of prostate: Secondary | ICD-10-CM

## 2017-06-02 LAB — URINALYSIS, COMPLETE
Bilirubin, UA: NEGATIVE
Glucose, UA: NEGATIVE
Ketones, UA: NEGATIVE
LEUKOCYTES UA: NEGATIVE
NITRITE UA: NEGATIVE
PH UA: 5.5 (ref 5.0–7.5)
Protein, UA: NEGATIVE
RBC UA: NEGATIVE
SPEC GRAV UA: 1.02 (ref 1.005–1.030)
UUROB: 0.2 mg/dL (ref 0.2–1.0)

## 2017-06-03 LAB — BASIC METABOLIC PANEL
BUN / CREAT RATIO: 21 (ref 10–24)
BUN: 23 mg/dL (ref 8–27)
CO2: 19 mmol/L — ABNORMAL LOW (ref 20–29)
CREATININE: 1.07 mg/dL (ref 0.76–1.27)
Calcium: 9.1 mg/dL (ref 8.6–10.2)
Chloride: 103 mmol/L (ref 96–106)
GFR, EST AFRICAN AMERICAN: 76 mL/min/{1.73_m2} (ref >59)
GFR, EST NON AFRICAN AMERICAN: 66 mL/min/{1.73_m2} (ref >59)
Glucose: 202 mg/dL — ABNORMAL HIGH (ref 65–99)
Potassium: 4.8 mmol/L (ref 3.5–5.2)
Sodium: 138 mmol/L (ref 134–144)

## 2017-06-03 LAB — CBC
HEMATOCRIT: 33.9 % — AB (ref 37.5–51.0)
Hemoglobin: 12 g/dL — ABNORMAL LOW (ref 13.0–17.7)
MCH: 30.4 pg (ref 26.6–33.0)
MCHC: 35.4 g/dL (ref 31.5–35.7)
MCV: 86 fL (ref 79–97)
Platelets: 214 10*3/uL (ref 150–379)
RBC: 3.95 x10E6/uL — ABNORMAL LOW (ref 4.14–5.80)
RDW: 14.4 % (ref 12.3–15.4)
WBC: 4.1 10*3/uL (ref 3.4–10.8)

## 2017-06-03 LAB — PSA: Prostate Specific Ag, Serum: <0.1 ng/mL (ref 0.0–4.0)

## 2017-06-04 NOTE — Progress Notes (Signed)
8:55 AM   Alex Lang Nov 19, 1936 [id number]  Referring provider: Madelyn Brunner, MD [facility] [address] [place], [place] [address]  Chief Complaint  Patient presents with  . Prostate Cancer    6 month follow up   . Erectile Dysfunction    HPI: Patient is a 81 year old Caucasian male with a history of prostate cancer, erectile dysfunction and nephrolithiasis who presents today for a 6 month follow up.    Prostate cancer Patient with cT2a Prostate cancer- Dx in 10/2014 with Gleason 3+4, 3+3 prostate cancer involving 8 of 13 cores, iPSA 4.8 DRE also somewhat concerning for cancer with questionable induration at right base. TRUS vol 45 cc.  He underwent I-125 brachytherapy seed placement on 01/26/2015 by Dr. Hollice Espy. He also received adjunctive ADT therapy for 4-6 months.  He is also being followed by the Cancer center and they will be seeing him yearly.  His last PSA in 06/02/2017 was <0.1 ng/mL.   His I PSS score today was 0/0. His previous IPSS score was 0/0.      IPSS    Row Name 06/05/17 0800         International Prostate Symptom Score   How often have you had the sensation of not emptying your bladder? Not at All     How often have you had to urinate less than every two hours? Not at All     How often have you found you stopped and started again several times when you urinated? Not at All     How often have you found it difficult to postpone urination? Not at All     How often have you had a weak urinary stream? Not at All     How often have you had to strain to start urination? Not at All     How many times did you typically get up at night to urinate? None     Total IPSS Score 0       Quality of Life due to urinary symptoms   If you were to spend the rest of your life with your urinary condition just the way it is now how would you feel about that? Delighted        Score:  1-7 Mild 8-19 Moderate 20-35 Severe  Erectile  dysfunction He and his wife are not sexually active at this time.  He is starting to have spontaneous erections intermittency.    Nephrolithiasis Patient has a history of uric acid stones. He was taking Urocit-K, but his potassium started to increase.  He has since discontinued the Urocit-K.  He has not had experienced any flank pain or gross hematuria.    KUB taken 11/28/2016 noted probable multiple tiny stones overlying the left kidney no definite right-sided kidney stones are observed but the study is quite limited due to overlying gas and stool.  Possible ileus or gastroenteritis bowel gas pattern.  His potassium from 06/02/2017 was 4.8.    PMH: Past Medical History:  Diagnosis Date  . Acid reflux   . BP (high blood pressure) 10/02/2014  . BPH (benign prostatic hyperplasia)   . Elevated PSA   . Erectile dysfunction   . GERD (gastroesophageal reflux disease)   . Gross hematuria   . Hypertension   . Hypogonadism in male   . Kidney stone    bilateral  . Kidney stones 06/21/2015  . Microscopic hematuria   . Nephrolithiasis 2016  .  Prostate cancer ([place])   . Renal stones 08/11/2015    Surgical History: Past Surgical History:  Procedure Laterality Date  . EXTRACORPOREAL SHOCK WAVE LITHOTRIPSY Right 07/23/2015   Procedure: EXTRACORPOREAL SHOCK WAVE LITHOTRIPSY (ESWL);  Surgeon: Hollice Espy, MD;  Location: [facility] ORS;  Service: Urology;  Laterality: Right;  . EXTRACORPOREAL SHOCK WAVE LITHOTRIPSY Left 09/03/2015   Procedure: EXTRACORPOREAL SHOCK WAVE LITHOTRIPSY (ESWL);  Surgeon: Nickie Retort, MD;  Location: [facility] ORS;  Service: Urology;  Laterality: Left;  . extracorporeal shockwave lithotripsy  2016  . KIDNEY STONE SURGERY    . laser lithotripsy and stent placement  2005  . ORTHOPEDIC SURGERY    . PROSTATE SURGERY     seeds    Home Medications:  Allergies as of 06/05/2017      Reactions   Other Nausea And Vomiting   Darvocet   Oxycodone Nausea And Vomiting   Propoxyphene  Nausea And Vomiting      Medication List       Accurate as of 06/05/17  8:55 AM. Always use your most recent med list.          benazepril 5 MG tablet Commonly known as:  LOTENSIN Take 5 mg by mouth 2 (two) times daily.   benazepril 5 MG tablet Commonly known as:  LOTENSIN Take by mouth.   CALCIUM 600 600 MG Tabs tablet Generic drug:  calcium carbonate Take by mouth.   FISH OIL + D3 PO Take 1,200 mg by mouth daily.   FISH OIL PO Take by mouth.   fluticasone 50 MCG/ACT nasal spray Commonly known as:  FLONASE Place into the nose.   gabapentin 300 MG capsule Commonly known as:  NEURONTIN Take by mouth.   Ginkgo Biloba 40 MG Tabs Take 120 mg by mouth daily.   ibuprofen 200 MG tablet Commonly known as:  ADVIL,MOTRIN Take 200 mg by mouth daily.   Lysine 500 MG Caps Take 500 mg by mouth 2 (two) times daily.   omeprazole 20 MG capsule Commonly known as:  PRILOSEC Take 20 mg by mouth 2 (two) times daily before a meal.   omeprazole 20 MG capsule Commonly known as:  PRILOSEC Take by mouth.   potassium citrate 10 MEQ (1080 MG) SR tablet Commonly known as:  UROCIT-K 1 tablet one time daily   tamsulosin 0.4 MG Caps capsule Commonly known as:  FLOMAX Take 1 capsule (0.4 mg total) by mouth daily.   Vitamin D3 2000 units capsule Take 2,000 Units by mouth daily.       Allergies:  Allergies  Allergen Reactions  . Other Nausea And Vomiting    Darvocet  . Oxycodone Nausea And Vomiting  . Propoxyphene Nausea And Vomiting    Family History: Family History  Problem Relation Age of Onset  . Hypertension Mother   . Cancer Maternal Grandfather   . Cancer Brother   . Prostate cancer Brother   . Kidney disease Neg Hx   . Bladder Cancer Neg Hx     Social History:  reports that he has never smoked. He has never used smokeless tobacco. He reports that he does not drink alcohol or use drugs.  ROS: UROLOGY Frequent Urination?: No Hard to postpone urination?:  No Burning/pain with urination?: No Get up at night to urinate?: No Leakage of urine?: No Urine stream starts and stops?: No Trouble starting stream?: No Do you have to strain to urinate?: No Blood in urine?: No Urinary tract infection?: No Sexually transmitted disease?: No  Injury to kidneys or bladder?: No Painful intercourse?: No Weak stream?: No Erection problems?: No Penile pain?: No  Gastrointestinal Nausea?: No Vomiting?: No Indigestion/heartburn?: No Diarrhea?: No Constipation?: No  Constitutional Fever: No Night sweats?: No Weight loss?: No Fatigue?: No  Skin Skin rash/lesions?: No Itching?: No  Eyes Blurred vision?: No Double vision?: No  Ears/Nose/Throat Sore throat?: No Sinus problems?: No  Hematologic/Lymphatic Swollen glands?: No Easy bruising?: No  Cardiovascular Leg swelling?: No Chest pain?: No  Respiratory Cough?: No Shortness of breath?: No  Endocrine Excessive thirst?: No  Musculoskeletal Back pain?: No Joint pain?: No  Neurological Headaches?: No Dizziness?: No  Psychologic Depression?: No Anxiety?: No  Physical Exam: BP (!) 178/67   Pulse (!) 55   Ht 5\' 9"  (1.753 m)   Wt 209 lb 3.2 oz (94.9 kg)   BMI 30.89 kg/m   Constitutional: Well nourished. Alert and oriented, No acute distress. HEENT: [place] AT, moist mucus membranes. Trachea midline, no masses. Cardiovascular: No clubbing, cyanosis, or edema. Respiratory: Normal respiratory effort, no increased work of breathing. GI: Abdomen is soft, non tender, non distended, no abdominal masses. Liver and spleen not palpable.  No hernias appreciated.  Stool sample for occult testing is not indicated.   GU: No CVA tenderness.  No bladder fullness or masses.  Patient with uncircumcised phallus. Foreskin easily retracted  Urethral meatus is patent.  No penile discharge. No penile lesions or rashes. Scrotum without lesions, cysts, rashes and/or edema.  Testicles are located  scrotally bilaterally. No masses are appreciated in the testicles. Left and right epididymis are normal. Rectal: Patient with  normal sphincter tone. Anus and perineum without scarring or rashes. No rectal masses are appreciated. Prostate is approximately 25 grams, no nodules are appreciated. Seminal vesicles are normal. Skin: No rashes, bruises or suspicious lesions. Lymph: No cervical or inguinal adenopathy. Neurologic: Grossly intact, no focal deficits, moving all 4 extremities. Psychiatric: Normal mood and affect.  Laboratory Data: Lab Results  Component Value Date   WBC 4.1 06/02/2017   HGB 12.0 (L) 06/02/2017   HCT 33.9 (L) 06/02/2017   MCV 86 06/02/2017   PLT 214 06/02/2017   Lab Results  Component Value Date   CREATININE 1.07 06/02/2017   PSA <0.1 on 06/02/2017 Lab Results  Component Value Date   PSA 0.05 07/14/2016   PSA 0.02 01/06/2016   PSA 0.04 06/23/2015   Lab Results  Component Value Date   AST 25 07/14/2016   Lab Results  Component Value Date   ALT 57 07/14/2016    Results for orders placed or performed in visit on 35/57/32  Basic metabolic panel  Result Value Ref Range   Glucose 202 (H) 65 - 99 mg/dL   BUN 23 8 - 27 mg/dL   Creatinine, Ser 1.07 0.76 - 1.27 mg/dL   GFR calc non Af Amer 66 >59 mL/min/1.73   GFR calc Af Amer 76 >59 mL/min/1.73   BUN/Creatinine Ratio 21 10 - 24   Sodium 138 134 - 144 mmol/L   Potassium 4.8 3.5 - 5.2 mmol/L   Chloride 103 96 - 106 mmol/L   CO2 19 (L) 20 - 29 mmol/L   Calcium 9.1 8.6 - 10.2 mg/dL  CBC  Result Value Ref Range   WBC 4.1 3.4 - 10.8 x10E3/uL   RBC 3.95 (L) 4.14 - 5.80 x10E6/uL   Hemoglobin 12.0 (L) 13.0 - 17.7 g/dL   Hematocrit 33.9 (L) 37.5 - 51.0 %   MCV 86 79 - 97 fL  MCH 30.4 26.6 - 33.0 pg   MCHC 35.4 31.5 - 35.7 g/dL   RDW 14.4 12.3 - 15.4 %   Platelets 214 150 - 379 x10E3/uL  PSA  Result Value Ref Range   Prostate Specific Ag, Serum <0.1 0.0 - 4.0 ng/mL  Urinalysis, Complete  Result Value  Ref Range   Specific Gravity, UA 1.020 1.005 - 1.030   pH, UA 5.5 5.0 - 7.5   Color, UA Yellow Yellow   Appearance Ur Clear Clear   Leukocytes, UA Negative Negative   Protein, UA Negative Negative/Trace   Glucose, UA Negative Negative   Ketones, UA Negative Negative   RBC, UA Negative Negative   Bilirubin, UA Negative Negative   Urobilinogen, Ur 0.2 0.2 - 1.0 mg/dL   Nitrite, UA Negative Negative   I   Assessment & Plan:    1. Kidney stones:     - Patient has a history of uric acid stones.    - KUB notes tiny stones in the left kidney in December   - Patient needed to discontinue the Urocit-K due to elevated potassium levels  - Advised to contact our office or seek treatment in the ED if becomes febrile or pain/ vomiting are difficult control in order to arrange for emergent/urgent intervention   2. Prostate cancer:     - Patient is now out 3 year having completed I-125 interstitial implant for a stage IIa (T2 N0 M0) Gleason 6 (3+3) adenocarcinoma prostate presenting the PSA 4.8.      - His last PSA in 05/2017  was <0.1    - I PSS score is 0/0  - He will be returning in 6 months for a PSA, IPSS score and exam.    3. Erectile dysfunction:   - He and his wife have decided that they can enjoy each other in other ways beside sex   Return in about 6 months (around 12/05/2017) for IPSS, PSA and exam.  These notes generated with voice recognition software. I apologize for typographical errors.  Zara Council, [facility] [address] [place], [place] [address] [phone]

## 2017-06-05 ENCOUNTER — Encounter: Payer: Self-pay | Admitting: Urology

## 2017-06-05 ENCOUNTER — Ambulatory Visit (INDEPENDENT_AMBULATORY_CARE_PROVIDER_SITE_OTHER): Payer: Medicare Other | Admitting: Urology

## 2017-06-05 VITALS — BP 178/67 | HR 55 | Ht 69.0 in | Wt 209.2 lb

## 2017-06-05 DIAGNOSIS — N200 Calculus of kidney: Secondary | ICD-10-CM

## 2017-06-05 DIAGNOSIS — C61 Malignant neoplasm of prostate: Secondary | ICD-10-CM | POA: Diagnosis not present

## 2017-06-05 DIAGNOSIS — N529 Male erectile dysfunction, unspecified: Secondary | ICD-10-CM | POA: Diagnosis not present

## 2017-07-12 ENCOUNTER — Inpatient Hospital Stay: Payer: BLUE CROSS/BLUE SHIELD

## 2017-07-18 ENCOUNTER — Inpatient Hospital Stay: Payer: Medicare Other | Attending: Oncology

## 2017-07-18 ENCOUNTER — Other Ambulatory Visit: Payer: Self-pay

## 2017-07-18 DIAGNOSIS — C61 Malignant neoplasm of prostate: Secondary | ICD-10-CM | POA: Diagnosis not present

## 2017-07-18 LAB — PSA: Prostatic Specific Antigen: 0.11 ng/mL (ref 0.00–4.00)

## 2017-07-19 ENCOUNTER — Ambulatory Visit: Payer: BLUE CROSS/BLUE SHIELD | Admitting: Radiation Oncology

## 2017-07-26 ENCOUNTER — Encounter: Payer: Self-pay | Admitting: Radiation Oncology

## 2017-07-26 ENCOUNTER — Ambulatory Visit
Admission: RE | Admit: 2017-07-26 | Discharge: 2017-07-26 | Disposition: A | Discharge status: Home / Self Care | Payer: Medicare Other | Source: Ambulatory Visit | Attending: Radiation Oncology | Admitting: Radiation Oncology

## 2017-07-26 VITALS — BP 146/62 | HR 56 | Temp 96.6°F | Wt 208.7 lb

## 2017-07-26 DIAGNOSIS — C61 Malignant neoplasm of prostate: Secondary | ICD-10-CM | POA: Insufficient documentation

## 2017-07-26 DIAGNOSIS — Z923 Personal history of irradiation: Secondary | ICD-10-CM | POA: Insufficient documentation

## 2017-07-26 NOTE — Progress Notes (Signed)
Radiation Oncology Follow up Note  Name: Alex Lang   Date:   07/26/2017 MRN:  [id number] DOB: 04-Aug-1936    This 81 y.o. male presents to the clinic today for to have your follow-up status post I-125 interstitial implant for.stage IIa adenocarcinoma the prostate  REFERRING PROVIDER: Madelyn Brunner, MD  HPI: patient is a 81 year old male now seen out 2-1/2 years having completed I-125 interstitial implant for a Gleason 6 (3+3) adenocarcinoma the prostate presenting the PSA of 4.8. Seen today in routine follow-up is doing well he is asymptomatic specifically denies any exacerbation of lower urinary tract symptoms diarrhea. His most recent PSA performed last month was 0.11.Marland Kitchen  COMPLICATIONS OF TREATMENT: none  FOLLOW UP COMPLIANCE: keeps appointments   PHYSICAL EXAM:  BP (!) 146/62   Pulse (!) 56   Temp (!) 96.6 F (35.9 C)   Wt 208 lb 10.7 oz (94.7 kg)   BMI 30.81 kg/m  On rectal exam rectal sphincter tone is good. Prostate is smooth contracted without evidence of nodularity or mass. Sulcus is preserved bilaterally. No discrete nodularity is identified. No other rectal abnormalities are noted. Well-developed well-nourished patient in NAD. HEENT reveals PERLA, EOMI, discs not visualized.  Oral cavity is clear. No oral mucosal lesions are identified. Neck is clear without evidence of cervical or supraclavicular adenopathy. Lungs are clear to A&P. Cardiac examination is essentially unremarkable with regular rate and rhythm without murmur rub or thrill. Abdomen is benign with no organomegaly or masses noted. Motor sensory and DTR levels are equal and symmetric in the upper and lower extremities. Cranial nerves II through XII are grossly intact. Proprioception is intact. No peripheral adenopathy or edema is identified. No motor or sensory levels are noted. Crude visual fields are within normal range.  RADIOLOGY RESULTS: no current films for review  PLAN: present time patient is  doing well under good biochemical control of his prostate cancer. I'm please was progress. I've asked to see him back in 1 year for follow-up and will obtain a PSA prior to that visit. Patient knows to call sooner with any concerns.  I would like to take this opportunity to thank you for allowing me to participate in the care of your patient.Armstead Peaks., MD

## 2017-11-19 IMAGING — CR DG ABDOMEN 1V
1 series · 2 of 2 positions shown · non-contrast
Comparison: KUB dated September 17, 2015

CLINICAL DATA: Recheck of kidney stones. No current complaints.
History of prostatic malignancy with seed implants.

EXAM:
ABDOMEN - 1 VIEW

[Series 1: dg abd 1 view · 0.14mm/px · 2 of 2 slices shown]
[im 1/2]
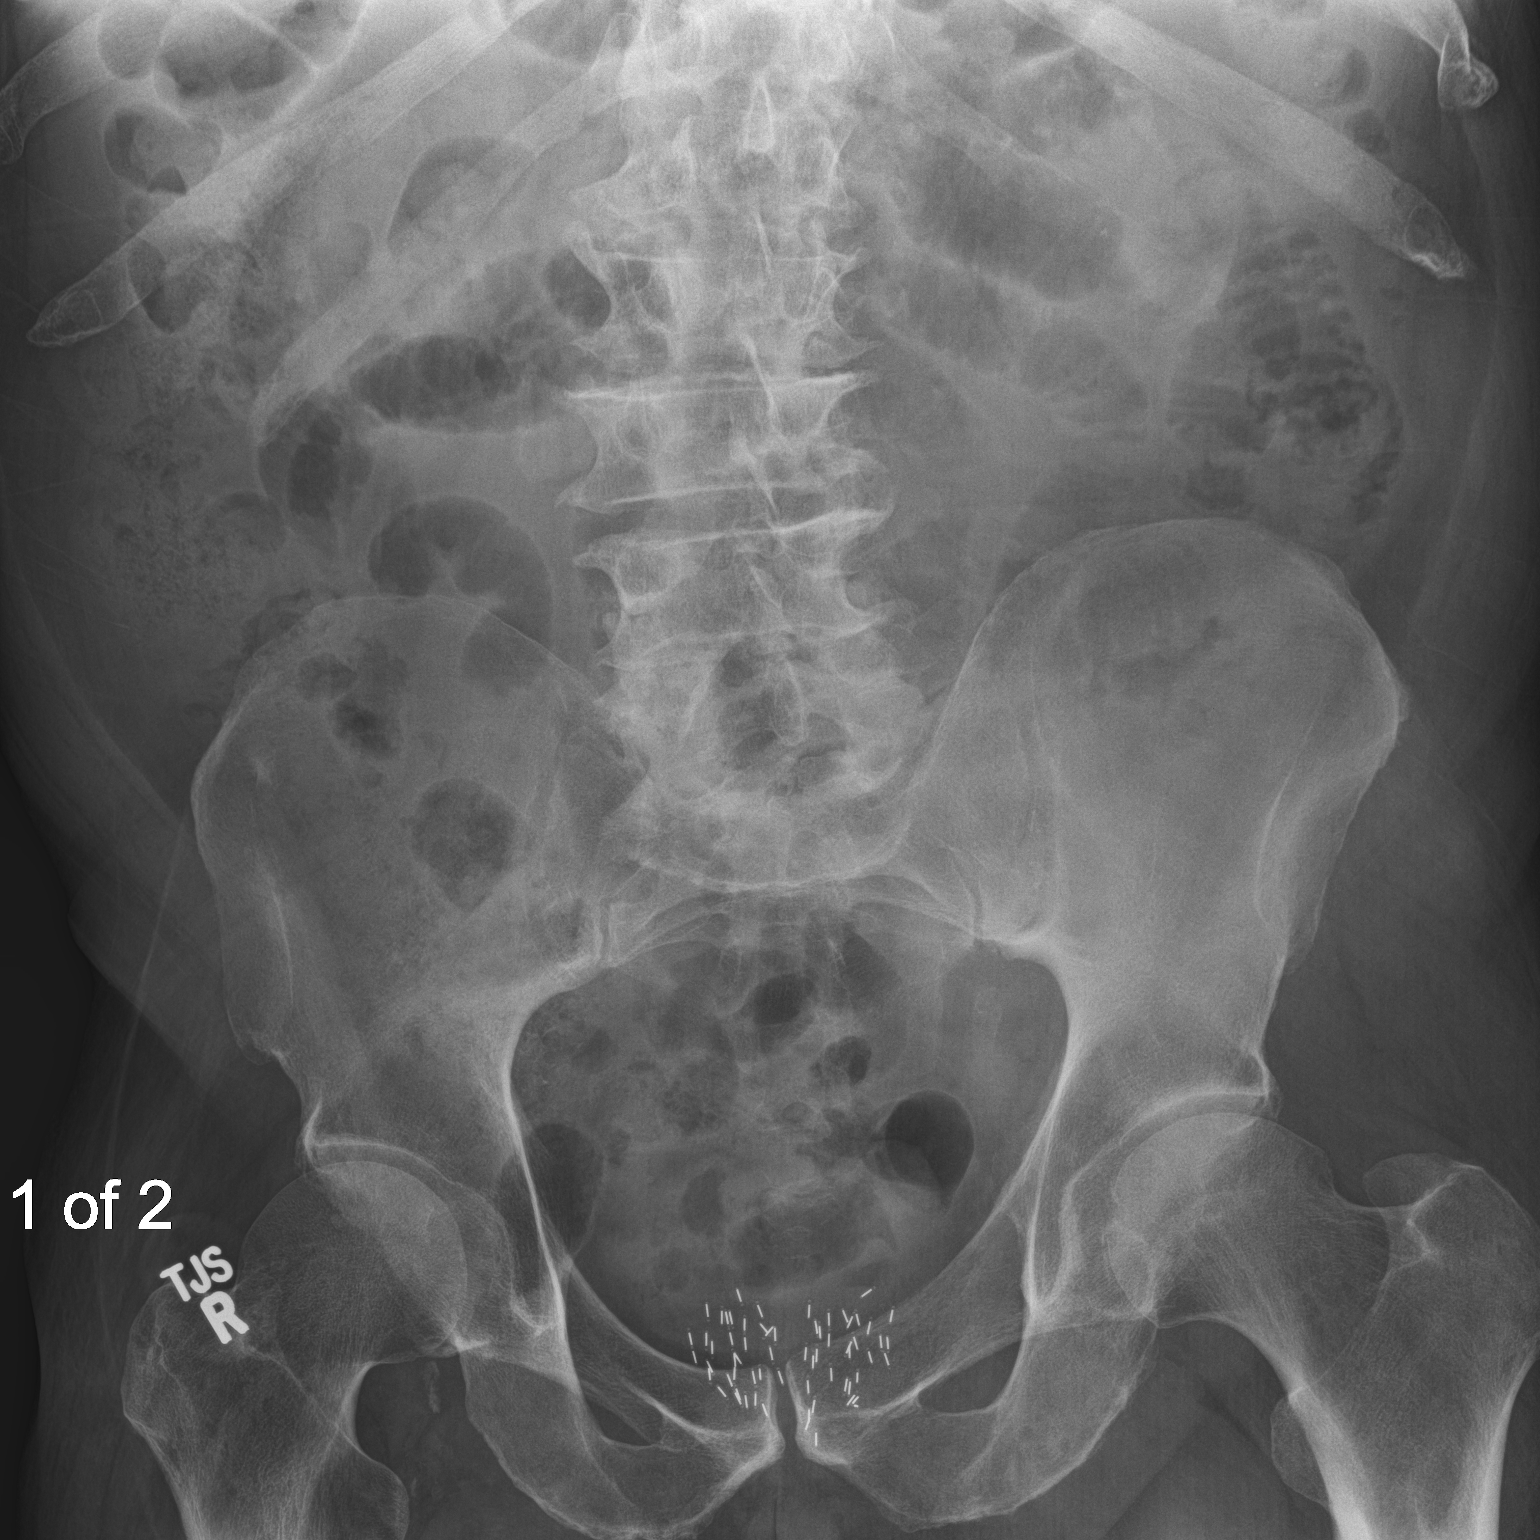
[im 2/2]
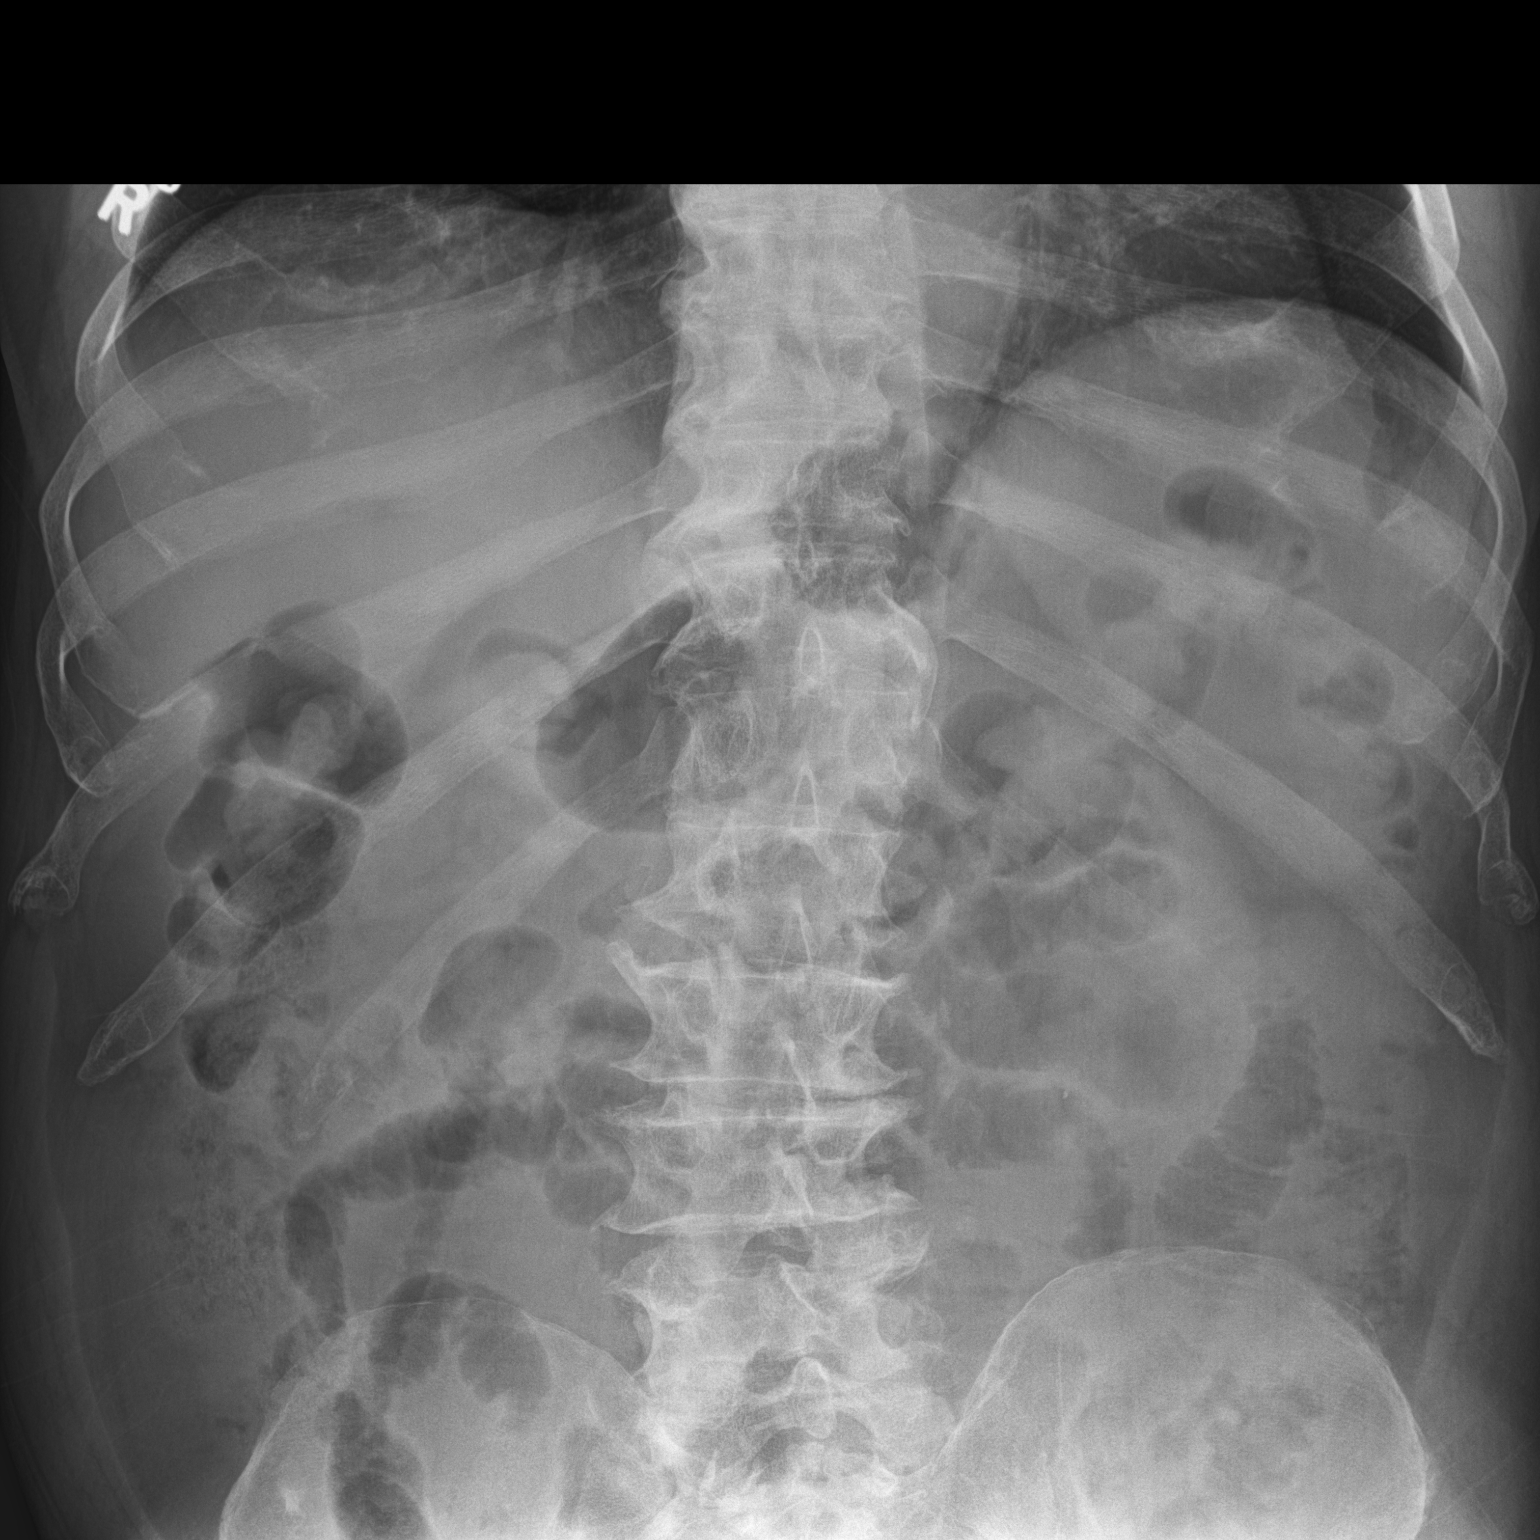

[2 of 2 positions shown; findings below may reference images not displayed]

FINDINGS: Faint radiodensities project over the left kidney. There is
considerable overlying stool and gas which limits the study. These
radiodensities do not appear to be larger than 5 mm in diameter. The
bowel gas pattern is somewhat nonspecific with mildly increased
volume of gas within the small bowel loops. There is stool and gas
in the colon with no obstructive pattern.
IMPRESSION: Probable multiple tiny stones overlying the left kidney no definite
right-sided kidney stones are observed but the study is quite
limited due to overlying gas and stool.

Possible ileus or gastroenteritis bowel gas pattern.

## 2017-12-06 ENCOUNTER — Other Ambulatory Visit: Payer: Medicare Other

## 2017-12-07 ENCOUNTER — Ambulatory Visit: Payer: Medicare Other | Admitting: Urology

## 2017-12-25 ENCOUNTER — Other Ambulatory Visit: Payer: Self-pay

## 2017-12-25 DIAGNOSIS — C61 Malignant neoplasm of prostate: Secondary | ICD-10-CM

## 2017-12-26 ENCOUNTER — Other Ambulatory Visit: Payer: Medicare Other

## 2017-12-26 DIAGNOSIS — C61 Malignant neoplasm of prostate: Secondary | ICD-10-CM

## 2017-12-27 LAB — PSA

## 2017-12-27 NOTE — Progress Notes (Signed)
8:51 PM   Alex Lang 1936-02-03 [id number]  Referring provider: Madelyn Brunner, MD No address on file  Chief Complaint  Patient presents with  . Benign Prostatic Hypertrophy  . Nephrolithiasis    HPI: Patient is a 82 year old Caucasian male with a history of prostate cancer, erectile dysfunction and nephrolithiasis who presents today for a 6 month follow up.    Prostate cancer Patient with cT2a Prostate cancer- Dx in 10/2014 with Gleason 3+4, 3+3 prostate cancer involving 8 of 13 cores, iPSA 4.8 DRE also somewhat concerning for cancer with questionable induration at right base. TRUS vol 45 cc.  He underwent I-125 brachytherapy seed placement on 01/26/2015 by Dr. Hollice Espy. He also received adjunctive ADT therapy for 4-6 months.  He is also being followed by the Cancer center and they will be seeing him yearly.  His last PSA in 12/26/2017 was <0.1 ng/mL.   His I PSS score today was 0/0. His previous IPSS score was 0/0.  IPSS    Row Name 12/28/17 0800         International Prostate Symptom Score   How often have you had the sensation of not emptying your bladder?  Not at All     How often have you had to urinate less than every two hours?  Not at All     How often have you found you stopped and started again several times when you urinated?  Not at All     How often have you found it difficult to postpone urination?  Not at All     How often have you had a weak urinary stream?  Not at All     How often have you had to strain to start urination?  Not at All     How many times did you typically get up at night to urinate?  None     Total IPSS Score  0       Quality of Life due to urinary symptoms   If you were to spend the rest of your life with your urinary condition just the way it is now how would you feel about that?  Delighted        Score:  1-7 Mild 8-19 Moderate 20-35 Severe  Erectile dysfunction He and his wife are not sexually active at  this time.  He is starting to have spontaneous erections intermittency.    Nephrolithiasis Patient has a history of uric acid stones. He was taking Urocit-K, but his potassium started to increase.  He has since discontinued the Urocit-K.  He has not had experienced any flank pain or gross hematuria.    KUB taken 11/28/2016 noted probable multiple tiny stones overlying the left kidney no definite right-sided kidney stones are observed but the study is quite limited due to overlying gas and stool.  Possible ileus or gastroenteritis bowel gas pattern.  His potassium from 06/02/2017 was 4.8.   He had the sudden onset of right sided flank pain that started last evening.  He feels the pain is similar to the stone pain he has had in the past.  He has not had gross hematuria, suprapubic pain or dysuria.  He had not had fevers, chills, nausea or vomiting.    PMH: Past Medical History:  Diagnosis Date  . Acid reflux   . BP (high blood pressure) 10/02/2014  . BPH (benign prostatic hyperplasia)   . Elevated PSA   . Erectile dysfunction   .  GERD (gastroesophageal reflux disease)   . Gross hematuria   . Hypertension   . Hypogonadism in male   . Kidney stone    bilateral  . Kidney stones 06/21/2015  . Microscopic hematuria   . Nephrolithiasis 2016  . Prostate cancer ([place])   . Renal stones 08/11/2015    Surgical History: Past Surgical History:  Procedure Laterality Date  . EXTRACORPOREAL SHOCK WAVE LITHOTRIPSY Right 07/23/2015   Procedure: EXTRACORPOREAL SHOCK WAVE LITHOTRIPSY (ESWL);  Surgeon: Hollice Espy, MD;  Location: [facility] ORS;  Service: Urology;  Laterality: Right;  . EXTRACORPOREAL SHOCK WAVE LITHOTRIPSY Left 09/03/2015   Procedure: EXTRACORPOREAL SHOCK WAVE LITHOTRIPSY (ESWL);  Surgeon: Nickie Retort, MD;  Location: [facility] ORS;  Service: Urology;  Laterality: Left;  . extracorporeal shockwave lithotripsy  2016  . KIDNEY STONE SURGERY    . laser lithotripsy and stent placement  2005  .  ORTHOPEDIC SURGERY    . PROSTATE SURGERY     seeds    Home Medications:  Allergies as of 12/28/2017      Reactions   Other Nausea And Vomiting   Darvocet   Oxycodone Nausea And Vomiting   Propoxyphene Nausea And Vomiting      Medication List        Accurate as of 12/28/17 11:59 PM. Always use your most recent med list.          benazepril 5 MG tablet Commonly known as:  LOTENSIN Take 5 mg by mouth 2 (two) times daily.   benazepril 5 MG tablet Commonly known as:  LOTENSIN Take by mouth.   CALCIUM 600 600 MG Tabs tablet Generic drug:  calcium carbonate Take by mouth.   FISH OIL + D3 PO Take 1,200 mg by mouth daily.   gabapentin 300 MG capsule Commonly known as:  NEURONTIN Take by mouth.   Ginkgo Biloba 40 MG Tabs Take 120 mg by mouth daily.   ibuprofen 200 MG tablet Commonly known as:  ADVIL,MOTRIN Take 200 mg by mouth daily.   Lysine 500 MG Caps Take 500 mg by mouth 2 (two) times daily.   omeprazole 20 MG capsule Commonly known as:  PRILOSEC Take 20 mg by mouth 2 (two) times daily before a meal.   omeprazole 20 MG capsule Commonly known as:  PRILOSEC Take by mouth.   potassium citrate 10 MEQ (1080 MG) SR tablet Commonly known as:  UROCIT-K 1 tablet one time daily   tamsulosin 0.4 MG Caps capsule Commonly known as:  FLOMAX Take 1 capsule (0.4 mg total) by mouth daily.   Vitamin D3 2000 units capsule Take 2,000 Units by mouth daily.       Allergies:  Allergies  Allergen Reactions  . Other Nausea And Vomiting    Darvocet  . Oxycodone Nausea And Vomiting  . Propoxyphene Nausea And Vomiting    Family History: Family History  Problem Relation Age of Onset  . Hypertension Mother   . Cancer Maternal Grandfather   . Cancer Brother   . Prostate cancer Brother   . Kidney disease Neg Hx   . Bladder Cancer Neg Hx     Social History:  reports that  has never smoked. he has never used smokeless tobacco. He reports that he does not drink  alcohol or use drugs.  ROS: UROLOGY Frequent Urination?: No Hard to postpone urination?: No Burning/pain with urination?: No Get up at night to urinate?: No Leakage of urine?: No Urine stream starts and stops?: No Trouble starting stream?: No  Do you have to strain to urinate?: No Blood in urine?: No Urinary tract infection?: No Sexually transmitted disease?: No Injury to kidneys or bladder?: No Painful intercourse?: No Weak stream?: No Erection problems?: No Penile pain?: No  Gastrointestinal Nausea?: No Vomiting?: No Indigestion/heartburn?: No Diarrhea?: No Constipation?: No  Constitutional Fever: No Night sweats?: No Weight loss?: No Fatigue?: No  Skin Skin rash/lesions?: No Itching?: No  Eyes Blurred vision?: No Double vision?: No  Ears/Nose/Throat Sore throat?: No Sinus problems?: No  Hematologic/Lymphatic Swollen glands?: No Easy bruising?: No  Cardiovascular Leg swelling?: No Chest pain?: No  Respiratory Cough?: No Shortness of breath?: No  Endocrine Excessive thirst?: No  Musculoskeletal Back pain?: Yes Joint pain?: No  Neurological Headaches?: No Dizziness?: No  Psychologic Depression?: No Anxiety?: No  Physical Exam: BP (!) 153/74   Pulse (!) 59   Ht 5\' 8"  (1.727 m)   Wt 206 lb (93.4 kg)   BMI 31.32 kg/m   Constitutional: Well nourished. Alert and oriented, No acute distress. HEENT: [place] AT, moist mucus membranes. Trachea midline, no masses. Cardiovascular: No clubbing, cyanosis, or edema. Respiratory: Normal respiratory effort, no increased work of breathing. GI: Abdomen is soft, non tender, non distended, no abdominal masses. Liver and spleen not palpable.  No hernias appreciated.  Stool sample for occult testing is not indicated.   GU: No CVA tenderness.  No bladder fullness or masses.  Patient with uncircumcised phallus.  Foreskin easily retracted.   Urethral meatus is patent.  No penile discharge. No penile lesions or  rashes. Scrotum without lesions, cysts, rashes and/or edema.  Testicles are located scrotally bilaterally. No masses are appreciated in the testicles. Left and right epididymis are normal. Rectal: Patient with  normal sphincter tone. Anus and perineum without scarring or rashes. No rectal masses are appreciated. Prostate is approximately 25 grams, no nodules are appreciated. Seminal vesicles are normal. Skin: No rashes, bruises or suspicious lesions. Lymph: No cervical or inguinal adenopathy. Neurologic: Grossly intact, no focal deficits, moving all 4 extremities. Psychiatric: Normal mood and affect.   Laboratory Data: Lab Results  Component Value Date   WBC 4.2 12/28/2017   HGB 12.3 (L) 12/28/2017   HCT 37.2 (L) 12/28/2017   MCV 89 12/28/2017   PLT 207 12/28/2017   Lab Results  Component Value Date   CREATININE 1.20 12/28/2017  PSA <0.1 on 12/26/2017 PSA <0.1 on 06/02/2017 Lab Results  Component Value Date   PSA 0.05 07/14/2016   PSA 0.02 01/06/2016   PSA 0.04 06/23/2015    Results for orders placed or performed in visit on 12/28/17  CULTURE, URINE COMPREHENSIVE  Result Value Ref Range   Urine Culture, Comprehensive Final report (A)    Organism ID, Bacteria Comment (A)    Organism ID, Bacteria Lactobacillus species    ANTIMICROBIAL SUSCEPTIBILITY Comment   Microscopic Examination  Result Value Ref Range   WBC, UA None seen 0 - 5 /hpf   RBC, UA 0-2 0 - 2 /hpf   Epithelial Cells (non renal) None seen 0 - 10 /hpf   Casts Present (A) None seen /lpf   Cast Type Hyaline casts N/A   Bacteria, UA None seen None seen/Few  Urinalysis, Complete  Result Value Ref Range   Specific Gravity, UA 1.015 1.005 - 1.030   pH, UA 5.0 5.0 - 7.5   Color, UA Yellow Yellow   Appearance Ur Clear Clear   Leukocytes, UA Negative Negative   Protein, UA Negative Negative/Trace   Glucose, UA  Negative Negative   Ketones, UA Negative Negative   RBC, UA Trace (A) Negative   Bilirubin, UA  Negative Negative   Urobilinogen, Ur 0.2 0.2 - 1.0 mg/dL   Nitrite, UA Negative Negative   Microscopic Examination See below:   Basic metabolic panel  Result Value Ref Range   Glucose 149 (H) 65 - 99 mg/dL   BUN 28 (H) 8 - 27 mg/dL   Creatinine, Ser 1.20 0.76 - 1.27 mg/dL   GFR calc non Af Amer 57 (L) >59 mL/min/1.73   GFR calc Af Amer 66 >59 mL/min/1.73   BUN/Creatinine Ratio 23 10 - 24   Sodium 137 134 - 144 mmol/L   Potassium 5.8 (H) 3.5 - 5.2 mmol/L   Chloride 103 96 - 106 mmol/L   CO2 23 20 - 29 mmol/L   Calcium 9.5 8.6 - 10.2 mg/dL  CBC  Result Value Ref Range   WBC 4.2 3.4 - 10.8 x10E3/uL   RBC 4.19 4.14 - 5.80 x10E6/uL   Hemoglobin 12.3 (L) 13.0 - 17.7 g/dL   Hematocrit 37.2 (L) 37.5 - 51.0 %   MCV 89 79 - 97 fL   MCH 29.4 26.6 - 33.0 pg   MCHC 33.1 31.5 - 35.7 g/dL   RDW 13.8 12.3 - 15.4 %   Platelets 207 150 - 379 x10E3/uL   I have reviewed the labs.   Assessment & Plan:    1. Prostate cancer:     - Patient is now out 3 year having completed I-125 interstitial implant for a stage IIa (T2 N0 M0) Gleason 6 (3+3) adenocarcinoma prostate presenting the PSA 4.8.      - Current PSA is <0.1    - I PSS score is 0/0  - He will be returning in 6 months for a PSA, IPSS score and exam.   2. Kidney stones:     - Patient has a history of uric acid stones.    - KUB notes tiny stones in the left kidney in December 2017  - Patient needed to discontinue the Urocit-K due to elevated potassium levels  - Advised to contact our office or seek treatment in the ED if becomes febrile or pain/ vomiting are difficult control in order to arrange for emergent/urgent intervention  3. Right flank pain  - obtain a CT Renal stone study  - start taking one Urocit- K tablet daily  - Advised to contact our office or seek treatment in the ED if becomes febrile or pain/ vomiting are difficult control in order to arrange for emergent/urgent intervention    Return for CT Report .  These notes  generated with voice recognition software. I apologize for typographical errors.  Zara Council, [facility] [address], [place], [place] [address] [phone]

## 2017-12-28 ENCOUNTER — Encounter: Payer: Self-pay | Admitting: Urology

## 2017-12-28 ENCOUNTER — Ambulatory Visit (INDEPENDENT_AMBULATORY_CARE_PROVIDER_SITE_OTHER): Payer: Medicare Other | Admitting: Urology

## 2017-12-28 VITALS — BP 153/74 | HR 59 | Ht 68.0 in | Wt 206.0 lb

## 2017-12-28 DIAGNOSIS — N200 Calculus of kidney: Secondary | ICD-10-CM | POA: Diagnosis not present

## 2017-12-28 DIAGNOSIS — C61 Malignant neoplasm of prostate: Secondary | ICD-10-CM

## 2017-12-28 DIAGNOSIS — R109 Unspecified abdominal pain: Secondary | ICD-10-CM | POA: Diagnosis not present

## 2017-12-28 LAB — MICROSCOPIC EXAMINATION
BACTERIA UA: NONE SEEN
Epithelial Cells (non renal): NONE SEEN /hpf (ref 0–10)
WBC UA: NONE SEEN /HPF (ref 0–?)

## 2017-12-28 LAB — URINALYSIS, COMPLETE
BILIRUBIN UA: NEGATIVE
Glucose, UA: NEGATIVE
KETONES UA: NEGATIVE
Leukocytes, UA: NEGATIVE
Nitrite, UA: NEGATIVE
PH UA: 5 (ref 5.0–7.5)
PROTEIN UA: NEGATIVE
SPEC GRAV UA: 1.015 (ref 1.005–1.030)
UUROB: 0.2 mg/dL (ref 0.2–1.0)

## 2017-12-29 ENCOUNTER — Telehealth: Payer: Self-pay

## 2017-12-29 LAB — BASIC METABOLIC PANEL
BUN/Creatinine Ratio: 23 (ref 10–24)
BUN: 28 mg/dL — ABNORMAL HIGH (ref 8–27)
CO2: 23 mmol/L (ref 20–29)
Calcium: 9.5 mg/dL (ref 8.6–10.2)
Chloride: 103 mmol/L (ref 96–106)
Creatinine, Ser: 1.2 mg/dL (ref 0.76–1.27)
GFR calc Af Amer: 66 mL/min/{1.73_m2} (ref >59)
GFR calc non Af Amer: 57 mL/min/{1.73_m2} — ABNORMAL LOW (ref >59)
GLUCOSE: 149 mg/dL — AB (ref 65–99)
POTASSIUM: 5.8 mmol/L — AB (ref 3.5–5.2)
SODIUM: 137 mmol/L (ref 134–144)

## 2017-12-29 LAB — CBC
Hematocrit: 37.2 % — ABNORMAL LOW (ref 37.5–51.0)
Hemoglobin: 12.3 g/dL — ABNORMAL LOW (ref 13.0–17.7)
MCH: 29.4 pg (ref 26.6–33.0)
MCHC: 33.1 g/dL (ref 31.5–35.7)
MCV: 89 fL (ref 79–97)
PLATELETS: 207 10*3/uL (ref 150–379)
RBC: 4.19 x10E6/uL (ref 4.14–5.80)
RDW: 13.8 % (ref 12.3–15.4)
WBC: 4.2 10*3/uL (ref 3.4–10.8)

## 2017-12-29 NOTE — Telephone Encounter (Signed)
-----   Message from Alex Riis, PA-C sent at 12/29/2017  7:46 AM EST ----- Please let [name] that his potassium is elevated.   He needs to not take the Urocit K at this time.  Have they scheduled his CT scan yet?  I would like to have it done on Monday or Tuesday if possible.

## 2017-12-29 NOTE — Telephone Encounter (Signed)
Patient and wife notified.  

## 2018-01-05 LAB — CULTURE, URINE COMPREHENSIVE

## 2018-01-08 ENCOUNTER — Ambulatory Visit
Admission: RE | Admit: 2018-01-08 | Discharge: 2018-01-08 | Disposition: A | Discharge status: Home / Self Care | Payer: Medicare Other | Source: Ambulatory Visit | Attending: Urology | Admitting: Urology

## 2018-01-08 DIAGNOSIS — R109 Unspecified abdominal pain: Secondary | ICD-10-CM | POA: Diagnosis not present

## 2018-01-08 DIAGNOSIS — N200 Calculus of kidney: Secondary | ICD-10-CM | POA: Insufficient documentation

## 2018-01-08 DIAGNOSIS — K4090 Unilateral inguinal hernia, without obstruction or gangrene, not specified as recurrent: Secondary | ICD-10-CM | POA: Diagnosis not present

## 2018-01-11 ENCOUNTER — Encounter: Payer: Self-pay | Admitting: Urology

## 2018-01-11 ENCOUNTER — Inpatient Hospital Stay: Payer: Medicare Other | Attending: Oncology

## 2018-01-11 ENCOUNTER — Ambulatory Visit (INDEPENDENT_AMBULATORY_CARE_PROVIDER_SITE_OTHER): Payer: Medicare Other | Admitting: Urology

## 2018-01-11 ENCOUNTER — Ambulatory Visit: Payer: Medicare Other | Admitting: Oncology

## 2018-01-11 ENCOUNTER — Telehealth: Payer: Self-pay | Admitting: Urology

## 2018-01-11 VITALS — BP 152/53 | HR 65 | Ht 68.0 in | Wt 200.0 lb

## 2018-01-11 DIAGNOSIS — N200 Calculus of kidney: Secondary | ICD-10-CM | POA: Diagnosis not present

## 2018-01-11 DIAGNOSIS — E875 Hyperkalemia: Secondary | ICD-10-CM

## 2018-01-11 DIAGNOSIS — C61 Malignant neoplasm of prostate: Secondary | ICD-10-CM

## 2018-01-11 DIAGNOSIS — Z8042 Family history of malignant neoplasm of prostate: Secondary | ICD-10-CM | POA: Insufficient documentation

## 2018-01-11 DIAGNOSIS — Z8546 Personal history of malignant neoplasm of prostate: Secondary | ICD-10-CM | POA: Insufficient documentation

## 2018-01-11 DIAGNOSIS — Z809 Family history of malignant neoplasm, unspecified: Secondary | ICD-10-CM | POA: Insufficient documentation

## 2018-01-11 DIAGNOSIS — I10 Essential (primary) hypertension: Secondary | ICD-10-CM | POA: Insufficient documentation

## 2018-01-11 NOTE — Progress Notes (Signed)
01/11/2018 8:51 PM   [name] Oct 29, 1936 [id number]  Referring provider: Madelyn Brunner, MD No address on file  Chief Complaint  Patient presents with  . Results    HPI: 82 year old male with a history of prostate cancer and recurrent nephrolithiasis (uric acid) who returns today to follow-up CT abdomen pelvis obtained for further evaluation of flank pain.  He was seen and evaluated by Zara Council on 12/28/2016 complaining of right flank pain.  Urocit K was initiated and a CT stone protocol was ordered.  He had a full potassium which was elevated to 5.8 and advised to stop potassium citrate.  He reports that he has been eating high potassium foods.  CT scan from 01/08/2018 reveals small bilateral nonobstructing calculi but no ureteral stones, hydronephrosis, or ureteral obstruction.  There is also no evidence of metastatic disease from his prostate cancer and bradycardia seeds are notable.  He does have an incidental right inguinal hernia.  He reports that his flank pain has essentially resolved.  No gross hematuria or significant urinary symptoms.   PMH: Past Medical History:  Diagnosis Date  . Acid reflux   . BP (high blood pressure) 10/02/2014  . BPH (benign prostatic hyperplasia)   . Elevated PSA   . Erectile dysfunction   . GERD (gastroesophageal reflux disease)   . Gross hematuria   . Hypertension   . Hypogonadism in male   . Kidney stone    bilateral  . Kidney stones 06/21/2015  . Microscopic hematuria   . Nephrolithiasis 2016  . Prostate cancer ([place])   . Renal stones 08/11/2015    Surgical History: Past Surgical History:  Procedure Laterality Date  . EXTRACORPOREAL SHOCK WAVE LITHOTRIPSY Right 07/23/2015   Procedure: EXTRACORPOREAL SHOCK WAVE LITHOTRIPSY (ESWL);  Surgeon: Hollice Espy, MD;  Location: [facility] ORS;  Service: Urology;  Laterality: Right;  . EXTRACORPOREAL SHOCK WAVE LITHOTRIPSY Left 09/03/2015   Procedure: EXTRACORPOREAL SHOCK  WAVE LITHOTRIPSY (ESWL);  Surgeon: Nickie Retort, MD;  Location: [facility] ORS;  Service: Urology;  Laterality: Left;  . extracorporeal shockwave lithotripsy  2016  . KIDNEY STONE SURGERY    . laser lithotripsy and stent placement  2005  . ORTHOPEDIC SURGERY    . PROSTATE SURGERY     seeds    Home Medications:  Allergies as of 01/11/2018      Reactions   Other Nausea And Vomiting   Darvocet   Oxycodone Nausea And Vomiting   Propoxyphene Nausea And Vomiting      Medication List        Accurate as of 01/11/18 11:59 PM. Always use your most recent med list.          benazepril 5 MG tablet Commonly known as:  LOTENSIN Take 5 mg by mouth 2 (two) times daily.   benazepril 5 MG tablet Commonly known as:  LOTENSIN Take by mouth.   CALCIUM 600 600 MG Tabs tablet Generic drug:  calcium carbonate Take by mouth.   FISH OIL + D3 PO Take 1,200 mg by mouth daily.   gabapentin 300 MG capsule Commonly known as:  NEURONTIN Take by mouth.   Ginkgo Biloba 40 MG Tabs Take 120 mg by mouth daily.   ibuprofen 200 MG tablet Commonly known as:  ADVIL,MOTRIN Take 200 mg by mouth daily.   Lysine 500 MG Caps Take 500 mg by mouth 2 (two) times daily.   omeprazole 20 MG capsule Commonly known as:  PRILOSEC Take 20 mg by mouth  2 (two) times daily before a meal.   omeprazole 20 MG capsule Commonly known as:  PRILOSEC Take by mouth.   tamsulosin 0.4 MG Caps capsule Commonly known as:  FLOMAX Take 1 capsule (0.4 mg total) by mouth daily.   Vitamin D3 2000 units capsule Take 2,000 Units by mouth daily.       Allergies:  Allergies  Allergen Reactions  . Other Nausea And Vomiting    Darvocet  . Oxycodone Nausea And Vomiting  . Propoxyphene Nausea And Vomiting    Family History: Family History  Problem Relation Age of Onset  . Hypertension Mother   . Cancer Maternal Grandfather   . Cancer Brother   . Prostate cancer Brother   . Kidney disease Neg Hx   . Bladder Cancer  Neg Hx     Social History:  reports that  has never smoked. he has never used smokeless tobacco. He reports that he does not drink alcohol or use drugs.  ROS: UROLOGY Frequent Urination?: No Hard to postpone urination?: No Burning/pain with urination?: No Get up at night to urinate?: No Leakage of urine?: No Urine stream starts and stops?: No Trouble starting stream?: No Do you have to strain to urinate?: No Blood in urine?: No Urinary tract infection?: No Sexually transmitted disease?: No Injury to kidneys or bladder?: No Painful intercourse?: No Weak stream?: No Erection problems?: No Penile pain?: No  Gastrointestinal Nausea?: No Vomiting?: No Indigestion/heartburn?: No Diarrhea?: No Constipation?: No  Constitutional Fever: No Night sweats?: No Weight loss?: No Fatigue?: No  Skin Skin rash/lesions?: No Itching?: No  Eyes Blurred vision?: No Double vision?: No  Ears/Nose/Throat Sore throat?: No Sinus problems?: No  Hematologic/Lymphatic Swollen glands?: No Easy bruising?: No  Cardiovascular Leg swelling?: No Chest pain?: No  Respiratory Cough?: No Shortness of breath?: No  Endocrine Excessive thirst?: No  Musculoskeletal Back pain?: Yes Joint pain?: No  Neurological Headaches?: No Dizziness?: No  Psychologic Depression?: No Anxiety?: No  Physical Exam: BP (!) 152/53   Pulse 65   Ht 5\' 8"  (1.727 m)   Wt 200 lb (90.7 kg)   BMI 30.41 kg/m   Constitutional:  Alert and oriented, No acute distress.  Accompanied by wife today. HEENT: [place] AT, moist mucus membranes.  Trachea midline, no masses. Cardiovascular: No clubbing, cyanosis, or edema. Respiratory: Normal respiratory effort, no increased work of breathing. Skin: No rashes, bruises or suspicious lesions. Neurologic: Grossly intact, no focal deficits, moving all 4 extremities. Psychiatric: Normal mood and affect.  Laboratory Data: Lab Results  Component Value Date   WBC 4.2  12/28/2017   HGB 12.3 (L) 12/28/2017   HCT 37.2 (L) 12/28/2017   MCV 89 12/28/2017   PLT 207 12/28/2017    Lab Results  Component Value Date   CREATININE 1.20 12/28/2017    Lab Results  Component Value Date   PSA1 <0.1 12/26/2017   PSA1 <0.1 06/02/2017   PSA1 <0.1 12/05/2016    Urinalysis Lab Results  Component Value Date   SPECGRAV 1.015 12/28/2017   PHUR 5.0 12/28/2017   COLORU Yellow 12/28/2017   APPEARANCEUR Clear 12/28/2017   LEUKOCYTESUR Negative 12/28/2017   PROTEINUR Negative 12/28/2017   GLUCOSEU Negative 12/28/2017   KETONESU Negative 12/28/2017   RBCU Trace (A) 12/28/2017   BILIRUBINUR Negative 12/28/2017   UUROB 0.2 12/28/2017   NITRITE Negative 12/28/2017    Lab Results  Component Value Date   LABMICR See below: 12/28/2017   WBCUA None seen 12/28/2017   RBCUA  0-2 12/28/2017   LABEPIT None seen 12/28/2017   MUCUS Present (A) 06/08/2016   BACTERIA None seen 12/28/2017    Pertinent Imaging: Results for orders placed during the hospital encounter of 01/08/18  CT RENAL STONE STUDY   Narrative CLINICAL DATA:  Right flank pain for 2 weeks. Nephrolithiasis. Personal history of prostate carcinoma.  EXAM: CT ABDOMEN AND PELVIS WITHOUT CONTRAST  TECHNIQUE: Multidetector CT imaging of the abdomen and pelvis was performed following the standard protocol without IV contrast.  COMPARISON:  07/06/2015  FINDINGS: Lower chest: No acute findings.  Hepatobiliary: Small low-attenuation lesion in segment 3 of the left lobe remains stable. No new or enlarging liver lesions seen on this unenhanced exam. Gallbladder is unremarkable.  Pancreas: No mass or inflammatory process visualized on this unenhanced exam.  Spleen:  Within normal limits in size.  Adrenals/Urinary tract: Small less than 1 cm nonobstructing calculi are again seen in both kidneys. Stable small fluid attenuation left renal cyst. No evidence ureteral calculi or  hydronephrosis. Unremarkable unopacified urinary bladder.  Stomach/Bowel: No evidence of obstruction, inflammatory process, or abnormal fluid collections.  Vascular/Lymphatic: No pathologically enlarged lymph nodes identified. No evidence of abdominal aortic aneurysm. Aortic atherosclerosis.  Reproductive: Brachytherapy seeds again seen throughout the prostate. Seminal vesicles are symmetric.  Other:  Stable small right inguinal hernia.  Musculoskeletal:  No suspicious bone lesions identified.  IMPRESSION: Small nonobstructing bilateral renal calculi. No evidence of ureteral calculi, hydronephrosis, or other acute findings.  Prostate brachytherapy seeds. No evidence of pelvic metastatic disease.  Stable small right inguinal hernia.   Electronically Signed   By: Earle Gell M.D.   On: 01/08/2018 12:13    CT scan personally reviewed today with the patient.  Assessment & Plan:     1. Hyperkalemia Elevated potassium to 5.57-month ago Agree with discontinuation of potassium citrate Repeat potassium today-if remains elevated, will refer back to PCP to adjust other medications as needed Advised to stop all high potassium containing foods - Potassium  2. Kidney stones Bilateral nonobstructing calculi, relatively small No indication for intervention at this time as he is asymptomatic Warning symptoms reviewed Recommend KUB in 6 months and follow-up as soon as scheduled  3. Prostate cancer [facility]) PSA undetectable Scheduled for six-month follow-up in 06/2018   Follow-up with Zara Council is scheduled  Hollice Espy, MD  [place] [address] [place], [place] [address] [phone]

## 2018-01-12 ENCOUNTER — Telehealth: Payer: Self-pay

## 2018-01-12 LAB — POTASSIUM: POTASSIUM: 5.4 mmol/L — AB (ref 3.5–5.2)

## 2018-01-12 NOTE — Telephone Encounter (Signed)
Spoke with pt wife in reference to lab results and needing to see PCP. Wife voiced understanding.

## 2018-01-12 NOTE — Telephone Encounter (Signed)
-----   Message from Hollice Espy, MD sent at 01/12/2018  7:53 AM EST ----- Your potassium is still mildly elevated.  You need to stop eating high potassium foods including bananans and see your PCP soon to discuss.  No more potassium citrate as discussed.    Hollice Espy, MD

## 2018-01-14 NOTE — Progress Notes (Signed)
[place]  [phone] [phone]  ID: Alex Lang OB: 12-04-1936  MR#: [id number]  GEX#:[id number]  Patient Care Team: Madelyn Brunner, MD as PCP - General (Internal Medicine)  CHIEF COMPLAINT:  Prostate cancer, stage TIIa N0 M0 Gleason 4+3.  INTERVAL HISTORY: Patient returns to clinic today for routine yearly follow-up. He continues to feel well and remains asymptomatic. He has no neurologic complaints. He denies any recent fevers or illnesses. He has a good appetite and denies weight loss. He has no chest pain or shortness of breath. He denies any nausea, vomiting, constipation, or diarrhea. He has no urinary complaints. Patient feels at his baseline and offers no specific complaints today.  REVIEW OF SYSTEMS:   Review of Systems  Constitutional: Negative.  Negative for fever, malaise/fatigue and weight loss.  Respiratory: Negative.  Negative for shortness of breath.   Cardiovascular: Negative.  Negative for chest pain and leg swelling.  Gastrointestinal: Negative.  Negative for abdominal pain.  Genitourinary: Negative.  Negative for dysuria, frequency and urgency.  Musculoskeletal: Positive for joint pain.  Skin: Negative.  Negative for rash.  Neurological: Negative.  Negative for sensory change and weakness.  Psychiatric/Behavioral: Negative.  The patient is not nervous/anxious.     As per HPI. Otherwise, a complete review of systems is negative.  PAST MEDICAL HISTORY: Past Medical History:  Diagnosis Date  . Acid reflux   . BP (high blood pressure) 10/02/2014  . BPH (benign prostatic hyperplasia)   . Elevated PSA   . Erectile dysfunction   . GERD (gastroesophageal reflux disease)   . Gross hematuria   . Hypertension   . Hypogonadism in male   . Kidney stone    bilateral  . Kidney stones 06/21/2015  . Microscopic hematuria   . Nephrolithiasis 2016  . Prostate cancer ([place])   . Renal stones 08/11/2015    PAST  SURGICAL HISTORY: Past Surgical History:  Procedure Laterality Date  . EXTRACORPOREAL SHOCK WAVE LITHOTRIPSY Right 07/23/2015   Procedure: EXTRACORPOREAL SHOCK WAVE LITHOTRIPSY (ESWL);  Surgeon: Hollice Espy, MD;  Location: [facility] ORS;  Service: Urology;  Laterality: Right;  . EXTRACORPOREAL SHOCK WAVE LITHOTRIPSY Left 09/03/2015   Procedure: EXTRACORPOREAL SHOCK WAVE LITHOTRIPSY (ESWL);  Surgeon: Nickie Retort, MD;  Location: [facility] ORS;  Service: Urology;  Laterality: Left;  . extracorporeal shockwave lithotripsy  2016  . KIDNEY STONE SURGERY    . laser lithotripsy and stent placement  2005  . ORTHOPEDIC SURGERY    . PROSTATE SURGERY     seeds    FAMILY HISTORY: Family History  Problem Relation Age of Onset  . Hypertension Mother   . Cancer Maternal Grandfather   . Cancer Brother   . Prostate cancer Brother   . Kidney disease Neg Hx   . Bladder Cancer Neg Hx        ADVANCED DIRECTIVES:    HEALTH MAINTENANCE: Social History   Tobacco Use  . Smoking status: Never Smoker  . Smokeless tobacco: Never Used  Substance Use Topics  . Alcohol use: No    Alcohol/week: 0.0 oz  . Drug use: No     Colonoscopy:  PAP:  Bone density:  Lipid panel:  Allergies  Allergen Reactions  . Other Nausea And Vomiting    Darvocet  . Oxycodone Nausea And Vomiting  . Propoxyphene Nausea And Vomiting    Current Outpatient Medications  Medication Sig Dispense Refill  . calcium carbonate (CALCIUM 600) 600 MG TABS tablet Take by  mouth.    . Cholecalciferol (VITAMIN D3) 2000 UNITS capsule Take 2,000 Units by mouth daily.     Marland Kitchen Fish Oil-Cholecalciferol (FISH OIL + D3 PO) Take 1,200 mg by mouth daily.     Marland Kitchen gabapentin (NEURONTIN) 300 MG capsule Take by mouth.    . Ginkgo Biloba 40 MG TABS Take 120 mg by mouth daily.     Marland Kitchen ibuprofen (ADVIL,MOTRIN) 200 MG tablet Take 200 mg by mouth daily.    Marland Kitchen Lysine 500 MG CAPS Take 500 mg by mouth 2 (two) times daily.     . tamsulosin (FLOMAX) 0.4 MG  CAPS capsule Take 1 capsule (0.4 mg total) by mouth daily. 90 capsule 2  . benazepril (LOTENSIN) 5 MG tablet Take 5 mg by mouth 2 (two) times daily.     . benazepril (LOTENSIN) 5 MG tablet Take by mouth.    Marland Kitchen omeprazole (PRILOSEC) 20 MG capsule Take 20 mg by mouth 2 (two) times daily before a meal.     . omeprazole (PRILOSEC) 20 MG capsule Take by mouth.     No current facility-administered medications for this visit.     OBJECTIVE: Vitals:   01/18/18 0948  BP: (!) 144/56  Pulse: (!) 57  Resp: 18  Temp: (!) 96.7 F (35.9 C)     Body mass index is 31.98 kg/m.    ECOG FS:0 - Asymptomatic  General: Well-developed, well-nourished, no acute distress. Eyes: Pink conjunctiva, anicteric sclera. Lungs: Clear to auscultation bilaterally. Heart: Regular rate and rhythm. No rubs, murmurs, or gallops. Abdomen: Soft, nontender, nondistended. No organomegaly noted, normoactive bowel sounds. Musculoskeletal: No edema, cyanosis, or clubbing. Neuro: Alert, answering all questions appropriately. Cranial nerves grossly intact. Skin: No rashes or petechiae noted. Psych: Normal affect.   LAB RESULTS:  Lab Results  Component Value Date   NA 137 12/28/2017   K 5.4 (H) 01/11/2018   CL 103 12/28/2017   CO2 23 12/28/2017   GLUCOSE 149 (H) 12/28/2017   BUN 28 (H) 12/28/2017   CREATININE 1.20 12/28/2017   CALCIUM 9.5 12/28/2017   PROT 6.9 07/14/2016   ALBUMIN 4.1 07/14/2016   AST 25 07/14/2016   ALT 57 07/14/2016   ALKPHOS 91 07/14/2016   BILITOT 0.7 07/14/2016   GFRNONAA 57 (L) 12/28/2017   GFRAA 66 12/28/2017    Lab Results  Component Value Date   WBC 4.2 12/28/2017   NEUTROABS 1.9 07/14/2016   HGB 12.3 (L) 12/28/2017   HCT 37.2 (L) 12/28/2017   MCV 89 12/28/2017   PLT 207 12/28/2017   Lab Results  Component Value Date   PSA 0.05 07/14/2016     STUDIES: Ct Renal Stone Study  Result Date: 01/08/2018 CLINICAL DATA:  Right flank pain for 2 weeks. Nephrolithiasis. Personal  history of prostate carcinoma. EXAM: CT ABDOMEN AND PELVIS WITHOUT CONTRAST TECHNIQUE: Multidetector CT imaging of the abdomen and pelvis was performed following the standard protocol without IV contrast. COMPARISON:  07/06/2015 FINDINGS: Lower chest: No acute findings. Hepatobiliary: Small low-attenuation lesion in segment 3 of the left lobe remains stable. No new or enlarging liver lesions seen on this unenhanced exam. Gallbladder is unremarkable. Pancreas: No mass or inflammatory process visualized on this unenhanced exam. Spleen:  Within normal limits in size. Adrenals/Urinary tract: Small less than 1 cm nonobstructing calculi are again seen in both kidneys. Stable small fluid attenuation left renal cyst. No evidence ureteral calculi or hydronephrosis. Unremarkable unopacified urinary bladder. Stomach/Bowel: No evidence of obstruction, inflammatory process, or abnormal fluid  collections. Vascular/Lymphatic: No pathologically enlarged lymph nodes identified. No evidence of abdominal aortic aneurysm. Aortic atherosclerosis. Reproductive: Brachytherapy seeds again seen throughout the prostate. Seminal vesicles are symmetric. Other:  Stable small right inguinal hernia. Musculoskeletal:  No suspicious bone lesions identified. IMPRESSION: Small nonobstructing bilateral renal calculi. No evidence of ureteral calculi, hydronephrosis, or other acute findings. Prostate brachytherapy seeds. No evidence of pelvic metastatic disease. Stable small right inguinal hernia. Electronically Signed   By: Earle Gell M.D.   On: 01/08/2018 12:13    ASSESSMENT: Prostate cancer, stage TIIa N0 M0 Gleason 4+3.  PLAN:    1. Prostate cancer:  Patient received radioactive seed implants in January 2015. He also initiated androgen deprivation therapy approximately at the same time. He received his last dose of Lupron in March 2016.  Patient reports he had significant side effects of Lupron including extreme fatigue.  His PSA continues  to be less than 0.1.  CT scan results from January 08, 2018 reviewed independently and report as above with no evidence of disease.  No intervention is needed at this time. Patient expressed understanding that if his PSA begins to increase, he may have to reinitiate Lupron. Return to clinic in 1 year for repeat laboratory work and further evaluation. Continue follow-up with urology as scheduled. 2. Leukopenia: Resolved. 3. Hypertension: Patient's blood pressure is mildly elevated today. Continue current medications. Treatment per primary care.  Approximately 20 minutes spent in discussion of which greater than 50% was consultation.  Patient expressed understanding and was in agreement with this plan. He also understands that He can call clinic at any time with any questions, concerns, or complaints.    Lloyd Huger, MD   01/18/2018 10:14 AM

## 2018-01-18 ENCOUNTER — Inpatient Hospital Stay (HOSPITAL_BASED_OUTPATIENT_CLINIC_OR_DEPARTMENT_OTHER): Payer: Medicare Other | Admitting: Oncology

## 2018-01-18 VITALS — BP 144/56 | HR 57 | Temp 96.7°F | Resp 18 | Wt 210.3 lb

## 2018-01-18 DIAGNOSIS — Z8042 Family history of malignant neoplasm of prostate: Secondary | ICD-10-CM | POA: Diagnosis not present

## 2018-01-18 DIAGNOSIS — Z8546 Personal history of malignant neoplasm of prostate: Secondary | ICD-10-CM | POA: Diagnosis present

## 2018-01-18 DIAGNOSIS — Z809 Family history of malignant neoplasm, unspecified: Secondary | ICD-10-CM | POA: Diagnosis not present

## 2018-01-18 DIAGNOSIS — C61 Malignant neoplasm of prostate: Secondary | ICD-10-CM

## 2018-01-18 DIAGNOSIS — I10 Essential (primary) hypertension: Secondary | ICD-10-CM | POA: Diagnosis not present

## 2018-03-19 ENCOUNTER — Other Ambulatory Visit: Payer: Self-pay | Admitting: Urology

## 2018-03-19 DIAGNOSIS — N200 Calculus of kidney: Secondary | ICD-10-CM

## 2018-03-19 NOTE — Telephone Encounter (Signed)
Note opened and air

## 2018-06-25 ENCOUNTER — Other Ambulatory Visit: Payer: Medicare Other

## 2018-06-25 DIAGNOSIS — C61 Malignant neoplasm of prostate: Secondary | ICD-10-CM

## 2018-06-26 LAB — PSA

## 2018-07-01 NOTE — Progress Notes (Signed)
07/02/2018 9:31 AM   [name] Sep 26, 1936 [id number]  Referring provider: Madelyn Brunner, MD No address on file  Chief Complaint  Patient presents with  . Follow-up    6 month follow up    HPI: Patient is a 82 year old Caucasian male with a history of prostate cancer and recurrent nephrolithiasis who presents today for six-month follow-up with his wife, Alex Lang.    Prostate cancer Patient withcT2a Prostate cancer- Dx in 10/2014 with Gleason 3+4, 3+3 prostate cancer involving 8 of 13 cores, iPSA 4.8 DRE also somewhat concerning for cancer with questionable induration at right base. TRUS vol 45 cc. He underwent I-125 brachytherapy seed placement on 01/26/2015 by Dr. Hollice Espy. He also received adjunctive ADT therapy for 4-6 months. He is also being followed by the Cancer center and was last seen in 12/2017 by Dr Grayland Ormond.  CT scan in 12/2017 noted no evidence of metastatic disease and brachy seeds were visualized.  His last PSA in 06/25/2018 was <0.1 ng/mL.   I PSS score 0/0.   Previous I PSS score 0/0.  Currently on Flomax 0.4 mg daily.    Recurrent nephrolithiasis CT scan from 01/08/2018 reveals small bilateral nonobstructing calculi but no ureteral stones, hydronephrosis, or ureteral obstruction.  There is also no evidence of metastatic disease from his prostate cancer and bradycardia seeds are notable.  He does have an incidental right inguinal hernia.  He is passing stones spontaneously without pain.  Patient experienced hyperkalemia with Urocit-K.   KUB on 07/02/2018 noted 0.7 cm nonobstructing stone left kidney. Additional small nonobstructing stones in the left kidney seen on CT scan are not visible on this examination. No other urinary tract stones are identified.  PMH: Past Medical History:  Diagnosis Date  . Acid reflux   . BP (high blood pressure) 10/02/2014  . BPH (benign prostatic hyperplasia)   . Elevated PSA   . Erectile dysfunction   . GERD  (gastroesophageal reflux disease)   . Gross hematuria   . Hypertension   . Hypogonadism in male   . Kidney stone    bilateral  . Kidney stones 06/21/2015  . Microscopic hematuria   . Nephrolithiasis 2016  . Prostate cancer ([place])   . Renal stones 08/11/2015    Surgical History: Past Surgical History:  Procedure Laterality Date  . EXTRACORPOREAL SHOCK WAVE LITHOTRIPSY Right 07/23/2015   Procedure: EXTRACORPOREAL SHOCK WAVE LITHOTRIPSY (ESWL);  Surgeon: Hollice Espy, MD;  Location: [facility] ORS;  Service: Urology;  Laterality: Right;  . EXTRACORPOREAL SHOCK WAVE LITHOTRIPSY Left 09/03/2015   Procedure: EXTRACORPOREAL SHOCK WAVE LITHOTRIPSY (ESWL);  Surgeon: Nickie Retort, MD;  Location: [facility] ORS;  Service: Urology;  Laterality: Left;  . extracorporeal shockwave lithotripsy  2016  . KIDNEY STONE SURGERY    . laser lithotripsy and stent placement  2005  . ORTHOPEDIC SURGERY    . PROSTATE SURGERY     seeds    Home Medications:  Allergies as of 07/02/2018      Reactions   Other Nausea And Vomiting   Darvocet   Oxycodone Nausea And Vomiting   Propoxyphene Nausea And Vomiting      Medication List        Accurate as of 07/02/18  9:31 AM. Always use your most recent med list.          benazepril 5 MG tablet Commonly known as:  LOTENSIN Take 5 mg by mouth 2 (two) times daily.   benazepril 5 MG tablet Commonly known  as:  LOTENSIN Take by mouth.   CALCIUM 600 600 MG Tabs tablet Generic drug:  calcium carbonate Take by mouth.   FISH OIL + D3 PO Take 1,200 mg by mouth daily.   gabapentin 300 MG capsule Commonly known as:  NEURONTIN Take by mouth.   Ginkgo Biloba 40 MG Tabs Take 120 mg by mouth daily.   ibuprofen 200 MG tablet Commonly known as:  ADVIL,MOTRIN Take 200 mg by mouth daily.   Lysine 500 MG Caps Take 500 mg by mouth 2 (two) times daily.   omeprazole 20 MG capsule Commonly known as:  PRILOSEC Take 20 mg by mouth 2 (two) times daily before a meal.     omeprazole 20 MG capsule Commonly known as:  PRILOSEC Take by mouth.   tamsulosin 0.4 MG Caps capsule Commonly known as:  FLOMAX TAKE ONE (1) CAPSULE EACH DAY   Vitamin D3 2000 units capsule Take 2,000 Units by mouth daily.       Allergies:  Allergies  Allergen Reactions  . Other Nausea And Vomiting    Darvocet  . Oxycodone Nausea And Vomiting  . Propoxyphene Nausea And Vomiting    Family History: Family History  Problem Relation Age of Onset  . Hypertension Mother   . Cancer Maternal Grandfather   . Cancer Brother   . Prostate cancer Brother   . Kidney disease Neg Hx   . Bladder Cancer Neg Hx     Social History:  reports that he has never smoked. He has never used smokeless tobacco. He reports that he does not drink alcohol or use drugs.  ROS: UROLOGY Frequent Urination?: No Hard to postpone urination?: No Burning/pain with urination?: No Get up at night to urinate?: No Leakage of urine?: No Urine stream starts and stops?: No Trouble starting stream?: No Do you have to strain to urinate?: No Blood in urine?: No Urinary tract infection?: No Sexually transmitted disease?: No Injury to kidneys or bladder?: No Painful intercourse?: No Weak stream?: No Erection problems?: No Penile pain?: No  Gastrointestinal Nausea?: No Vomiting?: No Indigestion/heartburn?: No Diarrhea?: No Constipation?: No  Constitutional Fever: No Night sweats?: No Weight loss?: No Fatigue?: No  Skin Skin rash/lesions?: No Itching?: No  Eyes Blurred vision?: No Double vision?: No  Ears/Nose/Throat Sore throat?: No Sinus problems?: No  Hematologic/Lymphatic Swollen glands?: No Easy bruising?: No  Cardiovascular Leg swelling?: No Chest pain?: No  Respiratory Cough?: No Shortness of breath?: No  Endocrine Excessive thirst?: No  Musculoskeletal Back pain?: No Joint pain?: No  Neurological Headaches?: No Dizziness?: No  Psychologic Depression?:  No Anxiety?: No  Physical Exam: BP (!) 155/68   Pulse (!) 56   Wt 200 lb (90.7 kg)   BMI 30.41 kg/m   Constitutional: Well nourished. Alert and oriented, No acute distress. HEENT: [place] AT, moist mucus membranes. Trachea midline, no masses. Cardiovascular: No clubbing, cyanosis, or edema. Respiratory: Normal respiratory effort, no increased work of breathing. GI: Abdomen is soft, non tender, non distended, no abdominal masses. Liver and spleen not palpable.  No hernias appreciated.  Stool sample for occult testing is not indicated.   GU: No CVA tenderness.  No bladder fullness or masses.  Patient with circumcised phallus.   Urethral meatus is patent.  No penile discharge. No penile lesions or rashes. Scrotum without lesions, cysts, rashes and/or edema.  Testicles are located scrotally bilaterally. No masses are appreciated in the testicles. Left and right epididymis are normal. Rectal: Patient with  normal sphincter tone.  Anus and perineum without scarring or rashes. No rectal masses are appreciated. Prostate is approximately 45 grams, no nodules are appreciated. Seminal vesicles are normal. Skin: No rashes, bruises or suspicious lesions. Lymph: No cervical or inguinal adenopathy. Neurologic: Grossly intact, no focal deficits, moving all 4 extremities. Psychiatric: Normal mood and affect.  Laboratory Data: Lab Results  Component Value Date   WBC 4.2 12/28/2017   HGB 12.3 (L) 12/28/2017   HCT 37.2 (L) 12/28/2017   MCV 89 12/28/2017   PLT 207 12/28/2017    Lab Results  Component Value Date   CREATININE 1.20 12/28/2017    Lab Results  Component Value Date   PSA1 <0.1 06/25/2018   PSA1 <0.1 12/26/2017   PSA1 <0.1 06/02/2017    Urinalysis Lab Results  Component Value Date   SPECGRAV 1.015 12/28/2017   PHUR 5.0 12/28/2017   COLORU Yellow 12/28/2017   APPEARANCEUR Clear 12/28/2017   LEUKOCYTESUR Negative 12/28/2017   PROTEINUR Negative 12/28/2017   GLUCOSEU Negative  12/28/2017   KETONESU Negative 12/28/2017   RBCU Trace (A) 12/28/2017   BILIRUBINUR Negative 12/28/2017   UUROB 0.2 12/28/2017   NITRITE Negative 12/28/2017    Lab Results  Component Value Date   LABMICR See below: 12/28/2017   WBCUA None seen 12/28/2017   RBCUA 0-2 12/28/2017   LABEPIT None seen 12/28/2017   MUCUS Present (A) 06/08/2016   BACTERIA None seen 12/28/2017    Pertinent Imaging: CLINICAL DATA:  History of kidney stones.  EXAM: ABDOMEN - 1 VIEW  COMPARISON:  CT abdomen and pelvis 01/08/2018.  KUB 11/28/2016.  FINDINGS: 0.7 cm in diameter calcification projecting over the upper pole of the left kidney corresponds with the nonobstructing stone seen on the prior CT. Additional punctate nonobstructing left renal stones seen on CT scan are difficult to visualize on this examination. No evidence of ureteral stone. No right renal or ureteral stones are identified. Bowel gas pattern is normal. Convex right scoliosis and lumbar spondylosis are noted. Brachytherapy seeds are seen in the prostate gland.  IMPRESSION: 0.7 cm nonobstructing stone left kidney. Additional small nonobstructing stones in the left kidney seen on CT scan are not visible on this examination. No other urinary tract stones are identified.   Electronically Signed   By: Inge Rise M.D.   On: 07/02/2018 10:13  I have independently reviewed the films  Assessment & Plan:     1. Kidney stones Bilateral nonobstructing calculi, relatively small No indication for intervention at this time as he is asymptomatic Warning symptoms reviewed KUB on 07/02/2018 noted stable left renal stone - passed some stones spontaneously  Recommend KUB in 6 months Continue Flomax: refills given  2. Prostate cancer [facility]) PSA undetectable Scheduled for six-month follow-up in 12/2018   Zara Council, PA-C  [place] [address] [place], [place]  [phone]

## 2018-07-02 ENCOUNTER — Other Ambulatory Visit: Payer: Self-pay

## 2018-07-02 ENCOUNTER — Ambulatory Visit
Admission: RE | Admit: 2018-07-02 | Discharge: 2018-07-02 | Disposition: A | Discharge status: Home / Self Care | Payer: Medicare Other | Source: Ambulatory Visit | Attending: Urology | Admitting: Urology

## 2018-07-02 ENCOUNTER — Encounter: Payer: Self-pay | Admitting: Urology

## 2018-07-02 ENCOUNTER — Ambulatory Visit (INDEPENDENT_AMBULATORY_CARE_PROVIDER_SITE_OTHER): Payer: Medicare Other | Admitting: Urology

## 2018-07-02 ENCOUNTER — Telehealth: Payer: Self-pay

## 2018-07-02 VITALS — BP 155/68 | HR 56 | Wt 200.0 lb

## 2018-07-02 DIAGNOSIS — N200 Calculus of kidney: Secondary | ICD-10-CM | POA: Insufficient documentation

## 2018-07-02 DIAGNOSIS — R109 Unspecified abdominal pain: Secondary | ICD-10-CM | POA: Insufficient documentation

## 2018-07-02 DIAGNOSIS — C61 Malignant neoplasm of prostate: Secondary | ICD-10-CM

## 2018-07-02 MED ORDER — TAMSULOSIN HCL 0.4 MG PO CAPS
ORAL_CAPSULE | ORAL | 3 refills | Status: DC
Start: 2018-07-02 — End: 2019-07-23

## 2018-07-02 NOTE — Telephone Encounter (Signed)
-----   Message from Nori Riis, PA-C sent at 07/02/2018  1:11 PM EDT ----- Please let Mr. Mccutchan know that his X-ray shows the left stone is still there and has not changed in size or position.

## 2018-07-19 ENCOUNTER — Other Ambulatory Visit: Payer: Medicare Other

## 2018-08-06 ENCOUNTER — Other Ambulatory Visit: Payer: BLUE CROSS/BLUE SHIELD

## 2018-08-13 ENCOUNTER — Other Ambulatory Visit: Payer: Self-pay | Admitting: *Deleted

## 2018-08-13 ENCOUNTER — Other Ambulatory Visit: Payer: Self-pay

## 2018-08-13 ENCOUNTER — Ambulatory Visit
Admission: RE | Admit: 2018-08-13 | Discharge: 2018-08-13 | Disposition: A | Discharge status: Home / Self Care | Payer: Medicare Other | Source: Ambulatory Visit | Attending: Radiation Oncology | Admitting: Radiation Oncology

## 2018-08-13 ENCOUNTER — Encounter: Payer: Self-pay | Admitting: Radiation Oncology

## 2018-08-13 DIAGNOSIS — C61 Malignant neoplasm of prostate: Secondary | ICD-10-CM | POA: Insufficient documentation

## 2018-08-13 DIAGNOSIS — Z923 Personal history of irradiation: Secondary | ICD-10-CM | POA: Diagnosis not present

## 2018-08-13 NOTE — Progress Notes (Signed)
Radiation Oncology Follow up Note  Name: Alex Lang   Date:   08/13/2018 MRN:  [id number] DOB: 1936/06/11    This 82 y.o. male presents to the clinic today for 3-1/2 year follow-up status post I-125 interstitial implant for stage IIa adenocarcinoma the prostate.  REFERRING PROVIDER: Madelyn Brunner, MD  HPI: patient is a 82 year old male now out 3 and half years having completed I-125 interstitial implant for Gleason 6 adenocarcinoma the prostate presenting the PSA 4.8. He is seen today in routine follow-up is doing well. He specifically denies any lower urinary tract symptoms diarrhea fatigue. His PSAs have remained less than 0.1.Marland Kitchenhis last PSA was in July 7564  COMPLICATIONS OF TREATMENT: none  FOLLOW UP COMPLIANCE: keeps appointments   PHYSICAL EXAM:  BP (!) (P) 184/67 (BP Location: Left Arm, Patient Position: Sitting)   Pulse (!) (P) 50   Temp (P) 98 F (36.7 C) (Tympanic)   Wt (P) 209 lb 1.7 oz (94.8 kg)   BMI (P) 31.79 kg/m  On rectal exam rectal sphincter tone is good. Prostate is smooth contracted without evidence of nodularity or mass. Sulcus is preserved bilaterally. No discrete nodularity is identified. No other rectal abnormalities are noted. Well-developed well-nourished patient in NAD. HEENT reveals PERLA, EOMI, discs not visualized.  Oral cavity is clear. No oral mucosal lesions are identified. Neck is clear without evidence of cervical or supraclavicular adenopathy. Lungs are clear to A&P. Cardiac examination is essentially unremarkable with regular rate and rhythm without murmur rub or thrill. Abdomen is benign with no organomegaly or masses noted. Motor sensory and DTR levels are equal and symmetric in the upper and lower extremities. Cranial nerves II through XII are grossly intact. Proprioception is intact. No peripheral adenopathy or edema is identified. No motor or sensory levels are noted. Crude visual fields are within normal range.  RADIOLOGY RESULTS: no  current films for review  PLAN: present time patient continues to do well under excellent biochemical control of his prostate cancer. I've asked to see him back in 1 year for follow-up. Patient is to call at anytime with any concerns.  I would like to take this opportunity to thank you for allowing me to participate in the care of your patient.Noreene Filbert, MD

## 2018-12-26 ENCOUNTER — Other Ambulatory Visit: Payer: Medicare Other

## 2018-12-26 DIAGNOSIS — C61 Malignant neoplasm of prostate: Secondary | ICD-10-CM

## 2018-12-27 LAB — PSA: Prostate Specific Ag, Serum: <0.1 ng/mL (ref 0.0–4.0)

## 2019-01-02 ENCOUNTER — Ambulatory Visit (INDEPENDENT_AMBULATORY_CARE_PROVIDER_SITE_OTHER): Payer: Medicare Other | Admitting: Urology

## 2019-01-02 ENCOUNTER — Encounter: Payer: Self-pay | Admitting: Urology

## 2019-01-02 VITALS — BP 152/75 | HR 59 | Ht 68.0 in | Wt 204.0 lb

## 2019-01-02 DIAGNOSIS — C61 Malignant neoplasm of prostate: Secondary | ICD-10-CM

## 2019-01-02 DIAGNOSIS — N200 Calculus of kidney: Secondary | ICD-10-CM

## 2019-01-02 NOTE — Progress Notes (Signed)
01/02/2019 8:57 AM   [name] Oct 21, 1936 [id number]  Referring provider: Baxter Hire, MD [place], [place] [address]  Chief Complaint  Patient presents with  . Prostate Cancer    HPI: Patient is a 83 year old Caucasian male with a history of prostate cancer and recurrent nephrolithiasis who presents today for six-month follow-up with his wife, Alex Lang.    Pt reports of feeling well overall. He denies any gross hematuria, dysuria or suprapubic/flank pain. Patient denies any fevers, chills, nausea or vomiting. He denies any problems with stones. His BP is 152/75 and is going to reduce his salt intake.   Prostate cancer Patient withcT2a Prostate cancer- Dx in 10/2014 with Gleason 3+4, 3+3 prostate cancer involving 8 of 13 cores, iPSA 4.8 DRE also somewhat concerning for cancer with questionable induration at right base. TRUS vol 45 cc. He underwent I-125 brachytherapy seed placement on 01/26/2015 by Dr. Hollice Espy. He also received adjunctive ADT therapy for 4-6 months. He is also being followed by the Cancer center and was last seen in 12/2017 by Dr Grayland Ormond.  CT scan in 12/2017 noted no evidence of metastatic disease and brachy seeds were visualized.  His most recent PSA on 12/26/2018 was <0.1 ng/mL.   I PSS score 1/0.  Previous I PSS score 0/0.  Currently on Flomax 0.4 mg daily.    IPSS    Row Name 01/02/19 0800         International Prostate Symptom Score   How often have you had the sensation of not emptying your bladder?  Not at All     How often have you had to urinate less than every two hours?  Less than 1 in 5 times     How often have you found you stopped and started again several times when you urinated?  Not at All     How often have you found it difficult to postpone urination?  Not at All     How often have you had a weak urinary stream?  Not at All     How often have you had to strain to start urination?  Not at All     How many  times did you typically get up at night to urinate?  None     Total IPSS Score  1       Quality of Life due to urinary symptoms   If you were to spend the rest of your life with your urinary condition just the way it is now how would you feel about that?  Delighted        Score:  1-7 Mild 8-19 Moderate 20-35 Severe   Recurrent nephrolithiasis CT scan from 01/08/2018 reveals small bilateral nonobstructing calculi but no ureteral stones, hydronephrosis, or ureteral obstruction.  There is also no evidence of metastatic disease from his prostate cancer and bradycardia seeds are notable.  He does have an incidental right inguinal hernia.  He is passing stones spontaneously without pain.  Patient experienced hyperkalemia with Urocit-K.  KUB on 07/02/2018 noted 0.7 cm nonobstructing stone left kidney. Additional small nonobstructing stones in the left kidney seen on CT scan are not visible on this examination. No other urinary tract stones are identified.  PMH: Past Medical History:  Diagnosis Date  . Acid reflux   . BP (high blood pressure) 10/02/2014  . BPH (benign prostatic hyperplasia)   . Elevated PSA   . Erectile dysfunction   . GERD (gastroesophageal reflux disease)   .  Gross hematuria   . Hypertension   . Hypogonadism in male   . Kidney stone    bilateral  . Kidney stones 06/21/2015  . Microscopic hematuria   . Nephrolithiasis 2016  . Prostate cancer ([place])   . Renal stones 08/11/2015    Surgical History: Past Surgical History:  Procedure Laterality Date  . EXTRACORPOREAL SHOCK WAVE LITHOTRIPSY Right 07/23/2015   Procedure: EXTRACORPOREAL SHOCK WAVE LITHOTRIPSY (ESWL);  Surgeon: Hollice Espy, MD;  Location: [facility] OR[facility];  Service: Urology;  Laterality: Right;  . EXTRACORPOREAL SHOCK WAVE LITHOTRIPSY Left 09/03/2015   Procedure: EXTRACORPOREAL SHOCK WAVE LITHOTRIPSY (ESWL);  Surgeon: Nickie Retort, MD;  Location: [facility] ORS;  Service: Urology;  Laterality: Left;  .  extracorporeal shockwave lithotripsy  2016  . KIDNEY STONE SURGERY    . laser lithotripsy and stent placement  2005  . ORTHOPEDIC SURGERY    . PROSTATE SURGERY     seeds    Home Medications:  Allergies as of 01/02/2019      Reactions   Other Nausea And Vomiting   Darvocet   Oxycodone Nausea And Vomiting   Propoxyphene Nausea And Vomiting      Medication List       Accurate as of January 02, 2019  8:57 AM. Always use your most recent med list.        benazepril 5 MG tablet Commonly known as:  LOTENSIN Take 5 mg by mouth 2 (two) times daily.   benazepril 5 MG tablet Commonly known as:  LOTENSIN Take by mouth.   CALCIUM 600 600 MG Tabs tablet Generic drug:  calcium carbonate Take by mouth.   FISH OIL + D3 PO Take 1,200 mg by mouth daily.   Ginkgo Biloba 40 MG Tabs Take 120 mg by mouth daily.   ibuprofen 200 MG tablet Commonly known as:  ADVIL,MOTRIN Take 200 mg by mouth daily.   Lysine 500 MG Caps Take 500 mg by mouth 2 (two) times daily.   omeprazole 20 MG capsule Commonly known as:  PRILOSEC Take 20 mg by mouth 2 (two) times daily before a meal.   omeprazole 20 MG capsule Commonly known as:  PRILOSEC Take by mouth.   tamsulosin 0.4 MG Caps capsule Commonly known as:  FLOMAX TAKE ONE (1) CAPSULE EACH DAY   Vitamin D3 50 MCG (2000 UT) capsule Take 2,000 Units by mouth daily.       Allergies:  Allergies  Allergen Reactions  . Other Nausea And Vomiting    Darvocet  . Oxycodone Nausea And Vomiting  . Propoxyphene Nausea And Vomiting    Family History: Family History  Problem Relation Age of Onset  . Hypertension Mother   . Cancer Maternal Grandfather   . Cancer Brother   . Prostate cancer Brother   . Kidney disease Neg Hx   . Bladder Cancer Neg Hx     Social History:  reports that he has never smoked. He has never used smokeless tobacco. He reports that he does not drink alcohol or use drugs.  ROS: UROLOGY Frequent Urination?:  No Hard to postpone urination?: No Burning/pain with urination?: No Get up at night to urinate?: No Leakage of urine?: No Urine stream starts and stops?: No Trouble starting stream?: No Do you have to strain to urinate?: No Blood in urine?: No Urinary tract infection?: No Sexually transmitted disease?: No Injury to kidneys or bladder?: No Painful intercourse?: No Weak stream?: No Erection problems?: No Penile pain?: No  Gastrointestinal Nausea?: No  Vomiting?: No Indigestion/heartburn?: No Diarrhea?: No Constipation?: No  Constitutional Fever: No Night sweats?: No Weight loss?: No Fatigue?: No  Skin Skin rash/lesions?: No Itching?: No  Eyes Blurred vision?: No Double vision?: No  Ears/Nose/Throat Sore throat?: No Sinus problems?: No  Hematologic/Lymphatic Swollen glands?: No Easy bruising?: No  Cardiovascular Leg swelling?: No Chest pain?: No  Respiratory Cough?: No Shortness of breath?: No  Endocrine Excessive thirst?: No  Musculoskeletal Back pain?: No Joint pain?: No  Neurological Headaches?: No Dizziness?: No  Psychologic Depression?: No Anxiety?: No  Physical Exam: BP (!) 152/75   Pulse (!) 59   Ht 5\' 8"  (1.727 m)   Wt 204 lb (92.5 kg)   BMI 31.02 kg/m   Constitutional:  Well nourished. Alert and oriented, No acute distress. HEENT: [place] AT, moist mucus membranes.  Trachea midline, no masses. Cardiovascular: No clubbing, cyanosis, or edema. Respiratory: Normal respiratory effort, no increased work of breathing. GU: No CVA tenderness.  No bladder fullness or masses.  Patient with circumcised. Urethral meatus is patent.  No penile discharge. No penile lesions or rashes. Scrotum without lesions, cysts, rashes and/or edema.  Testicles are located scrotally bilaterally. No masses are appreciated in the testicles. Left and right epididymis are normal. Rectal: Patient with  normal sphincter tone. Anus and perineum without scarring or rashes.  No rectal masses are appreciated. Prostate is approximately 35 grams, no nodules are appreciated. Seminal vesicles are normal. Skin: No rashes, bruises or suspicious lesions. Neurologic: Grossly intact, no focal deficits, moving all 4 extremities. Psychiatric: Normal mood and affect.   Laboratory Data:  Lab Results  Component Value Date   PSA1 <0.1 12/26/2018   PSA1 <0.1 06/25/2018   PSA1 <0.1 12/26/2017    Pertinent Imaging: CLINICAL DATA:  History of kidney stones.  EXAM: ABDOMEN - 1 VIEW  COMPARISON:  CT abdomen and pelvis 01/08/2018.  KUB 11/28/2016.  FINDINGS: 0.7 cm in diameter calcification projecting over the upper pole of the left kidney corresponds with the nonobstructing stone seen on the prior CT. Additional punctate nonobstructing left renal stones seen on CT scan are difficult to visualize on this examination. No evidence of ureteral stone. No right renal or ureteral stones are identified. Bowel gas pattern is normal. Convex right scoliosis and lumbar spondylosis are noted. Brachytherapy seeds are seen in the prostate gland.  IMPRESSION: 0.7 cm nonobstructing stone left kidney. Additional small nonobstructing stones in the left kidney seen on CT scan are not visible on this examination. No other urinary tract stones are identified.   Electronically Signed   By: Inge Rise M.D.   On: 07/02/2018 10:13  I have independently reviewed CT scan independent and with pt. Noted for 0.7 cm in diameter calcification.   Assessment & Plan:     1. Kidney stones Bilateral nonobstructing calculi, relatively small No indication for intervention at this time as he is asymptomatic Warning symptoms reviewed KUB on 07/02/2018 noted stable left renal stone - passed some stones spontaneously  Recommend KUB in 12 months (around 06/2019) for IPSS, PSA, and exam  Continue Flomax  2. Prostate cancer ([place]) PSA undetectable I PSS score today is 1/0,  stable Scheduled for six-month follow-up in 06/2019   [facility] [address], [place], [place] [address] [phone]  I, Lucas Mallow, am acting as a Education administrator for [employer],  I have reviewed the above documentation for accuracy and completeness, and I agree with the above.  Zara Council, PA-C

## 2019-01-13 NOTE — Progress Notes (Signed)
[facility]  [phone] [phone]  ID: TERIK HAUGHEY OB: 09-Sep-1936  MR#: [id number]  ZES#:[id number]  Patient Care Team: Baxter Hire, MD as PCP - General (Internal Medicine)  CHIEF COMPLAINT:  Prostate cancer, stage TIIa N0 M0 Gleason 4+3.  INTERVAL HISTORY: Patient returns to clinic today for repeat laboratory work and routine yearly follow-up.  He has had issues with kidney stones, but otherwise has felt well and is asymptomatic.  He denies any pain today.  He has no neurologic complaints. He denies any recent fevers or illnesses. He has a good appetite and denies weight loss. He has no chest pain or shortness of breath. He denies any nausea, vomiting, constipation, or diarrhea. He has no urinary complaints.  Patient feels at his baseline offers no specific complaints today.  REVIEW OF SYSTEMS:   Review of Systems  Constitutional: Negative.  Negative for fever, malaise/fatigue and weight loss.  Respiratory: Negative.  Negative for shortness of breath.   Cardiovascular: Negative.  Negative for chest pain and leg swelling.  Gastrointestinal: Negative.  Negative for abdominal pain.  Genitourinary: Negative.  Negative for dysuria, frequency and urgency.  Musculoskeletal: Negative.  Negative for joint pain.  Skin: Negative.  Negative for rash.  Neurological: Negative.  Negative for sensory change, focal weakness, weakness and headaches.  Psychiatric/Behavioral: Negative.  The patient is not nervous/anxious.     As per HPI. Otherwise, a complete review of systems is negative.  PAST MEDICAL HISTORY: Past Medical History:  Diagnosis Date  . Acid reflux   . BP (high blood pressure) 10/02/2014  . BPH (benign prostatic hyperplasia)   . Elevated PSA   . Erectile dysfunction   . GERD (gastroesophageal reflux disease)   . Gross hematuria   . Hypertension   . Hypogonadism in male   . Kidney stone    bilateral  . Kidney stones 06/21/2015   . Microscopic hematuria   . Nephrolithiasis 2016  . Prostate cancer ([place])   . Renal stones 08/11/2015    PAST SURGICAL HISTORY: Past Surgical History:  Procedure Laterality Date  . EXTRACORPOREAL SHOCK WAVE LITHOTRIPSY Right 07/23/2015   Procedure: EXTRACORPOREAL SHOCK WAVE LITHOTRIPSY (ESWL);  Surgeon: Hollice Espy, MD;  Location: [facility] ORS;  Service: Urology;  Laterality: Right;  . EXTRACORPOREAL SHOCK WAVE LITHOTRIPSY Left 09/03/2015   Procedure: EXTRACORPOREAL SHOCK WAVE LITHOTRIPSY (ESWL);  Surgeon: Nickie Retort, MD;  Location: [facility] ORS;  Service: Urology;  Laterality: Left;  . extracorporeal shockwave lithotripsy  2016  . KIDNEY STONE SURGERY    . laser lithotripsy and stent placement  2005  . ORTHOPEDIC SURGERY    . PROSTATE SURGERY     seeds    FAMILY HISTORY: Family History  Problem Relation Age of Onset  . Hypertension Mother   . Cancer Maternal Grandfather   . Cancer Brother   . Prostate cancer Brother   . Kidney disease Neg Hx   . Bladder Cancer Neg Hx        ADVANCED DIRECTIVES:    HEALTH MAINTENANCE: Social History   Tobacco Use  . Smoking status: Never Smoker  . Smokeless tobacco: Never Used  Substance Use Topics  . Alcohol use: No    Alcohol/week: 0.0 standard drinks  . Drug use: No     Colonoscopy:  PAP:  Bone density:  Lipid panel:  Allergies  Allergen Reactions  . Other Nausea And Vomiting    Darvocet  . Oxycodone Nausea And Vomiting  . Propoxyphene Nausea And  Vomiting    Current Outpatient Medications  Medication Sig Dispense Refill  . benazepril (LOTENSIN) 5 MG tablet Take 5 mg by mouth 2 (two) times daily.     . calcium carbonate (CALCIUM 600) 600 MG TABS tablet Take by mouth.    . Cholecalciferol (VITAMIN D3) 2000 UNITS capsule Take 2,000 Units by mouth daily.     Marland Kitchen Fish Oil-Cholecalciferol (FISH OIL + D3 PO) Take 1,200 mg by mouth daily.     . Ginkgo Biloba 40 MG TABS Take 120 mg by mouth daily.     Marland Kitchen ibuprofen  (ADVIL,MOTRIN) 200 MG tablet Take 200 mg by mouth daily.    Marland Kitchen Lysine 500 MG CAPS Take 500 mg by mouth 2 (two) times daily.     Marland Kitchen omeprazole (PRILOSEC) 20 MG capsule Take by mouth.    . tamsulosin (FLOMAX) 0.4 MG CAPS capsule TAKE ONE (1) CAPSULE EACH DAY 90 capsule 3   No current facility-administered medications for this visit.     OBJECTIVE: Vitals:   01/17/19 1357  BP: (!) 169/70  Pulse: (!) 55  Temp: (!) 97.5 F (36.4 C)     Body mass index is 30.18 kg/m.    ECOG FS:0 - Asymptomatic  General: Well-developed, well-nourished, no acute distress. Eyes: Pink conjunctiva, anicteric sclera. HEENT: Normocephalic, moist mucous membranes. Lungs: Clear to auscultation bilaterally. Heart: Regular rate and rhythm. No rubs, murmurs, or gallops. Abdomen: Soft, nontender, nondistended. No organomegaly noted, normoactive bowel sounds. Musculoskeletal: No edema, cyanosis, or clubbing. Neuro: Alert, answering all questions appropriately. Cranial nerves grossly intact. Skin: No rashes or petechiae noted. Psych: Normal affect.  LAB RESULTS:  Lab Results  Component Value Date   NA 137 12/28/2017   K 5.4 (H) 01/11/2018   CL 103 12/28/2017   CO2 23 12/28/2017   GLUCOSE 149 (H) 12/28/2017   BUN 28 (H) 12/28/2017   CREATININE 1.20 12/28/2017   CALCIUM 9.5 12/28/2017   PROT 6.9 07/14/2016   ALBUMIN 4.1 07/14/2016   AST 25 07/14/2016   ALT 57 07/14/2016   ALKPHOS 91 07/14/2016   BILITOT 0.7 07/14/2016   GFRNONAA 57 (L) 12/28/2017   GFRAA 66 12/28/2017    Lab Results  Component Value Date   WBC 4.2 12/28/2017   NEUTROABS 1.9 07/14/2016   HGB 12.3 (L) 12/28/2017   HCT 37.2 (L) 12/28/2017   MCV 89 12/28/2017   PLT 207 12/28/2017   Lab Results  Component Value Date   PSA 0.05 07/14/2016     STUDIES: No results found.  ASSESSMENT: Prostate cancer, stage TIIa N0 M0 Gleason 4+3.  PLAN:    1. Prostate cancer:  Patient received radioactive seed implants in January 2015. He  also initiated androgen deprivation therapy approximately at the same time. He received his last dose of Lupron in March 2016.  Patient reports he had significant side effects of Lupron including extreme fatigue.  His PSA continues to remain undetectable at less than 0.1.  CT scan results from January 08, 2018 reviewed independently with no evidence of disease.  No intervention is needed at this time. Patient expressed understanding that if his PSA begins to increase, he may have to reinitiate Lupron.  Patient is being monitored by urology and primary care several times a year, therefore after lengthy discussion with the patient is agreed upon that no further follow-up is necessary in the cancer center.  Please refer patient back if his PSA begins to increase or there are any questions or concerns.  Continue  follow-up with urology as scheduled.   2. Hypertension: Patient's blood pressure remains persistently elevated.  Continue monitoring and treatment per primary care.   Patient expressed understanding and was in agreement with this plan. He also understands that He can call clinic at any time with any questions, concerns, or complaints.    Lloyd Huger, MD   01/18/2019 8:45 AM

## 2019-01-15 ENCOUNTER — Inpatient Hospital Stay: Payer: Medicare Other

## 2019-01-17 ENCOUNTER — Inpatient Hospital Stay: Payer: Medicare Other | Attending: Oncology | Admitting: Oncology

## 2019-01-17 ENCOUNTER — Ambulatory Visit: Payer: Medicare Other | Admitting: Oncology

## 2019-01-17 ENCOUNTER — Other Ambulatory Visit: Payer: Self-pay

## 2019-01-17 VITALS — BP 169/70 | HR 55 | Temp 97.5°F | Ht 69.0 in | Wt 204.4 lb

## 2019-01-17 DIAGNOSIS — K219 Gastro-esophageal reflux disease without esophagitis: Secondary | ICD-10-CM | POA: Insufficient documentation

## 2019-01-17 DIAGNOSIS — Z8249 Family history of ischemic heart disease and other diseases of the circulatory system: Secondary | ICD-10-CM | POA: Insufficient documentation

## 2019-01-17 DIAGNOSIS — Z791 Long term (current) use of non-steroidal anti-inflammatories (NSAID): Secondary | ICD-10-CM | POA: Insufficient documentation

## 2019-01-17 DIAGNOSIS — I10 Essential (primary) hypertension: Secondary | ICD-10-CM | POA: Diagnosis not present

## 2019-01-17 DIAGNOSIS — Z8042 Family history of malignant neoplasm of prostate: Secondary | ICD-10-CM | POA: Insufficient documentation

## 2019-01-17 DIAGNOSIS — Z79899 Other long term (current) drug therapy: Secondary | ICD-10-CM | POA: Insufficient documentation

## 2019-01-17 DIAGNOSIS — C61 Malignant neoplasm of prostate: Secondary | ICD-10-CM

## 2019-01-17 DIAGNOSIS — Z8546 Personal history of malignant neoplasm of prostate: Secondary | ICD-10-CM | POA: Diagnosis present

## 2019-01-17 NOTE — Progress Notes (Signed)
Patient is here today to follow up on his prostate cancer. Patient stated that he had been doing well with no complaints.

## 2019-07-06 ENCOUNTER — Encounter: Payer: Self-pay | Admitting: *Deleted

## 2019-07-09 ENCOUNTER — Other Ambulatory Visit: Payer: Self-pay

## 2019-07-09 ENCOUNTER — Other Ambulatory Visit: Payer: Medicare Other

## 2019-07-09 DIAGNOSIS — C61 Malignant neoplasm of prostate: Secondary | ICD-10-CM

## 2019-07-10 LAB — PSA: Prostate Specific Ag, Serum: <0.1 ng/mL (ref 0.0–4.0)

## 2019-07-11 ENCOUNTER — Ambulatory Visit: Payer: Medicare Other | Admitting: Urology

## 2019-07-22 NOTE — Progress Notes (Signed)
01/02/2019 1:57 PM   Alex Lang 02/16/1936 [id number]  Referring provider: Baxter Hire, MD [place],  [place] [address]  Chief Complaint  Patient presents with  . Prostate Cancer    HPI: Patient is a 83 year old Caucasian male with a history of prostate cancer and recurrent nephrolithiasis who presents today for six-month follow-up.    Prostate cancer Patient withcT2a Prostate cancer- Dx in 10/2014 with Gleason 3+4, 3+3 prostate cancer involving 8 of 13 cores, iPSA 4.8 DRE also somewhat concerning for cancer with questionable induration at right base. TRUS vol 45 cc. He underwent I-125 brachytherapy seed placement on 01/26/2015 by Dr. Hollice Espy. He also received adjunctive ADT therapy for 4-6 months. He is also being followed by the Cancer center and was last seen in 12/2017 by Dr Grayland Ormond.  CT scan in 12/2017 noted no evidence of metastatic disease and brachy seeds were visualized.  His most recent PSA on 07/212020 was <0.1 ng/mL.   I PSS score 2/1.  Previous I PSS score 1/0.  Currently on Flomax 0.4 mg daily.  Patient denies any gross hematuria, dysuria or suprapubic/flank pain.  Patient denies any fevers, chills, nausea or vomiting.   IPSS    Row Name 07/23/19 1300         International Prostate Symptom Score   How often have you had the sensation of not emptying your bladder?  Not at All     How often have you had to urinate less than every two hours?  Less than 1 in 5 times     How often have you found you stopped and started again several times when you urinated?  Not at All     How often have you found it difficult to postpone urination?  Less than 1 in 5 times     How often have you had a weak urinary stream?  Not at All     How often have you had to strain to start urination?  Not at All     How many times did you typically get up at night to urinate?  None     Total IPSS Score  2       Quality of Life due to urinary symptoms   If  you were to spend the rest of your life with your urinary condition just the way it is now how would you feel about that?  Pleased        Score:  1-7 Mild 8-19 Moderate 20-35 Severe   Recurrent nephrolithiasis CT scan from 01/08/2018 reveals small bilateral nonobstructing calculi but no ureteral stones, hydronephrosis, or ureteral obstruction.  There is also no evidence of metastatic disease from his prostate cancer and bradycardia seeds are notable.  He does have an incidental right inguinal hernia.  He is passing stones spontaneously without pain.  Patient experienced hyperkalemia with Urocit-K.  KUB on 07/02/2018 noted 0.7 cm nonobstructing stone left kidney. Additional small nonobstructing stones in the left kidney seen on CT scan are not visible on this examination. No other urinary tract stones are identified.  He has not passed any fragments and has not had any renal colic.   PMH: Past Medical History:  Diagnosis Date  . Acid reflux   . BP (high blood pressure) 10/02/2014  . BPH (benign prostatic hyperplasia)   . Elevated PSA   . Erectile dysfunction   . GERD (gastroesophageal reflux disease)   . Gross hematuria   . Hypertension   .  Hypogonadism in male   . Kidney stone    bilateral  . Kidney stones 06/21/2015  . Microscopic hematuria   . Nephrolithiasis 2016  . Prostate cancer ([place])   . Renal stones 08/11/2015    Surgical History: Past Surgical History:  Procedure Laterality Date  . EXTRACORPOREAL SHOCK WAVE LITHOTRIPSY Right 07/23/2015   Procedure: EXTRACORPOREAL SHOCK WAVE LITHOTRIPSY (ESWL);  Surgeon: Hollice Espy, MD;  Location: [facility] ORS;  Service: Urology;  Laterality: Right;  . EXTRACORPOREAL SHOCK WAVE LITHOTRIPSY Left 09/03/2015   Procedure: EXTRACORPOREAL SHOCK WAVE LITHOTRIPSY (ESWL);  Surgeon: Nickie Retort, MD;  Location: [facility] ORS;  Service: Urology;  Laterality: Left;  . extracorporeal shockwave lithotripsy  2016  . KIDNEY STONE SURGERY    . laser  lithotripsy and stent placement  2005  . ORTHOPEDIC SURGERY    . PROSTATE SURGERY     seeds    Home Medications:  Allergies as of 07/23/2019      Reactions   Other Nausea And Vomiting   Darvocet   Oxycodone Nausea And Vomiting   Propoxyphene Nausea And Vomiting      Medication List       Accurate as of July 23, 2019  1:57 PM. If you have any questions, ask your nurse or doctor.        benazepril 5 MG tablet Commonly known as: LOTENSIN Take 5 mg by mouth 2 (two) times daily.   Calcium 600 600 MG Tabs tablet Generic drug: calcium carbonate Take by mouth.   FISH OIL + D3 PO Take 1,200 mg by mouth daily.   Ginkgo Biloba 40 MG Tabs Take 120 mg by mouth daily.   ibuprofen 200 MG tablet Commonly known as: ADVIL Take 200 mg by mouth daily.   Lysine 500 MG Caps Take 500 mg by mouth 2 (two) times daily.   omeprazole 20 MG capsule Commonly known as: PRILOSEC Take by mouth.   tamsulosin 0.4 MG Caps capsule Commonly known as: FLOMAX TAKE ONE (1) CAPSULE EACH DAY   Vitamin D3 50 MCG (2000 UT) capsule Take 2,000 Units by mouth daily.       Allergies:  Allergies  Allergen Reactions  . Other Nausea And Vomiting    Darvocet  . Oxycodone Nausea And Vomiting  . Propoxyphene Nausea And Vomiting    Family History: Family History  Problem Relation Age of Onset  . Hypertension Mother   . Cancer Maternal Grandfather   . Cancer Brother   . Prostate cancer Brother   . Kidney disease Neg Hx   . Bladder Cancer Neg Hx     Social History:  reports that he has never smoked. He has never used smokeless tobacco. He reports that he does not drink alcohol or use drugs.  ROS: UROLOGY Frequent Urination?: No Hard to postpone urination?: No Burning/pain with urination?: No Get up at night to urinate?: No Leakage of urine?: No Urine stream starts and stops?: No Trouble starting stream?: No Do you have to strain to urinate?: No Blood in urine?: No Urinary tract  infection?: No Sexually transmitted disease?: No Injury to kidneys or bladder?: No Painful intercourse?: No Weak stream?: No Erection problems?: No Penile pain?: No  Gastrointestinal Nausea?: No Vomiting?: No Indigestion/heartburn?: No Diarrhea?: No Constipation?: No  Constitutional Fever: No Night sweats?: No Weight loss?: No Fatigue?: No  Skin Skin rash/lesions?: No Itching?: No  Eyes Blurred vision?: No Double vision?: No  Ears/Nose/Throat Sore throat?: No Sinus problems?: No  Hematologic/Lymphatic Swollen glands?: No  Easy bruising?: No  Cardiovascular Leg swelling?: No Chest pain?: No  Respiratory Cough?: No Shortness of breath?: No  Endocrine Excessive thirst?: No  Musculoskeletal Back pain?: No Joint pain?: No  Neurological Headaches?: No Dizziness?: No  Psychologic Depression?: No Anxiety?: No  Physical Exam: BP (!) 181/72 (BP Location: Left Arm, Patient Position: Sitting, Cuff Size: Normal)   Pulse (!) 59   Ht 5\' 9"  (1.753 m)   Wt 200 lb (90.7 kg)   BMI 29.53 kg/m   Constitutional:  Well nourished. Alert and oriented, No acute distress. HEENT: [place] AT, moist mucus membranes.  Trachea midline, no masses. Cardiovascular: No clubbing, cyanosis, or edema. Respiratory: Normal respiratory effort, no increased work of breathing. GI: Abdomen is soft, non tender, non distended, no abdominal masses. Liver and spleen not palpable.  No hernias appreciated.  Stool sample for occult testing is not indicated.   GU: No CVA tenderness.  No bladder fullness or masses.  Patient with circumcised phallus.   Urethral meatus is patent.  No penile discharge. No penile lesions or rashes. Scrotum without lesions, cysts, rashes and/or edema.  Testicles are located scrotally bilaterally. No masses are appreciated in the testicles. Left and right epididymis are normal. Rectal: Patient with  normal sphincter tone. Anus and perineum without scarring or rashes. No  rectal masses are appreciated. Prostate is approximately 35 grams, no nodules are appreciated. Seminal vesicles not palpated.  Skin: No rashes, bruises or suspicious lesions. Lymph: No inguinal adenopathy. Neurologic: Grossly intact, no focal deficits, moving all 4 extremities. Psychiatric: Normal mood and affect.   Laboratory Data:  Lab Results  Component Value Date   PSA1 <0.1 07/09/2019   PSA1 <0.1 12/26/2018   PSA1 <0.1 06/25/2018    Assessment & Plan:     1. Kidney stones Bilateral nonobstructing calculi, relatively small No indication for intervention at this time as he is asymptomatic Warning symptoms reviewed KUB on 07/02/2018 noted stable left renal stone - passed some stones spontaneously  Patient deferred KUB at this time as he is asymptomatic  Continue Flomax  2. Prostate cancer ([place]) PSA undetectable I PSS score today is 2/1, slightly worse  Scheduled for six-month for PSA, I PSS and exam    Zara Council, PA-C  [facility] [address] [place], [place] [address] [phone]

## 2019-07-23 ENCOUNTER — Other Ambulatory Visit: Payer: Self-pay

## 2019-07-23 ENCOUNTER — Encounter

## 2019-07-23 ENCOUNTER — Ambulatory Visit (INDEPENDENT_AMBULATORY_CARE_PROVIDER_SITE_OTHER): Payer: Medicare Other | Admitting: Urology

## 2019-07-23 ENCOUNTER — Encounter: Payer: Self-pay | Admitting: Urology

## 2019-07-23 VITALS — BP 181/72 | HR 59 | Ht 69.0 in | Wt 200.0 lb

## 2019-07-23 DIAGNOSIS — C61 Malignant neoplasm of prostate: Secondary | ICD-10-CM | POA: Diagnosis not present

## 2019-07-23 DIAGNOSIS — N200 Calculus of kidney: Secondary | ICD-10-CM

## 2019-07-23 MED ORDER — TAMSULOSIN HCL 0.4 MG PO CAPS: ORAL_CAPSULE | ORAL | 3 refills | Status: DC

## 2019-08-23 ENCOUNTER — Ambulatory Visit: Payer: Medicare Other | Admitting: Radiation Oncology

## 2019-09-12 ENCOUNTER — Other Ambulatory Visit: Payer: Self-pay

## 2019-09-13 ENCOUNTER — Ambulatory Visit
Admission: RE | Admit: 2019-09-13 | Discharge: 2019-09-13 | Disposition: A | Discharge status: Home / Self Care | Payer: Medicare Other | Source: Ambulatory Visit | Attending: Radiation Oncology | Admitting: Radiation Oncology

## 2019-09-13 ENCOUNTER — Encounter: Payer: Self-pay | Admitting: Radiation Oncology

## 2019-09-13 ENCOUNTER — Other Ambulatory Visit: Payer: Self-pay

## 2019-09-13 VITALS — BP 177/64 | HR 58 | Temp 96.6°F | Resp 16 | Wt 210.7 lb

## 2019-09-13 DIAGNOSIS — Z923 Personal history of irradiation: Secondary | ICD-10-CM | POA: Insufficient documentation

## 2019-09-13 DIAGNOSIS — C61 Malignant neoplasm of prostate: Secondary | ICD-10-CM | POA: Diagnosis not present

## 2019-09-13 NOTE — Progress Notes (Signed)
Radiation Oncology Follow up Note  Name: Alex Lang   Date:   09/13/2019 MRN:  [id number] DOB: [birth year]    This 83 y.o. male presents to the clinic today for 4-1/2-year follow-up status post I-125 interstitial implant for stage IIa adenocarcinoma the prostate.  REFERRING PROVIDER: Baxter Hire, MD  HPI: Patient is a 83 year old male now out over 4-1/2 years having completed I-125 interstitial implant for Gleason 6 adenocarcinoma the prostate presenting with a PSA of 4.8.  Seen today in routine follow-up he is doing well.  He specifically denies any increased lower urinary tract symptoms diarrhea or fatigue.  His most recent PSA is less than 0.1.Marland Kitchen  COMPLICATIONS OF TREATMENT: none  FOLLOW UP COMPLIANCE: keeps appointments   PHYSICAL EXAM:  BP (!) 177/64   Pulse (!) 58   Temp (!) 96.6 F (35.9 C) (Tympanic)   Resp 16   Wt 210 lb 11.2 oz (95.6 kg)   BMI 31.11 kg/m  Well-developed well-nourished patient in NAD. HEENT reveals PERLA, EOMI, discs not visualized.  Oral cavity is clear. No oral mucosal lesions are identified. Neck is clear without evidence of cervical or supraclavicular adenopathy. Lungs are clear to A&P. Cardiac examination is essentially unremarkable with regular rate and rhythm without murmur rub or thrill. Abdomen is benign with no organomegaly or masses noted. Motor sensory and DTR levels are equal and symmetric in the upper and lower extremities. Cranial nerves II through XII are grossly intact. Proprioception is intact. No peripheral adenopathy or edema is identified. No motor or sensory levels are noted. Crude visual fields are within normal range.  RADIOLOGY RESULTS: No current films for review  PLAN: Present time patient is doing well close to 5 years out with excellent biochemical control of his prostate cancer.  I am pleased with his overall progress.  He continues close follow-up care with urology and his PMD.  I will turn follow-up care over to them.   I will be happy to reevaluate the patient at any time should further radiation oncology consultation be indicated.  I would like to take this opportunity to thank you for allowing me to participate in the care of your patient.Noreene Filbert, MD

## 2020-01-13 ENCOUNTER — Ambulatory Visit: Payer: Self-pay | Admitting: General Surgery

## 2020-01-13 NOTE — H&P (View-Only) (Signed)
PATIENT PROFILE: Alex Lang is a 84 y.o. male who presents to the Clinic for consultation at the request of Dr. Johnston for evaluation of right inguinal hernia.  PCP:  Johnston, John David, MD  HISTORY OF PRESENT ILLNESS: [name] reports he felt a bulge in the right groin 2 weeks ago.  He was showering and he felt right groin bulge.  He described as discomfort.  No significant pain.  Denies any pain radiation.  He cannot identify any alleviating or aggravating factor.  He is trying to avoid heavy lifting since he saw the bulge.  He endorses that he is very active and do significant heavy lifting around the house.  He denies any chest pain or shortness of breath when he is active.  He can complete 4 METS without shortness of breath or chest pain.  Denies any abdominal distention.  Denies nausea or vomiting.  Reports tolerating diet and having regular bowel movement.   PROBLEM LIST:         Problem List  Date Reviewed: 01/02/2020         Noted   Subclinical hypothyroidism, unspecified 05/12/2017   Type 2 diabetes with nephropathy (CMS-HCC) 02/08/2017   Controlled type 2 diabetes mellitus without complication, without long-term current use of insulin (CMS-HCC) 10/12/2015   Anemia 04/10/2015   Prostate cancer (CMS-HCC) 04/10/2015   Overview    65 radiation seeds implanted 01/26/15 -- [facility]      Polio 04/10/2015   Overview    with weak atrophic right leg      Hypertension 10/02/2014   Hyperlipidemia with low HDL 10/02/2014      GENERAL REVIEW OF SYSTEMS:   General ROS: negative for - chills, fatigue, fever, weight gain or weight loss Allergy and Immunology ROS: negative for - hives  Hematological and Lymphatic ROS: negative for - bleeding problems or bruising, negative for palpable nodes Endocrine ROS: negative for - heat or cold intolerance, hair changes Respiratory ROS: negative for - cough, shortness of breath or  wheezing Cardiovascular ROS: no chest pain or palpitations GI ROS: negative for nausea, vomiting, abdominal pain, diarrhea, constipation Musculoskeletal ROS: negative for - joint swelling or muscle pain Neurological ROS: negative for - confusion, syncope Dermatological ROS: negative for pruritus and rash Psychiatric: negative for anxiety, depression, difficulty sleeping and memory loss  MEDICATIONS: Current Medications        Current Outpatient Medications  Medication Sig Dispense Refill  . benazepriL (LOTENSIN) 10 MG tablet TAKE 1 TABLET BY MOUTH TWICE A DAY 180 tablet 1  . CALCIUM CARBONATE (CALCIUM 600 ORAL) Take 1,200 mg by mouth once daily.    . cholecalciferol (VITAMIN D3) 2,000 unit capsule Take 2,000 Units by mouth once daily.    . ginkgo biloba 40 mg Tab Take 1 tablet by mouth once daily.     . lysine 500 mg Cap Take 1 tablet by mouth once daily.     . OMEGA-3 FATTY ACIDS/FISH OIL (FISH OIL EXTRA STRENGTH ORAL) Take 1 capsule by mouth once daily.     . omeprazole (PRILOSEC) 20 MG DR capsule TAKE 1 CAPSULE BY MOUTH TWICE A DAY 180 capsule 1  . tamsulosin (FLOMAX) 0.4 mg capsule TAKE ONE CAPSULE BY MOUTH DAILY     No current facility-administered medications for this visit.       ALLERGIES: Amoxicillin, Darvocet a500 [propoxyphene n-acetaminophen], and Oxycodone  PAST MEDICAL HISTORY:     Past Medical History:  Diagnosis Date  . Arthritis in knees  .   Bursitis of right hip   . Controlled type 2 diabetes mellitus without complication, without long-term current use of insulin (CMS-HCC)   . GERD (gastroesophageal reflux disease)   . Hiatal hernia   . History of chicken pox   . Hyperglycemia   . Hyperlipidemia with low HDL   . Hypertension   . Hypogonadism in male   . Nephrolithiasis   . Polio    with weak atrophic right leg  . Prostate cancer (CMS-HCC)    65 radiation seeds implanted 01/26/15 -- [facility]  . Subclinical  hypothyroidism, unspecified   . Type 2 diabetes with nephropathy (CMS-HCC)     PAST SURGICAL HISTORY:      Past Surgical History:  Procedure Laterality Date  . COLONOSCOPY  04/29/05  . ENDOSCOPIC CARPAL TUNNEL RELEASE Left   . FRACTURE SURGERY  1980?  . KNEE ARTHROSCOPY Left 01/19/07   Left knee, partial medial meniscus and chondroplasty of medial compartment.  . PROSTATE SURGERY  64 radiation seeds   implanted 2016  . Surgery of right leg due to Polio Right 1948     FAMILY HISTORY:      Family History  Problem Relation Age of Onset  . High blood pressure (Hypertension) Mother   . Arthritis Mother        rhuematoid  . Osteoarthritis Mother   . Lung disease Father   . Cirrhosis Father   . Alcohol abuse Father   . Myocardial Infarction (Heart attack) Brother   . Prostate cancer Brother   . Pancreatic cancer Brother      SOCIAL HISTORY: Social History          Socioeconomic History  . Marital status: Married    Spouse name: Not on file  . Number of children: Not on file  . Years of education: Not on file  . Highest education level: Not on file  Occupational History  . Not on file  Social Needs  . Financial resource strain: Not on file  . Food insecurity    Worry: Not on file    Inability: Not on file  . Transportation needs    Medical: Not on file    Non-medical: Not on file  Tobacco Use  . Smoking status: Never Smoker  . Smokeless tobacco: Never Used  . Tobacco comment: Never smoked  Substance and Sexual Activity  . Alcohol use: No    Alcohol/week: 0.0 standard drinks  . Drug use: No  . Sexual activity: Yes    Partners: Female    Birth control/protection: None    Comment: Wife  Other Topics Concern  . Not on file  Social History Narrative  . Not on file      PHYSICAL EXAM:    Vitals:   01/13/20 0921  BP: 171/74  Pulse: 60   Body mass index is 31.16 kg/m. Weight: 95.7 kg (211 lb)   GENERAL:  Alert, active, oriented x3  HEENT: Pupils equal reactive to light. Extraocular movements are intact. Sclera clear. Palpebral conjunctiva normal red color.Pharynx clear.  NECK: Supple with no palpable mass and no adenopathy.  LUNGS: Sound clear with no rales rhonchi or wheezes.  HEART: Regular rhythm S1 and S2 without murmur.  ABDOMEN: Soft and depressible, nontender with no palpable mass, no hepatomegaly.  Right groin soft hernia, reducible.  No hernia identified on the left groin.  EXTREMITIES: Well-developed well-nourished symmetrical with no dependent edema.  NEUROLOGICAL: Awake alert oriented, facial expression symmetrical, moving all extremities.  REVIEW OF   DATA: I have reviewed the following data today:      Appointment on 12/26/2019  Component Date Value  . Glucose 12/26/2019 192*  . Sodium 12/26/2019 136   . Potassium 12/26/2019 4.8   . Chloride 12/26/2019 104   . Carbon Dioxide (CO2) 12/26/2019 28.4   . Urea Nitrogen (BUN) 12/26/2019 31*  . Creatinine 12/26/2019 1.4*  . Glomerular Filtration Ra* 12/26/2019 49*  . Calcium 12/26/2019 9.8   . AST  12/26/2019 14   . ALT  12/26/2019 34   . Alk Phos (alkaline Phosp* 12/26/2019 63   . Albumin 12/26/2019 4.2   . Bilirubin, Total 12/26/2019 0.5   . Protein, Total 12/26/2019 6.7   . A/G Ratio 12/26/2019 1.7   . WBC (White Blood Cell Co* 12/26/2019 4.5   . RBC (Red Blood Cell Coun* 12/26/2019 4.05*  . Hemoglobin 12/26/2019 12.1*  . Hematocrit 12/26/2019 34.8*  . MCV (Mean Corpuscular Vo* 12/26/2019 85.9   . MCH (Mean Corpuscular He* 12/26/2019 29.9   . MCHC (Mean Corpuscular H* 12/26/2019 34.8   . Platelet Count 12/26/2019 189   . RDW-CV (Red Cell Distrib* 12/26/2019 12.2   . MPV (Mean Platelet Volum* 12/26/2019 10.6   . Neutrophils 12/26/2019 2.00   . Lymphocytes 12/26/2019 1.61   . Monocytes 12/26/2019 0.57   . Eosinophils 12/26/2019 0.23   . Basophils 12/26/2019 0.03   . Neutrophil % 12/26/2019 44.9   .  Lymphocyte % 12/26/2019 36.2   . Monocyte % 12/26/2019 12.8   . Eosinophil % 12/26/2019 5.2*  . Basophil% 12/26/2019 0.7   . Immature Granulocyte % 12/26/2019 0.2   . Immature Granulocyte Cou* 12/26/2019 0.01   . Cholesterol, Total 12/26/2019 169   . Triglyceride 12/26/2019 253*  . HDL (High Density Lipopr* 12/26/2019 27.2*  . LDL Calculated 12/26/2019 91   . VLDL Cholesterol 12/26/2019 51   . Cholesterol/HDL Ratio 12/26/2019 6.2   . Color 12/26/2019 Yellow   . Clarity 12/26/2019 Clear   . Specific Gravity 12/26/2019 1.025   . pH, Urine 12/26/2019 5.0   . Protein, Urinalysis 12/26/2019 Negative   . Glucose, Urinalysis 12/26/2019 Negative   . Ketones, Urinalysis 12/26/2019 Negative   . Blood, Urinalysis 12/26/2019 Negative   . Nitrite, Urinalysis 12/26/2019 Negative   . Leukocyte Esterase, Urin* 12/26/2019 Negative   . White Blood Cells, Urina* 12/26/2019 None Seen   . Red Blood Cells, Urinaly* 12/26/2019 None Seen   . Bacteria, Urinalysis 12/26/2019 None Seen   . Squamous Epithelial Cell* 12/26/2019 Rare   . Hemoglobin A1C 12/26/2019 8.6*  . Average Blood Glucose (C* 12/26/2019 200      ASSESSMENT: Mr. Hickle is a 84 y.o. male presenting for consultation for right inguinal hernia.    The patient presents with a symptomatic, reducible inguinal hernia. Patient was oriented about the diagnosis of inguinal hernia and its implication. The patient was oriented about the treatment alternatives (observation vs surgical repair). Due to patient symptoms, repair is recommended. Patient oriented about the surgical procedure, the use of mesh and its risk of complications such as: infection, bleeding, injury to vas deference, vasculature and testicle, injury to bowel or bladder, and chronic pain.  Patient oriented about the possibility of having a concurrent contralateral inguinal hernia.  If identified Intra-Op patient agreed to proceed with repair.  Patient also oriented that he  surgery might be more difficult than usual due to scar tissue due to prostate radiation.  He understood and agreed to proceed with surgical management of  inguinal hernia repair.  Non-recurrent unilateral inguinal hernia without obstruction or gangrene [K40.90]  PLAN: 1. Robotic assisted laparoscopic right vs bilateral inguinal hernia repair with mesh (40981) 2.  CBC, CMP (done 12/26/2019) 3.  Avoid taking aspirin 5 days before procedure 4.  Internal Medicine Clearance (Dr. Edwina Barth) 5.  Contact us if has any question or concern.  Patient and his wife verbalized understanding, all questions were answered, and were agreeable with the plan outlined above.   I spent a total of 60 minutes in both face-to-face and non-face-to-face activities for this visit on the date of this encounter.  Herbert Pun, MD  Electronically signed by Herbert Pun, MD

## 2020-01-13 NOTE — H&P (Signed)
PATIENT PROFILE: Alex Lang is a 84 y.o. male who presents to the Clinic for consultation at the request of Dr. Edwina Barth for evaluation of right inguinal hernia.  PCP:  Velna Ochs, MD  HISTORY OF PRESENT ILLNESS: Alex Lang reports he felt a bulge in the right groin 2 weeks ago.  He was showering and he felt right groin bulge.  He described as discomfort.  No significant pain.  Denies any pain radiation.  He cannot identify any alleviating or aggravating factor.  He is trying to avoid heavy lifting since he saw the bulge.  He endorses that he is very active and do significant heavy lifting around the house.  He denies any chest pain or shortness of breath when he is active.  He can complete 4 METS without shortness of breath or chest pain.  Denies any abdominal distention.  Denies nausea or vomiting.  Reports tolerating diet and having regular bowel movement.   PROBLEM LIST:         Problem List  Date Reviewed: 01/02/2020         Noted   Subclinical hypothyroidism, unspecified 05/12/2017   Type 2 diabetes with nephropathy (CMS-HCC) 02/08/2017   Controlled type 2 diabetes mellitus without complication, without long-term current use of insulin (CMS-HCC) 10/12/2015   Anemia 04/10/2015   Prostate cancer (CMS-HCC) 04/10/2015   Overview    65 radiation seeds implanted 01/26/15 -- [facility]      Polio 04/10/2015   Overview    with weak atrophic right leg      Hypertension 10/02/2014   Hyperlipidemia with low HDL 10/02/2014      GENERAL REVIEW OF SYSTEMS:   General ROS: negative for - chills, fatigue, fever, weight gain or weight loss Allergy and Immunology ROS: negative for - hives  Hematological and Lymphatic ROS: negative for - bleeding problems or bruising, negative for palpable nodes Endocrine ROS: negative for - heat or cold intolerance, hair changes Respiratory ROS: negative for - cough, shortness of breath or  wheezing Cardiovascular ROS: no chest pain or palpitations GI ROS: negative for nausea, vomiting, abdominal pain, diarrhea, constipation Musculoskeletal ROS: negative for - joint swelling or muscle pain Neurological ROS: negative for - confusion, syncope Dermatological ROS: negative for pruritus and rash Psychiatric: negative for anxiety, depression, difficulty sleeping and memory loss  MEDICATIONS: Current Medications        Current Outpatient Medications  Medication Sig Dispense Refill  . benazepriL (LOTENSIN) 10 MG tablet TAKE 1 TABLET BY MOUTH TWICE A DAY 180 tablet 1  . CALCIUM CARBONATE (CALCIUM 600 ORAL) Take 1,200 mg by mouth once daily.    . cholecalciferol (VITAMIN D3) 2,000 unit capsule Take 2,000 Units by mouth once daily.    Marland Kitchen ginkgo biloba 40 mg Tab Take 1 tablet by mouth once daily.     Marland Kitchen lysine 500 mg Cap Take 1 tablet by mouth once daily.     . OMEGA-3 FATTY ACIDS/FISH OIL (FISH OIL EXTRA STRENGTH ORAL) Take 1 capsule by mouth once daily.     Marland Kitchen omeprazole (PRILOSEC) 20 MG DR capsule TAKE 1 CAPSULE BY MOUTH TWICE A DAY 180 capsule 1  . tamsulosin (FLOMAX) 0.4 mg capsule TAKE ONE CAPSULE BY MOUTH DAILY     No current facility-administered medications for this visit.       ALLERGIES: Amoxicillin, Darvocet a500 [propoxyphene n-acetaminophen], and Oxycodone  PAST MEDICAL HISTORY:     Past Medical History:  Diagnosis Date  . Arthritis in knees  .  Bursitis of right hip   . Controlled type 2 diabetes mellitus without complication, without long-term current use of insulin (CMS-HCC)   . GERD (gastroesophageal reflux disease)   . Hiatal hernia   . History of chicken pox   . Hyperglycemia   . Hyperlipidemia with low HDL   . Hypertension   . Hypogonadism in male   . Nephrolithiasis   . Polio    with weak atrophic right leg  . Prostate cancer (CMS-HCC)    60 radiation seeds implanted 01/26/15 -- [facility]  . Subclinical  hypothyroidism, unspecified   . Type 2 diabetes with nephropathy (CMS-HCC)     PAST SURGICAL HISTORY:      Past Surgical History:  Procedure Laterality Date  . COLONOSCOPY  04/29/05  . ENDOSCOPIC CARPAL TUNNEL RELEASE Left   . FRACTURE SURGERY  1980?  Marland Kitchen KNEE ARTHROSCOPY Left 01/19/07   Left knee, partial medial meniscus and chondroplasty of medial compartment.  Marland Kitchen PROSTATE SURGERY  64 radiation seeds   implanted 2016  . Surgery of right leg due to Polio Right 1948     FAMILY HISTORY:      Family History  Problem Relation Age of Onset  . High blood pressure (Hypertension) Mother   . Arthritis Mother        rhuematoid  . Osteoarthritis Mother   . Lung disease Father   . Cirrhosis Father   . Alcohol abuse Father   . Myocardial Infarction (Heart attack) Brother   . Prostate cancer Brother   . Pancreatic cancer Brother      SOCIAL HISTORY: Social History          Socioeconomic History  . Marital status: Married    Spouse name: Not on file  . Number of children: Not on file  . Years of education: Not on file  . Highest education level: Not on file  Occupational History  . Not on file  Social Needs  . Financial resource strain: Not on file  . Food insecurity    Worry: Not on file    Inability: Not on file  . Transportation needs    Medical: Not on file    Non-medical: Not on file  Tobacco Use  . Smoking status: Never Smoker  . Smokeless tobacco: Never Used  . Tobacco comment: Never smoked  Substance and Sexual Activity  . Alcohol use: No    Alcohol/week: 0.0 standard drinks  . Drug use: No  . Sexual activity: Yes    Partners: Female    Birth control/protection: None    Comment: Wife  Other Topics Concern  . Not on file  Social History Narrative  . Not on file      PHYSICAL EXAM:    Vitals:   01/13/20 0921  BP: 171/74  Pulse: 60   Body mass index is 31.16 kg/m. Weight: 95.7 kg (211 lb)   GENERAL:  Alert, active, oriented x3  HEENT: Pupils equal reactive to light. Extraocular movements are intact. Sclera clear. Palpebral conjunctiva normal red color.Pharynx clear.  NECK: Supple with no palpable mass and no adenopathy.  LUNGS: Sound clear with no rales rhonchi or wheezes.  HEART: Regular rhythm S1 and S2 without murmur.  ABDOMEN: Soft and depressible, nontender with no palpable mass, no hepatomegaly.  Right groin soft hernia, reducible.  No hernia identified on the left groin.  EXTREMITIES: Well-developed well-nourished symmetrical with no dependent edema.  NEUROLOGICAL: Awake alert oriented, facial expression symmetrical, moving all extremities.  REVIEW OF  DATA: I have reviewed the following data today:      Appointment on 12/26/2019  Component Date Value  . Glucose 12/26/2019 192*  . Sodium 12/26/2019 136   . Potassium 12/26/2019 4.8   . Chloride 12/26/2019 104   . Carbon Dioxide (CO2) 12/26/2019 28.4   . Urea Nitrogen (BUN) 12/26/2019 31*  . Creatinine 12/26/2019 1.4*  . Glomerular Filtration Ra* 12/26/2019 49*  . Calcium 12/26/2019 9.8   . AST  12/26/2019 14   . ALT  12/26/2019 34   . Alk Phos (alkaline Phosp* 12/26/2019 63   . Albumin 12/26/2019 4.2   . Bilirubin, Total 12/26/2019 0.5   . Protein, Total 12/26/2019 6.7   . A/G Ratio 12/26/2019 1.7   . WBC (White Blood Cell Co* 12/26/2019 4.5   . RBC (Red Blood Cell Coun* 12/26/2019 4.05*  . Hemoglobin 12/26/2019 12.1*  . Hematocrit 12/26/2019 34.8*  . MCV (Mean Corpuscular Vo* 12/26/2019 85.9   . MCH (Mean Corpuscular He* 12/26/2019 29.9   . MCHC (Mean Corpuscular H* 12/26/2019 34.8   . Platelet Count 12/26/2019 189   . RDW-CV (Red Cell Distrib* 12/26/2019 12.2   . MPV (Mean Platelet Volum* 12/26/2019 10.6   . Neutrophils 12/26/2019 2.00   . Lymphocytes 12/26/2019 1.61   . Monocytes 12/26/2019 0.57   . Eosinophils 12/26/2019 0.23   . Basophils 12/26/2019 0.03   . Neutrophil % 12/26/2019 44.9   .  Lymphocyte % 12/26/2019 36.2   . Monocyte % 12/26/2019 12.8   . Eosinophil % 12/26/2019 5.2*  . Basophil% 12/26/2019 0.7   . Immature Granulocyte % 12/26/2019 0.2   . Immature Granulocyte Cou* 12/26/2019 0.01   . Cholesterol, Total 12/26/2019 169   . Triglyceride 12/26/2019 253*  . HDL (High Density Lipopr* 12/26/2019 27.2*  . LDL Calculated 12/26/2019 91   . VLDL Cholesterol 12/26/2019 51   . Cholesterol/HDL Ratio 12/26/2019 6.2   . Color 12/26/2019 Yellow   . Clarity 12/26/2019 Clear   . Specific Gravity 12/26/2019 1.025   . pH, Urine 12/26/2019 5.0   . Protein, Urinalysis 12/26/2019 Negative   . Glucose, Urinalysis 12/26/2019 Negative   . Ketones, Urinalysis 12/26/2019 Negative   . Blood, Urinalysis 12/26/2019 Negative   . Nitrite, Urinalysis 12/26/2019 Negative   . Leukocyte Esterase, Urin* 12/26/2019 Negative   . White Blood Cells, Urina* 12/26/2019 None Seen   . Red Blood Cells, Urinaly* 12/26/2019 None Seen   . Bacteria, Urinalysis 12/26/2019 None Seen   . Squamous Epithelial Cell* 12/26/2019 Rare   . Hemoglobin A1C 12/26/2019 8.6*  . Average Blood Glucose (C* 12/26/2019 200      ASSESSMENT: Alex Lang is a 84 y.o. male presenting for consultation for right inguinal hernia.    The patient presents with a symptomatic, reducible inguinal hernia. Patient was oriented about the diagnosis of inguinal hernia and its implication. The patient was oriented about the treatment alternatives (observation vs surgical repair). Due to patient symptoms, repair is recommended. Patient oriented about the surgical procedure, the use of mesh and its risk of complications such as: infection, bleeding, injury to vas deference, vasculature and testicle, injury to bowel or bladder, and chronic pain.  Patient oriented about the possibility of having a concurrent contralateral inguinal hernia.  If identified Intra-Op patient agreed to proceed with repair.  Patient also oriented that he  surgery might be more difficult than usual due to scar tissue due to prostate radiation.  He understood and agreed to proceed with surgical management of  inguinal hernia repair.  Non-recurrent unilateral inguinal hernia without obstruction or gangrene [K40.90]  PLAN: 1. Robotic assisted laparoscopic right vs bilateral inguinal hernia repair with mesh (40981) 2.  CBC, CMP (done 12/26/2019) 3.  Avoid taking aspirin 5 days before procedure 4.  Internal Medicine Clearance (Dr. Edwina Barth) 5.  Contact us if has any question or concern.  Patient and his wife verbalized understanding, all questions were answered, and were agreeable with the plan outlined above.   I spent a total of 60 minutes in both face-to-face and non-face-to-face activities for this visit on the date of this encounter.  Herbert Pun, MD  Electronically signed by Herbert Pun, MD

## 2020-01-15 ENCOUNTER — Other Ambulatory Visit: Payer: Self-pay

## 2020-01-15 DIAGNOSIS — C61 Malignant neoplasm of prostate: Secondary | ICD-10-CM

## 2020-01-16 ENCOUNTER — Other Ambulatory Visit: Payer: Self-pay

## 2020-01-16 ENCOUNTER — Other Ambulatory Visit: Payer: Medicare Other

## 2020-01-16 DIAGNOSIS — C61 Malignant neoplasm of prostate: Secondary | ICD-10-CM

## 2020-01-17 LAB — PSA: Prostate Specific Ag, Serum: <0.1 ng/mL (ref 0.0–4.0)

## 2020-01-23 ENCOUNTER — Ambulatory Visit (INDEPENDENT_AMBULATORY_CARE_PROVIDER_SITE_OTHER): Payer: Medicare Other | Admitting: Urology

## 2020-01-23 ENCOUNTER — Encounter: Payer: Self-pay | Admitting: Urology

## 2020-01-23 ENCOUNTER — Other Ambulatory Visit: Payer: Self-pay

## 2020-01-23 ENCOUNTER — Other Ambulatory Visit: Payer: Self-pay | Admitting: Family Medicine

## 2020-01-23 VITALS — BP 198/99 | HR 65 | Ht 69.0 in | Wt 210.0 lb

## 2020-01-23 DIAGNOSIS — C61 Malignant neoplasm of prostate: Secondary | ICD-10-CM | POA: Diagnosis not present

## 2020-01-23 DIAGNOSIS — N451 Epididymitis: Secondary | ICD-10-CM

## 2020-01-23 DIAGNOSIS — N200 Calculus of kidney: Secondary | ICD-10-CM

## 2020-01-23 MED ORDER — SULFAMETHOXAZOLE-TRIMETHOPRIM 800-160 MG PO TABS: 1.0000 | ORAL_TABLET | Freq: Two times a day (BID) | ORAL | 0 refills | Status: DC

## 2020-01-23 NOTE — Progress Notes (Signed)
01/02/2019 9:14 PM   [name] July 24, 1936 [id number]  Referring provider: Baxter Hire, MD [place],  [place] [address]  Chief Complaint  Patient presents with  . Prostate Cancer    HPI: Patient is a 84 year old male with a history of prostate cancer and recurrent nephrolithiasis who presents today for six-month follow-up with his wife, Alex Lang.   Prostate cancer Patient withcT2a Prostate cancer- Dx in 10/2014 with Gleason 3+4, 3+3 prostate cancer involving 8 of 13 cores, iPSA 4.8 DRE also somewhat concerning for cancer with questionable induration at right base. TRUS vol 45 cc. He underwent I-125 brachytherapy seed placement on 01/26/2015 by Dr. Hollice Espy.  He also received adjunctive ADT therapy for 4-6 months.  CT scan in 12/2017 noted no evidence of metastatic disease and brachy seeds were visualized.    He was released from the Cancer center on 01/17/2019 by Dr. Grayland Ormond.  His most recent PSA on 01/16/2020 was <0.1 ng/mL.     I PSS score 0/0.  Previous I PSS score 2/1.  Currently on Flomax 0.4 mg daily. Patient denies any modifying or aggravating factors.  Patient denies any gross hematuria, dysuria or suprapubic/flank pain.  Patient denies any fevers, chills, nausea or vomiting.   IPSS    Row Name 01/23/20 0800         International Prostate Symptom Score   How often have you had the sensation of not emptying your bladder?  Not at All     How often have you had to urinate less than every two hours?  Not at All     How often have you found you stopped and started again several times when you urinated?  Not at All     How often have you found it difficult to postpone urination?  Not at All     How often have you had a weak urinary stream?  Not at All     How often have you had to strain to start urination?  Not at All     How many times did you typically get up at night to urinate?  None     Total IPSS Score  0       Quality of Life  due to urinary symptoms   If you were to spend the rest of your life with your urinary condition just the way it is now how would you feel about that?  Delighted        Score:  1-7 Mild 8-19 Moderate 20-35 Severe   Recurrent nephrolithiasis CT scan from 01/08/2018 reveals small bilateral nonobstructing calculi but no ureteral stones, hydronephrosis, or ureteral obstruction.  There is also no evidence of metastatic disease from his prostate cancer and bradycardia seeds are notable.  He does have an incidental right inguinal hernia.  Patient experienced hyperkalemia with Urocit-K.  KUB on 07/02/2018 noted 0.7 cm nonobstructing stone left kidney. Additional small nonobstructing stones in the left kidney seen on CT scan are not visible on this examination. No other urinary tract stones are identified.  He has not passed any fragments, experienced any flank pain or gross hematuria.   He has had some pain in his left testicle and he has an upcoming hernia surgery.    PMH: Past Medical History:  Diagnosis Date  . Acid reflux   . BP (high blood pressure) 10/02/2014  . BPH (benign prostatic hyperplasia)   . Elevated PSA   . Erectile dysfunction   .  GERD (gastroesophageal reflux disease)   . Gross hematuria   . History of hiatal hernia   . History of kidney stones   . Hypertension   . Hypogonadism in male   . Kidney stone    bilateral  . Kidney stones 06/21/2015  . Microscopic hematuria   . Nephrolithiasis 2016  . Pneumonia   . PONV (postoperative nausea and vomiting)   . Pre-diabetes   . Prostate cancer ([place])   . Renal stones 08/11/2015    Surgical History: Past Surgical History:  Procedure Laterality Date  . EXTRACORPOREAL SHOCK WAVE LITHOTRIPSY Right 07/23/2015   Procedure: EXTRACORPOREAL SHOCK WAVE LITHOTRIPSY (ESWL);  Surgeon: Hollice Espy, MD;  Location: [facility] ORS;  Service: Urology;  Laterality: Right;  . EXTRACORPOREAL SHOCK WAVE LITHOTRIPSY Left 09/03/2015   Procedure:  EXTRACORPOREAL SHOCK WAVE LITHOTRIPSY (ESWL);  Surgeon: Nickie Retort, MD;  Location: [facility] ORS;  Service: Urology;  Laterality: Left;  . extracorporeal shockwave lithotripsy  2016  . KIDNEY STONE SURGERY    . laser lithotripsy and stent placement  2005  . ORTHOPEDIC SURGERY Left    torn meniscus  . PROSTATE SURGERY     seeds  . XI ROBOTIC ASSISTED INGUINAL HERNIA REPAIR WITH MESH Bilateral 02/05/2020   Procedure: XI ROBOTIC ASSISTED INGUINAL HERNIA REPAIR WITH MESH;  Surgeon: Herbert Pun, MD;  Location: [facility] ORS;  Service: General;  Laterality: Bilateral;    Home Medications:  Allergies as of 01/23/2020      Reactions   Other Nausea And Vomiting   Darvocet   Oxycodone Nausea And Vomiting   Propoxyphene Nausea And Vomiting      Medication List       Accurate as of January 23, 2020 11:59 PM. If you have any questions, ask your nurse or doctor.        acetaminophen-codeine 300-30 MG tablet Commonly known as: TYLENOL #3 Take 1 tablet by mouth every 4 (four) hours as needed for moderate pain.   benazepril 5 MG tablet Commonly known as: LOTENSIN Take 5 mg by mouth 2 (two) times daily.   Calcium 600 600 MG Tabs tablet Generic drug: calcium carbonate Take 600 mg by mouth daily.   FISH OIL + D3 PO Take 1,200 mg by mouth daily.   Ginkgo Biloba 40 MG Tabs Take 120 mg by mouth daily.   ibuprofen 200 MG tablet Commonly known as: ADVIL Take 200-400 mg by mouth every 4 (four) hours as needed for mild pain or moderate pain.   Lysine 1000 MG Tabs Take 1,000 mg by mouth daily.   multivitamin with minerals tablet Take 1 tablet by mouth daily. Centrum   omeprazole 20 MG capsule Commonly known as: PRILOSEC Take 20 mg by mouth 2 (two) times daily.   polyethylene glycol 17 g packet Commonly known as: MIRALAX / GLYCOLAX Take 17 g by mouth every Monday, Wednesday, and Friday.   sulfamethoxazole-trimethoprim 800-160 MG tablet Commonly known as: BACTRIM DS Take 1  tablet by mouth every 12 (twelve) hours. Started by: Zara Council, PA-C   tamsulosin 0.4 MG Caps capsule Commonly known as: FLOMAX TAKE ONE (1) CAPSULE EACH DAY What changed:   how much to take  how to take this  when to take this  additional instructions   Vitamin D3 125 MCG (5000 UT) Tabs Take 5,000 Units by mouth daily.       Allergies:  Allergies  Allergen Reactions  . Other Nausea And Vomiting    Darvocet  . Oxycodone Nausea And  Vomiting  . Propoxyphene Nausea And Vomiting    Family History: Family History  Problem Relation Age of Onset  . Hypertension Mother   . Cancer Maternal Grandfather   . Cancer Brother   . Prostate cancer Brother   . Kidney disease Neg Hx   . Bladder Cancer Neg Hx     Social History:  reports that he has never smoked. He has never used smokeless tobacco. He reports that he does not drink alcohol or use drugs.  ROS: UROLOGY Frequent Urination?: No Hard to postpone urination?: No Burning/pain with urination?: No Get up at night to urinate?: No Leakage of urine?: No Urine stream starts and stops?: No Trouble starting stream?: No Do you have to strain to urinate?: No Blood in urine?: No Urinary tract infection?: No Sexually transmitted disease?: No Injury to kidneys or bladder?: No Painful intercourse?: No Weak stream?: No Erection problems?: No Penile pain?: No  Gastrointestinal Nausea?: No Vomiting?: No Indigestion/heartburn?: No Diarrhea?: No Constipation?: No  Constitutional Fever: No Night sweats?: No Weight loss?: No Fatigue?: No  Skin Skin rash/lesions?: No Itching?: No  Eyes Blurred vision?: No Double vision?: No  Ears/Nose/Throat Sore throat?: No Sinus problems?: No  Hematologic/Lymphatic Swollen glands?: No Easy bruising?: No  Cardiovascular Leg swelling?: No Chest pain?: No  Respiratory Cough?: No Shortness of breath?: No  Endocrine Excessive thirst?: No  Musculoskeletal Back  pain?: No Joint pain?: No  Neurological Headaches?: No Dizziness?: No  Psychologic Depression?: No Anxiety?: No  Physical Exam: BP (!) 198/99   Pulse 65   Ht 5\' 9"  (1.753 m)   Wt 210 lb (95.3 kg)   BMI 31.01 kg/m   Constitutional:  Well nourished. Alert and oriented, No acute distress. HEENT: [place] AT, mask in place.  Trachea midline, no masses. Cardiovascular: No clubbing, cyanosis, or edema. Respiratory: Normal respiratory effort, no increased work of breathing. GI: Abdomen is soft, non tender, non distended, no abdominal masses. Liver and spleen not palpable.  No hernias appreciated.  Stool sample for occult testing is not indicated.   GU: No CVA tenderness.  No bladder fullness or masses.  Patient with circumcised phallus.  Urethral meatus is patent.  No penile discharge. No penile lesions or rashes. Scrotum without lesions, cysts, rashes and/or edema.  Testicles are located scrotally bilaterally. No masses are appreciated in the testicles. Right epididymis are normal.  Left epididymis is slightly tender.   Rectal: Patient with  normal sphincter tone. Anus and perineum without scarring or rashes. No rectal masses are appreciated. Prostate is approximately 30 grams, no nodules are appreciated. Seminal vesicles could not be palpated Skin: No rashes, bruises or suspicious lesions. Lymph: No inguinal adenopathy. Neurologic: Grossly intact, no focal deficits, moving all 4 extremities. Psychiatric: Normal mood and affect.   Laboratory Data:  Lab Results  Component Value Date   PSA1 <0.1 01/16/2020   PSA1 <0.1 07/09/2019   PSA1 <0.1 12/26/2018    Assessment & Plan:     1. Kidney stones Bilateral non obstructing calculi No indication for intervention Continue Flomax  2. Prostate cancer ([place]) PSA undetectable I PSS score today is 0/0, improved Scheduled for six-month for PSA, I PSS and exam   3. Left epididymitis Prescribed Septra DS, BID x 14 days Scheduled to have  inguinal hernia repair Will follow up if symptoms do not resolve    Zara Council, PA-C  [facility] [address] [place], [place] [address] [phone]

## 2020-01-29 ENCOUNTER — Encounter
Admission: RE | Admit: 2020-01-29 | Discharge: 2020-01-29 | Disposition: A | Discharge status: Home / Self Care | Payer: Medicare Other | Source: Ambulatory Visit | Attending: General Surgery | Admitting: General Surgery

## 2020-01-29 ENCOUNTER — Other Ambulatory Visit: Payer: Self-pay

## 2020-01-29 HISTORY — DX: Personal history of urinary calculi: Z87.442

## 2020-01-29 HISTORY — DX: Prediabetes: R73.03

## 2020-01-29 HISTORY — DX: Personal history of other diseases of the digestive system: Z87.19

## 2020-01-29 HISTORY — DX: Pneumonia, unspecified organism: J18.9

## 2020-01-29 HISTORY — DX: Nausea with vomiting, unspecified: R11.2

## 2020-01-29 HISTORY — DX: Other specified postprocedural states: Z98.890

## 2020-01-29 NOTE — Patient Instructions (Addendum)
Your procedure is scheduled on: Wednesday, Feb. 17 Report to Day Surgery on the 2nd floor of the [facility]. To find out your arrival time, please call [phone] between 1PM - 3PM on: Tuesday, Feb. 16  REMEMBER: Instructions that are not followed completely may result in serious medical risk, up to and including death; or upon the discretion of your surgeon and anesthesiologist your surgery may need to be rescheduled.  Do not eat food after midnight the night before surgery.  No gum chewing, lozengers or hard candies.  You may however, drink CLEAR liquids up to 2 hours before you are scheduled to arrive for your surgery. Do not drink anything within 2 hours of the start of your surgery.  Clear liquids include: - water  - apple juice without pulp - gatorade no reds - black coffee or tea (Do NOT add milk or creamers to the coffee or tea) Do NOT drink anything that is not on this list.  No Alcohol for 24 hours before or after surgery.  No Smoking including e-cigarettes for 24 hours prior to surgery.  No chewable tobacco products for at least 6 hours prior to surgery.  No nicotine patches on the day of surgery.  On the morning of surgery brush your teeth with toothpaste and water, you may rinse your mouth with mouthwash if you wish. Do not swallow any toothpaste or mouthwash.  Notify your doctor if there is any change in your medical condition (cold, fever, infection).  Do not wear jewelry, make-up, hairpins, clips or nail polish.  Do not wear lotions, powders, or perfumes.   Do not shave 48 hours prior to surgery.   Contacts and dentures may not be worn into surgery.  Do not bring valuables to the hospital, including drivers license, insurance or credit cards.  [facility] is not responsible for any belongings or valuables.   TAKE THESE MEDICATIONS THE MORNING OF SURGERY:  1.  Omeprazole - (take one the night before and one on the morning of surgery - helps to prevent  nausea after surgery.) 2.  Sulfamethoxazole-trimethoprim  Use CHG Soap as directed on instruction sheet.  Stop Anti-inflammatories (NSAIDS) such as Advil, Aleve, Ibuprofen, Motrin, Naproxen, Naprosyn and Aspirin based products such as Excedrin, Goodys Powder, BC Powder. (May take Tylenol or Acetaminophen if needed.)  Stop ANY OVER THE COUNTER supplements until after surgery. (May continue Vitamin D, Vitamin B, and multivitamin.)  Wear comfortable clothing (specific to your surgery type) to the hospital.  If you are being discharged the day of surgery, you will not be allowed to drive home. You will need a responsible adult to drive you home and stay with you that night.   If you are taking public transportation, you will need to have a responsible adult with you. Please confirm with your physician that it is acceptable to use public transportation.   Please call [phone] if you have any questions about these instructions.

## 2020-01-30 ENCOUNTER — Encounter
Admission: RE | Admit: 2020-01-30 | Discharge: 2020-01-30 | Disposition: A | Discharge status: Home / Self Care | Payer: Medicare Other | Source: Ambulatory Visit | Attending: General Surgery | Admitting: General Surgery

## 2020-01-30 DIAGNOSIS — K4090 Unilateral inguinal hernia, without obstruction or gangrene, not specified as recurrent: Secondary | ICD-10-CM | POA: Insufficient documentation

## 2020-01-30 DIAGNOSIS — I10 Essential (primary) hypertension: Secondary | ICD-10-CM | POA: Insufficient documentation

## 2020-01-30 DIAGNOSIS — Z01810 Encounter for preprocedural cardiovascular examination: Secondary | ICD-10-CM | POA: Diagnosis present

## 2020-02-03 ENCOUNTER — Other Ambulatory Visit: Payer: Self-pay

## 2020-02-03 ENCOUNTER — Other Ambulatory Visit
Admission: RE | Admit: 2020-02-03 | Discharge: 2020-02-03 | Disposition: A | Discharge status: Home / Self Care | Payer: Medicare Other | Source: Ambulatory Visit | Attending: General Surgery | Admitting: General Surgery

## 2020-02-03 DIAGNOSIS — Z01812 Encounter for preprocedural laboratory examination: Secondary | ICD-10-CM | POA: Insufficient documentation

## 2020-02-03 DIAGNOSIS — Z20822 Contact with and (suspected) exposure to covid-19: Secondary | ICD-10-CM | POA: Diagnosis not present

## 2020-02-04 LAB — SARS CORONAVIRUS 2 (TAT 6-24 HRS): SARS Coronavirus 2: NEGATIVE

## 2020-02-05 ENCOUNTER — Other Ambulatory Visit: Payer: Self-pay

## 2020-02-05 ENCOUNTER — Encounter: Payer: Self-pay | Admitting: General Surgery

## 2020-02-05 ENCOUNTER — Encounter
Admission: RE | Disposition: A | Discharge status: Home / Self Care | Payer: Self-pay | Source: Home / Self Care | Attending: General Surgery

## 2020-02-05 ENCOUNTER — Ambulatory Visit: Payer: Medicare Other | Admitting: Certified Registered Nurse Anesthetist

## 2020-02-05 ENCOUNTER — Ambulatory Visit
Admission: RE | Admit: 2020-02-05 | Discharge: 2020-02-05 | Disposition: A | Discharge status: Home / Self Care | Payer: Medicare Other | Attending: General Surgery | Admitting: General Surgery

## 2020-02-05 DIAGNOSIS — Z885 Allergy status to narcotic agent status: Secondary | ICD-10-CM | POA: Diagnosis not present

## 2020-02-05 DIAGNOSIS — Z79899 Other long term (current) drug therapy: Secondary | ICD-10-CM | POA: Insufficient documentation

## 2020-02-05 DIAGNOSIS — K4020 Bilateral inguinal hernia, without obstruction or gangrene, not specified as recurrent: Secondary | ICD-10-CM | POA: Diagnosis present

## 2020-02-05 DIAGNOSIS — Z8612 Personal history of poliomyelitis: Secondary | ICD-10-CM | POA: Insufficient documentation

## 2020-02-05 DIAGNOSIS — K219 Gastro-esophageal reflux disease without esophagitis: Secondary | ICD-10-CM | POA: Diagnosis not present

## 2020-02-05 DIAGNOSIS — Z880 Allergy status to penicillin: Secondary | ICD-10-CM | POA: Insufficient documentation

## 2020-02-05 DIAGNOSIS — E785 Hyperlipidemia, unspecified: Secondary | ICD-10-CM | POA: Diagnosis not present

## 2020-02-05 DIAGNOSIS — Z8546 Personal history of malignant neoplasm of prostate: Secondary | ICD-10-CM | POA: Diagnosis not present

## 2020-02-05 DIAGNOSIS — E1121 Type 2 diabetes mellitus with diabetic nephropathy: Secondary | ICD-10-CM | POA: Diagnosis not present

## 2020-02-05 DIAGNOSIS — I10 Essential (primary) hypertension: Secondary | ICD-10-CM | POA: Insufficient documentation

## 2020-02-05 DIAGNOSIS — D176 Benign lipomatous neoplasm of spermatic cord: Secondary | ICD-10-CM | POA: Insufficient documentation

## 2020-02-05 DIAGNOSIS — M1990 Unspecified osteoarthritis, unspecified site: Secondary | ICD-10-CM | POA: Diagnosis not present

## 2020-02-05 HISTORY — PX: XI ROBOTIC ASSISTED INGUINAL HERNIA REPAIR WITH MESH: SHX6706

## 2020-02-05 LAB — GLUCOSE, CAPILLARY: Glucose-Capillary: 149 mg/dL — ABNORMAL HIGH (ref 70–99)

## 2020-02-05 SURGERY — REPAIR, HERNIA, INGUINAL, ROBOT-ASSISTED, LAPAROSCOPIC, USING MESH
Anesthesia: General | Site: Groin | Laterality: Bilateral

## 2020-02-05 MED ORDER — CEFAZOLIN SODIUM-DEXTROSE 2-4 GM/100ML-% IV SOLN
2.0000 g | INTRAVENOUS | Status: AC
  Administered 2020-02-05: 2 g via INTRAVENOUS

## 2020-02-05 MED ORDER — BUPIVACAINE HCL (PF) 0.25 % IJ SOLN
INTRAMUSCULAR | Status: AC
  Filled 2020-02-05: qty 30

## 2020-02-05 MED ORDER — ONDANSETRON HCL 4 MG/2ML IJ SOLN: 4.0000 mg | Freq: Once | INTRAMUSCULAR | Status: DC | PRN

## 2020-02-05 MED ORDER — ACETAMINOPHEN 10 MG/ML IV SOLN
INTRAVENOUS | Status: DC | PRN
  Administered 2020-02-05: 1000 mg via INTRAVENOUS

## 2020-02-05 MED ORDER — SODIUM CHLORIDE 0.9 % IV SOLN: INTRAVENOUS | Status: DC

## 2020-02-05 MED ORDER — EPINEPHRINE PF 1 MG/ML IJ SOLN
INTRAMUSCULAR | Status: AC
  Filled 2020-02-05: qty 1

## 2020-02-05 MED ORDER — SODIUM CHLORIDE (PF) 0.9 % IJ SOLN
INTRAMUSCULAR | Status: AC
  Filled 2020-02-05: qty 10

## 2020-02-05 MED ORDER — PROPOFOL 10 MG/ML IV BOLUS
INTRAVENOUS | Status: DC | PRN
  Administered 2020-02-05: 150 mg via INTRAVENOUS

## 2020-02-05 MED ORDER — SUCCINYLCHOLINE CHLORIDE 20 MG/ML IJ SOLN
INTRAMUSCULAR | Status: AC
  Filled 2020-02-05: qty 2

## 2020-02-05 MED ORDER — EPHEDRINE SULFATE 50 MG/ML IJ SOLN
INTRAMUSCULAR | Status: DC | PRN
  Administered 2020-02-05: 7.5 mg via INTRAVENOUS
  Administered 2020-02-05: 5 mg via INTRAVENOUS

## 2020-02-05 MED ORDER — KETOROLAC TROMETHAMINE 30 MG/ML IJ SOLN
INTRAMUSCULAR | Status: AC
  Filled 2020-02-05: qty 1

## 2020-02-05 MED ORDER — EPHEDRINE SULFATE 50 MG/ML IJ SOLN
INTRAMUSCULAR | Status: AC
  Filled 2020-02-05: qty 1

## 2020-02-05 MED ORDER — FENTANYL CITRATE (PF) 100 MCG/2ML IJ SOLN
INTRAMUSCULAR | Status: AC
  Filled 2020-02-05: qty 2

## 2020-02-05 MED ORDER — PROPOFOL 10 MG/ML IV BOLUS
INTRAVENOUS | Status: AC
  Filled 2020-02-05: qty 20

## 2020-02-05 MED ORDER — FENTANYL CITRATE (PF) 100 MCG/2ML IJ SOLN
25.0000 ug | INTRAMUSCULAR | Status: DC | PRN
  Administered 2020-02-05 (×2): 25 ug via INTRAVENOUS

## 2020-02-05 MED ORDER — ACETAMINOPHEN-CODEINE #3 300-30 MG PO TABS
ORAL_TABLET | ORAL | Status: AC
  Administered 2020-02-05: 1 via ORAL
  Filled 2020-02-05: qty 1

## 2020-02-05 MED ORDER — LIDOCAINE HCL (PF) 2 % IJ SOLN
INTRAMUSCULAR | Status: AC
  Filled 2020-02-05: qty 10

## 2020-02-05 MED ORDER — ONDANSETRON HCL 4 MG/2ML IJ SOLN
INTRAMUSCULAR | Status: AC
  Filled 2020-02-05: qty 2

## 2020-02-05 MED ORDER — FENTANYL CITRATE (PF) 100 MCG/2ML IJ SOLN
INTRAMUSCULAR | Status: AC
  Administered 2020-02-05: 25 ug via INTRAVENOUS
  Filled 2020-02-05: qty 2

## 2020-02-05 MED ORDER — ACETAMINOPHEN 10 MG/ML IV SOLN
INTRAVENOUS | Status: AC
  Filled 2020-02-05: qty 100

## 2020-02-05 MED ORDER — DEXAMETHASONE SODIUM PHOSPHATE 10 MG/ML IJ SOLN
INTRAMUSCULAR | Status: DC | PRN
  Administered 2020-02-05: 10 mg via INTRAVENOUS

## 2020-02-05 MED ORDER — ROCURONIUM BROMIDE 100 MG/10ML IV SOLN
INTRAVENOUS | Status: DC | PRN
  Administered 2020-02-05: 10 mg via INTRAVENOUS
  Administered 2020-02-05: 20 mg via INTRAVENOUS
  Administered 2020-02-05: 50 mg via INTRAVENOUS
  Administered 2020-02-05: 20 mg via INTRAVENOUS

## 2020-02-05 MED ORDER — SUGAMMADEX SODIUM 200 MG/2ML IV SOLN
INTRAVENOUS | Status: DC | PRN
  Administered 2020-02-05: 200 mg via INTRAVENOUS

## 2020-02-05 MED ORDER — ROCURONIUM BROMIDE 50 MG/5ML IV SOLN
INTRAVENOUS | Status: AC
  Filled 2020-02-05: qty 1

## 2020-02-05 MED ORDER — SUGAMMADEX SODIUM 200 MG/2ML IV SOLN
INTRAVENOUS | Status: AC
  Filled 2020-02-05: qty 2

## 2020-02-05 MED ORDER — CEFAZOLIN SODIUM-DEXTROSE 2-4 GM/100ML-% IV SOLN
INTRAVENOUS | Status: AC
  Filled 2020-02-05: qty 100

## 2020-02-05 MED ORDER — ONDANSETRON HCL 4 MG/2ML IJ SOLN
INTRAMUSCULAR | Status: DC | PRN
  Administered 2020-02-05: 4 mg via INTRAVENOUS

## 2020-02-05 MED ORDER — KETOROLAC TROMETHAMINE 30 MG/ML IJ SOLN
INTRAMUSCULAR | Status: DC | PRN
  Administered 2020-02-05: 15 mg via INTRAVENOUS

## 2020-02-05 MED ORDER — PHENYLEPHRINE HCL (PRESSORS) 10 MG/ML IV SOLN
INTRAVENOUS | Status: AC
  Filled 2020-02-05: qty 1

## 2020-02-05 MED ORDER — FENTANYL CITRATE (PF) 100 MCG/2ML IJ SOLN
INTRAMUSCULAR | Status: DC | PRN
  Administered 2020-02-05 (×2): 50 ug via INTRAVENOUS

## 2020-02-05 MED ORDER — ACETAMINOPHEN-CODEINE #3 300-30 MG PO TABS: 1.0000 | ORAL_TABLET | Freq: Once | ORAL | Status: AC

## 2020-02-05 MED ORDER — ACETAMINOPHEN-CODEINE #3 300-30 MG PO TABS: 1.0000 | ORAL_TABLET | ORAL | 0 refills | Status: DC | PRN

## 2020-02-05 MED ORDER — LIDOCAINE HCL (CARDIAC) PF 100 MG/5ML IV SOSY
PREFILLED_SYRINGE | INTRAVENOUS | Status: DC | PRN
  Administered 2020-02-05: 100 mg via INTRAVENOUS

## 2020-02-05 MED ORDER — BUPIVACAINE-EPINEPHRINE 0.25% -1:200000 IJ SOLN
INTRAMUSCULAR | Status: DC | PRN
  Administered 2020-02-05: 10 mL

## 2020-02-05 MED ORDER — DEXAMETHASONE SODIUM PHOSPHATE 10 MG/ML IJ SOLN
INTRAMUSCULAR | Status: AC
  Filled 2020-02-05: qty 1

## 2020-02-05 MED ORDER — GLYCOPYRROLATE 0.2 MG/ML IJ SOLN
INTRAMUSCULAR | Status: AC
  Filled 2020-02-05: qty 1

## 2020-02-05 MED ORDER — PHENYLEPHRINE HCL (PRESSORS) 10 MG/ML IV SOLN
INTRAVENOUS | Status: DC | PRN
  Administered 2020-02-05: 200 ug via INTRAVENOUS
  Administered 2020-02-05: 100 ug via INTRAVENOUS

## 2020-02-05 SURGICAL SUPPLY — 53 items
"PENCIL ELECTRO HAND CTR " (MISCELLANEOUS) IMPLANT
BAG INFUSER PRESSURE 100CC (MISCELLANEOUS) IMPLANT
BLADE SURG SZ11 CARB STEEL (BLADE) ×2 IMPLANT
CANISTER SUCT 1200ML W/VALVE (MISCELLANEOUS) ×2 IMPLANT
CHLORAPREP W/TINT 26 (MISCELLANEOUS) ×2 IMPLANT
COVER TIP SHEARS 8 DVNC (MISCELLANEOUS) ×1 IMPLANT
COVER TIP SHEARS 8MM DA VINCI (MISCELLANEOUS) ×1
COVER WAND RF STERILE (DRAPES) ×4 IMPLANT
DEFOGGER SCOPE WARMER CLEARIFY (MISCELLANEOUS) ×2 IMPLANT
DERMABOND ADVANCED (GAUZE/BANDAGES/DRESSINGS) ×1
DERMABOND ADVANCED .7 DNX12 (GAUZE/BANDAGES/DRESSINGS) ×1 IMPLANT
DRAPE 3/4 80X56 (DRAPES) ×2 IMPLANT
DRAPE ARM DVNC X/XI (DISPOSABLE) ×3 IMPLANT
DRAPE COLUMN DVNC XI (DISPOSABLE) ×1 IMPLANT
DRAPE DA VINCI XI ARM (DISPOSABLE) ×3
DRAPE DA VINCI XI COLUMN (DISPOSABLE) ×1
ELECT CAUTERY BLADE 6.4 (BLADE) IMPLANT
ELECT REM PT RETURN 9FT ADLT (ELECTROSURGICAL) ×2
ELECTRODE REM PT RTRN 9FT ADLT (ELECTROSURGICAL) ×1 IMPLANT
GLOVE BIO SURGEON STRL SZ 6.5 (GLOVE) ×4 IMPLANT
GLOVE BIOGEL PI IND STRL 6.5 (GLOVE) ×2 IMPLANT
GLOVE BIOGEL PI INDICATOR 6.5 (GLOVE) ×2
GOWN STRL REUS W/ TWL LRG LVL3 (GOWN DISPOSABLE) ×3 IMPLANT
GOWN STRL REUS W/TWL LRG LVL3 (GOWN DISPOSABLE) ×3
IRRIGATOR SUCT 8 DISP DVNC XI (IRRIGATION / IRRIGATOR) IMPLANT
IRRIGATOR SUCTION 8MM XI DISP (IRRIGATION / IRRIGATOR)
IV NS 1000ML (IV SOLUTION)
IV NS 1000ML BAXH (IV SOLUTION) IMPLANT
KIT PINK PAD W/HEAD ARE REST (MISCELLANEOUS) ×2
KIT PINK PAD W/HEAD ARM REST (MISCELLANEOUS) ×1 IMPLANT
LABEL OR SOLS (LABEL) IMPLANT
MESH 3DMAX 4X6 LT LRG (Mesh General) ×1 IMPLANT
MESH 3DMAX 4X6 RT LRG (Mesh General) ×1 IMPLANT
MESH 3DMAX MID 4X6 LT LRG (Mesh General) IMPLANT
MESH 3DMAX MID 4X6 RT LRG (Mesh General) IMPLANT
NDL INSUFFLATION 14GA 120MM (NEEDLE) ×1 IMPLANT
NEEDLE HYPO 22GX1.5 SAFETY (NEEDLE) ×2 IMPLANT
NEEDLE INSUFFLATION 14GA 120MM (NEEDLE) ×2 IMPLANT
OBTURATOR OPTICAL STANDARD 8MM (TROCAR) ×1
OBTURATOR OPTICAL STND 8 DVNC (TROCAR) ×1
OBTURATOR OPTICALSTD 8 DVNC (TROCAR) ×1 IMPLANT
PACK LAP CHOLECYSTECTOMY (MISCELLANEOUS) ×2 IMPLANT
PENCIL ELECTRO HAND CTR (MISCELLANEOUS) IMPLANT
SEAL CANN UNIV 5-8 DVNC XI (MISCELLANEOUS) ×3 IMPLANT
SEAL XI 5MM-8MM UNIVERSAL (MISCELLANEOUS) ×3
SOLUTION ELECTROLUBE (MISCELLANEOUS) ×2 IMPLANT
SUT MNCRL AB 4-0 PS2 18 (SUTURE) ×2 IMPLANT
SUT VIC AB 2-0 SH 27 (SUTURE) ×2
SUT VIC AB 2-0 SH 27XBRD (SUTURE) ×1 IMPLANT
SUT VLOC 90 S/L VL9 GS22 (SUTURE) ×4 IMPLANT
TAPE TRANSPORE STRL 2 31045 (GAUZE/BANDAGES/DRESSINGS) ×2 IMPLANT
TRAY FOLEY MTR SLVR 16FR STAT (SET/KITS/TRAYS/PACK) ×2 IMPLANT
TUBING EVAC SMOKE HEATED PNEUM (TUBING) ×2 IMPLANT

## 2020-02-05 NOTE — Transfer of Care (Signed)
Immediate Anesthesia Transfer of Care Note  Patient: Alex Lang  Procedure(s) Performed: XI ROBOTIC ASSISTED INGUINAL HERNIA REPAIR WITH MESH (Bilateral Groin)  Patient Location: PACU  Anesthesia Type:General  Level of Consciousness: awake and drowsy  Airway & Oxygen Therapy: Patient Spontanous Breathing and Patient connected to face mask oxygen  Post-op Assessment: Report given to RN and Post -op Vital signs reviewed and stable  Post vital signs: Reviewed and stable  Last Vitals:  Vitals Value Taken Time  BP 129/58 02/05/20 1245  Temp 36.7 C 02/05/20 1244  Pulse 63 02/05/20 1246  Resp 22 02/05/20 1246  SpO2 99 % 02/05/20 1246  Vitals shown include unvalidated device data.  Last Pain:  Vitals:   02/05/20 1244  TempSrc: Temporal         Complications: No apparent anesthesia complications

## 2020-02-05 NOTE — Interval H&P Note (Signed)
History and Physical Interval Note:  02/05/2020 9:36 AM  Alex Lang  has presented today for surgery, with the diagnosis of K40.90 non recurrent unilateral inguinal hernia w/o obstruction or gangrene.  The various methods of treatment have been discussed with the patient and family. After consideration of risks, benefits and other options for treatment, the patient has consented to  Procedure(s): XI ROBOTIC [place] (Right), possible bilateral as a surgical intervention.  The patient's history has been reviewed, patient examined, no change in status, stable for surgery.  I have reviewed the patient's chart and labs.  Questions were answered to the patient's satisfaction.     Herbert Pun

## 2020-02-05 NOTE — Anesthesia Procedure Notes (Signed)
Procedure Name: Intubation Date/Time: 02/05/2020 9:58 AM Performed by: Johnna Acosta, CRNA Pre-anesthesia Checklist: Patient identified, Emergency Drugs available, Suction available, Patient being monitored and Timeout performed Patient Re-evaluated:Patient Re-evaluated prior to induction Oxygen Delivery Method: Circle system utilized Preoxygenation: Pre-oxygenation with 100% oxygen Induction Type: IV induction Ventilation: Mask ventilation without difficulty Laryngoscope Size: McGraph and 3 Grade View: Grade I Tube type: Oral Tube size: 7.5 mm Number of attempts: 1 Airway Equipment and Method: Stylet and Video-laryngoscopy Placement Confirmation: ETT inserted through vocal cords under direct vision,  positive ETCO2 and breath sounds checked- equal and bilateral Secured at: 20 cm Tube secured with: Tape Dental Injury: Teeth and Oropharynx as per pre-operative assessment

## 2020-02-05 NOTE — Op Note (Signed)
Preoperative diagnosis: Bilateral inguinal hernia.   Postoperative diagnosis: Bilateral inguinal hernia.  Procedure: Robotic assisted Laparoscopic Transabdominal preperitoneal laparoscopic (TAPP) repair of bilateral inguinal hernia.  Anesthesia: GETA  Surgeon: Dr. Windell Moment  Wound Classification: Clean  Indications:  Patient is a 84 y.o. male developed a symptomatic bilateral inguinal hernia. Repair was indicated.  Findings: 1. Right pantaloon and left indirect inguinal hernias identified 2. Vas deferens and cord structures identified and preserved 3. Bard 3D Max mesh used for repair 4. Adequate hemostasis.   Description of procedure: The patient was taken to the operating room and the correct side of surgery was verified. The patient was placed supine with arms tucked at the sides. After obtaining adequate anesthesia, the patient's abdomen was prepped and draped in standard sterile fashion. The patient was placed in the Trendelenburg position. A time-out was completed verifying correct patient, procedure, site, positioning, and implant(s) and/or special equipment prior to beginning this procedure. A Veress needle was placed at the umbilicus and pneumoperitoneum created with insufflation of carbon dioxide to 15 mmHg. After the Veress needle was removed, an 8-mm trocar was placed on supra umbilical area and the 30 angled laparoscope inserted. Two 8-mm trocars were then placed lateral to the rectus sheath under direct visualization. Both inguinal regions were inspected and the median umbilical ligament, medial umbilical ligament, and lateral umbilical fold were identified.  The robotic arms were docked. The robotic scope was inserted and the pelvic area anatomy targeted.  The right inguinal hernia repaired first since it was the side of most symptoms. The peritoneum was incised with scissors along a line 5 cm above the superior edge of the hernia defect, extending from the median umbilical  ligament to the anterior superior iliac spine. The peritoneal flap was mobilized inferiorly using blunt and sharp dissection. The inferior epigastric vessels were exposed and the pubic symphysis was identified. Cooper's ligament was dissected to its junction with the iliac vein. The dissection was continued inferiorly to the iliopubic tract, with care taken to avoid injury to the femoral branch of the genitofemoral nerve and the lateral femoral cutaneous nerve. The cord structures were parietalized. The hernia was identified and reduced by gentle traction.  The indirect hernia sac was noted mobilized from the cord structures and reduced into the peritoneal cavity. A large fat containing direct hernia was also reduced.  A large piece of mesh was rolled longitudinally into a compact cylinder and passed through a trocar. The cylinder was placed along the inferior aspect of the working space and unrolled into place to completely cover the direct, indirect, and femoral spaces. The mesh was secured into place superiorly to the anterior abdominal wall and inferiorly and medially to Cooper's ligament with absorbable sutures. Care was taken to avoid the inferolateral triangles containing the iliac vessels and genital nerves. The peritoneal flap was closed over the mesh and secured with suture in similar positions of safety.  The left inguinal area explored and small indirect inguinal hernia was identified. The cord lipoma was reduced and the hernia repaired using the same fashion.  After ensuring adequate hemostasis, the trocars were removed and the pneumoperitoneum allowed to escape. The trocar incisions were closed using monocryl and skin adhesive dressings applied.  The patient tolerated the procedure well and was taken to the postanesthesia care unit in stable condition.   Specimen: None  Complications: None  Estimated Blood Loss: 5 mL

## 2020-02-05 NOTE — Progress Notes (Signed)
States that he has dizziness   Having hard time waking up  In and out of sleep

## 2020-02-05 NOTE — Anesthesia Preprocedure Evaluation (Signed)
Anesthesia Evaluation  Patient identified by MRN, date of birth, ID band Patient awake    Reviewed: Allergy & Precautions, H&P , NPO status , Patient's Chart, lab work & pertinent test results, reviewed documented beta blocker date and time   History of Anesthesia Complications (+) PONV and history of anesthetic complications  Airway Mallampati: II  TM Distance: >3 FB Neck ROM: full    Dental no notable dental hx. (+) Dental Advidsory Given, Caps, Teeth Intact   Pulmonary neg pulmonary ROS,    Pulmonary exam normal        Cardiovascular Exercise Tolerance: Good hypertension, (-) angina(-) Past MI and (-) Cardiac Stents Normal cardiovascular exam(-) dysrhythmias (-) Valvular Problems/Murmurs     Neuro/Psych negative neurological ROS  negative psych ROS   GI/Hepatic Neg liver ROS, hiatal hernia, GERD  ,  Endo/Other  diabetes (borderline)  Renal/GU Renal disease (kidney stones)  negative genitourinary   Musculoskeletal   Abdominal   Peds  Hematology negative hematology ROS (+)   Anesthesia Other Findings Past Medical History: No date: Acid reflux 10/02/2014: BP (high blood pressure) No date: BPH (benign prostatic hyperplasia) No date: Elevated PSA No date: Erectile dysfunction No date: GERD (gastroesophageal reflux disease) No date: Gross hematuria No date: History of hiatal hernia No date: History of kidney stones No date: Hypertension No date: Hypogonadism in male No date: Kidney stone     Comment:  bilateral 06/21/2015: Kidney stones No date: Microscopic hematuria 2016: Nephrolithiasis No date: Pneumonia No date: PONV (postoperative nausea and vomiting) No date: Pre-diabetes No date: Prostate cancer ([place]) 08/11/2015: Renal stones   Reproductive/Obstetrics negative OB ROS                             Anesthesia Physical Anesthesia Plan  ASA: III  Anesthesia Plan: General    Post-op Pain Management:    Induction: Intravenous  PONV Risk Score and Plan: 3 and Ondansetron, Dexamethasone and Treatment may vary due to age or medical condition  Airway Management Planned: Oral ETT  Additional Equipment:   Intra-op Plan:   Post-operative Plan: Extubation in OR  Informed Consent: I have reviewed the patients History and Physical, chart, labs and discussed the procedure including the risks, benefits and alternatives for the proposed anesthesia with the patient or authorized representative who has indicated his/her understanding and acceptance.     Dental Advisory Given  Plan Discussed with: Anesthesiologist, CRNA and Surgeon  Anesthesia Plan Comments:         Anesthesia Quick Evaluation

## 2020-02-05 NOTE — Discharge Instructions (Signed)
AMBULATORY SURGERY  DISCHARGE INSTRUCTIONS   1) The drugs that you were given will stay in your system until tomorrow so for the next 24 hours you should not:  A) Drive an automobile B) Make any legal decisions C) Drink any alcoholic beverage   2) You may resume regular meals tomorrow.  Today it is better to start with liquids and gradually work up to solid foods.  You may eat anything you prefer, but it is better to start with liquids, then soup and crackers, and gradually work up to solid foods.   3) Please notify your doctor immediately if you have any unusual bleeding, trouble breathing, redness and pain at the surgery site, drainage, fever, or pain not relieved by medication.    4) Additional Instructions:        Please contact your physician with any problems or Same Day Surgery at [phone], Monday through Friday 6 am to 4 pm, or [place] at [facility] number at [phone]. Diet: Resume home heart healthy regular diet.   Activity: No heavy lifting >10 pounds (children, pets, laundry, garbage) or strenuous activity until follow-up, but light activity and walking are encouraged. Do not drive or drink alcohol if taking narcotic pain medications.  Wound care: May shower with soapy water and pat dry (do not rub incisions), but no baths or submerging incision underwater until follow-up. (no swimming)   Medications: Resume all home medications. For mild to moderate pain: acetaminophen (Tylenol) or ibuprofen (if no kidney disease). Combining Tylenol with alcohol can substantially increase your risk of causing liver disease. Narcotic pain medications, if prescribed, can be used for severe pain, though may cause nausea, constipation, and drowsiness. If you do not need the narcotic pain medication, you do not need to fill the prescription.  Call office [phone]) at any time if any questions, worsening pain, fevers/chills, bleeding, drainage from incision site, or  other concerns.

## 2020-02-06 NOTE — Anesthesia Postprocedure Evaluation (Signed)
Anesthesia Post Note  Patient: Alex Lang  Procedure(s) Performed: XI ROBOTIC ASSISTED INGUINAL HERNIA REPAIR WITH MESH (Bilateral Groin)  Patient location during evaluation: PACU Anesthesia Type: General Level of consciousness: awake and alert Pain management: pain level controlled Vital Signs Assessment: post-procedure vital signs reviewed and stable Respiratory status: spontaneous breathing, nonlabored ventilation, respiratory function stable and patient connected to nasal cannula oxygen Cardiovascular status: blood pressure returned to baseline and stable Postop Assessment: no apparent nausea or vomiting Anesthetic complications: no     Last Vitals:  Vitals:   02/05/20 1413 02/05/20 1543  BP: (!) 175/66 (!) 170/71  Pulse: (!) 58 65  Resp: 20 20  Temp: (!) 36.1 C   SpO2: 97% 98%    Last Pain:  Vitals:   02/05/20 1543  TempSrc:   PainSc: 0-No pain                 Martha Clan

## 2020-07-15 ENCOUNTER — Other Ambulatory Visit: Payer: Self-pay

## 2020-07-15 DIAGNOSIS — C61 Malignant neoplasm of prostate: Secondary | ICD-10-CM

## 2020-07-17 ENCOUNTER — Other Ambulatory Visit: Payer: Medicare Other

## 2020-07-17 ENCOUNTER — Other Ambulatory Visit: Payer: Self-pay

## 2020-07-17 DIAGNOSIS — C61 Malignant neoplasm of prostate: Secondary | ICD-10-CM

## 2020-07-18 LAB — PSA: Prostate Specific Ag, Serum: <0.1 ng/mL (ref 0.0–4.0)

## 2020-07-21 NOTE — Progress Notes (Signed)
01/02/2019 9:09 AM   Alex Lang 10-05-1936 [id number]  Referring provider: [place] H[place], [place],  [place] [address]  Chief Complaint  Patient presents with  . Follow-up    81mo w/IPSS    HPI: Alex Lang is 84 y.o. male with prostate cancer and recurrent nephrolithiasis who presents today for six-month follow-up with his wife, Alex Lang.   Prostate cancer Patient withcT2a Prostate cancer- Dx in 10/2014 with Gleason 3+4, 3+3 prostate cancer involving 8 of 13 cores, iPSA 4.8 DRE also somewhat concerning for cancer with questionable induration at right base. TRUS vol 45 cc. He underwent I-125 brachytherapy seed placement on 01/26/2015 by Dr. Hollice Espy.  He also received adjunctive ADT therapy for 4-6 months. CT scan in 12/2017 noted no evidence of metastatic disease and brachy seeds were visualized.    He was released from the Cancer center on 01/17/2019 by Dr. Grayland Ormond.  His most recent PSA on 06/2020 was <0.1 ng/mL.     I PSS score 0/0.  Previous I PSS score 0/0.  Currently on Flomax 0.4 mg daily.   IPSS    Row Name 07/22/20 0900         International Prostate Symptom Score   How often have you had the sensation of not emptying your bladder? Not at All     How often have you had to urinate less than every two hours? Not at All     How often have you found you stopped and started again several times when you urinated? Not at All     How often have you found it difficult to postpone urination? Not at All     How often have you had a weak urinary stream? Not at All     How often have you had to strain to start urination? Not at All     How many times did you typically get up at night to urinate? None     Total IPSS Score 0       Quality of Life due to urinary symptoms   If you were to spend the rest of your life with your urinary condition just the way it is now how would you feel about that? Delighted            Score:   1-7 Mild 8-19 Moderate 20-35 Severe   Recurrent nephrolithiasis CT scan from 01/08/2018 reveals small bilateral nonobstructing calculi but no ureteral stones, hydronephrosis, or ureteral obstruction.  There is also no evidence of metastatic disease from his prostate cancer and bradycardia seeds are notable.  He does have an incidental right inguinal hernia.  Patient experienced hyperkalemia with Urocit-K.  KUB today demonstrates the left renal stone remains stable.    PMH: Past Medical History:  Diagnosis Date  . Acid reflux   . BP (high blood pressure) 10/02/2014  . BPH (benign prostatic hyperplasia)   . Elevated PSA   . Erectile dysfunction   . GERD (gastroesophageal reflux disease)   . Gross hematuria   . History of hiatal hernia   . History of kidney stones   . Hypertension   . Hypogonadism in male   . Kidney stone    bilateral  . Kidney stones 06/21/2015  . Microscopic hematuria   . Nephrolithiasis 2016  . Pneumonia   . PONV (postoperative nausea and vomiting)   . Pre-diabetes   . Prostate cancer ([place])   . Renal stones 08/11/2015    Surgical  History: Past Surgical History:  Procedure Laterality Date  . EXTRACORPOREAL SHOCK WAVE LITHOTRIPSY Right 07/23/2015   Procedure: EXTRACORPOREAL SHOCK WAVE LITHOTRIPSY (ESWL);  Surgeon: Hollice Espy, MD;  Location: [facility] ORS;  Service: Urology;  Laterality: Right;  . EXTRACORPOREAL SHOCK WAVE LITHOTRIPSY Left 09/03/2015   Procedure: EXTRACORPOREAL SHOCK WAVE LITHOTRIPSY (ESWL);  Surgeon: Nickie Retort, MD;  Location: [facility] ORS;  Service: Urology;  Laterality: Left;  . extracorporeal shockwave lithotripsy  2016  . KIDNEY STONE SURGERY    . laser lithotripsy and stent placement  2005  . ORTHOPEDIC SURGERY Left    torn meniscus  . PROSTATE SURGERY     seeds  . XI ROBOTIC ASSISTED INGUINAL HERNIA REPAIR WITH MESH Bilateral 02/05/2020   Procedure: XI ROBOTIC ASSISTED INGUINAL HERNIA REPAIR WITH MESH;  Surgeon: Herbert Pun, MD;  Location: [facility] ORS;  Service: General;  Laterality: Bilateral;    Home Medications:  Allergies as of 07/22/2020      Reactions   Other Nausea And Vomiting   Darvocet   Oxycodone Nausea And Vomiting   Propoxyphene Nausea And Vomiting      Medication List       Accurate as of July 22, 2020  9:09 AM. If you have any questions, ask your nurse or doctor.        STOP taking these medications   acetaminophen-codeine 300-30 MG tablet Commonly known as: TYLENOL #3 Stopped by: Chassie Pennix, PA-C   sulfamethoxazole-trimethoprim 800-160 MG tablet Commonly known as: BACTRIM DS Stopped by: Trinia Georgi, PA-C     TAKE these medications   amLODipine 5 MG tablet Commonly known as: NORVASC Take 5 mg by mouth daily.   benazepril 10 MG tablet Commonly known as: LOTENSIN Take 10 mg by mouth 2 (two) times daily.   Calcium 600 600 MG Tabs tablet Generic drug: calcium carbonate Take 600 mg by mouth daily.   FISH OIL + D3 PO Take 1,200 mg by mouth daily.   Ginkgo Biloba 40 MG Tabs Take 120 mg by mouth daily.   ibuprofen 200 MG tablet Commonly known as: ADVIL Take 200-400 mg by mouth every 4 (four) hours as needed for mild pain or moderate pain.   Lysine 1000 MG Tabs Take 1,000 mg by mouth daily.   multivitamin with minerals tablet Take 1 tablet by mouth daily. Centrum   omeprazole 20 MG capsule Commonly known as: PRILOSEC Take 20 mg by mouth 2 (two) times daily.   polyethylene glycol 17 g packet Commonly known as: MIRALAX / GLYCOLAX Take 17 g by mouth every Monday, Wednesday, and Friday.   tamsulosin 0.4 MG Caps capsule Commonly known as: FLOMAX TAKE ONE (1) CAPSULE EACH DAY What changed:   how much to take  how to take this  when to take this  additional instructions   Vitamin D3 125 MCG (5000 UT) Tabs Take 5,000 Units by mouth daily.       Allergies:  Allergies  Allergen Reactions  . Other Nausea And Vomiting    Darvocet  .  Oxycodone Nausea And Vomiting  . Propoxyphene Nausea And Vomiting    Family History: Family History  Problem Relation Age of Onset  . Hypertension Mother   . Cancer Maternal Grandfather   . Cancer Brother   . Prostate cancer Brother   . Kidney disease Neg Hx   . Bladder Cancer Neg Hx     Social History:  reports that he has never smoked. He has never used smokeless tobacco. He  reports that he does not drink alcohol and does not use drugs.  ROS: For pertinent review of systems please refer to history of present illness  Physical Exam: BP (!) 167/48   Pulse (!) 57   Ht 5\' 9"  (1.753 m)   Wt 203 lb (92.1 kg)   BMI 29.98 kg/m   Constitutional:  Well nourished. Alert and oriented, No acute distress. HEENT: [place] AT, mask in place.  Trachea midline Cardiovascular: No clubbing, cyanosis, or edema. Respiratory: Normal respiratory effort, no increased work of breathing. GI: Abdomen is soft, non tender, non distended, no abdominal masses. Liver and spleen not palpable.  No hernias appreciated.  Stool sample for occult testing is not indicated.   GU: No CVA tenderness.  No bladder fullness or masses.  Patient with circumcised phallus.  Urethral meatus is patent.  No penile discharge. No penile lesions or rashes. Scrotum without lesions, cysts, rashes and/or edema.  Testicles are located scrotally bilaterally. No masses are appreciated in the testicles. Left and right epididymis are normal. Rectal: Patient with  normal sphincter tone. Anus and perineum without scarring or rashes. No rectal masses are appreciated. Prostate is small and fibrous.  No nodules noted.  Seminal vesicles could not be palpated.  Skin: No rashes, bruises or suspicious lesions. Lymph: No inguinal adenopathy. Neurologic: Grossly intact, no focal deficits, moving all 4 extremities. Psychiatric: Normal mood and affect.   Laboratory Data: Lab Results  Component Value Date   PSA1 <0.1 07/17/2020   PSA1 <0.1 01/16/2020    PSA1 <0.1 07/09/2019   Pertinent imaging CLINICAL DATA:  Follow-up kidney stones  EXAM: ABDOMEN - 1 VIEW  COMPARISON:  07/02/2018  FINDINGS: Scattered large and small bowel gas is noted. Previously seen left renal stone is again identified and stable. No definitive right renal calculi are seen. Prostate therapy seeds are noted. Degenerative changes of lumbar spine are noted.  IMPRESSION: Stable left renal stone.  No new focal abnormality is noted.   Electronically Signed   By: Inez Catalina M.D.   On: 07/22/2020 14:21 I have independently reviewed the films.  See HPI.   Assessment & Plan:     1. Kidney stones Bilateral non obstructing calculi No indication for intervention Continue Flomax  2. Prostate cancer ([place]) PSA undetectable I PSS score today is 0/0, stable Scheduled for six-month for PSA, I PSS and exam   Zara Council, PA-C  [facility] [address] [place],  [address] [phone]

## 2020-07-22 ENCOUNTER — Other Ambulatory Visit: Payer: Self-pay

## 2020-07-22 ENCOUNTER — Ambulatory Visit (INDEPENDENT_AMBULATORY_CARE_PROVIDER_SITE_OTHER): Payer: Medicare Other | Admitting: Urology

## 2020-07-22 ENCOUNTER — Ambulatory Visit
Admission: RE | Admit: 2020-07-22 | Discharge: 2020-07-22 | Disposition: A | Discharge status: Home / Self Care | Payer: Medicare Other | Source: Ambulatory Visit | Attending: Urology | Admitting: Urology

## 2020-07-22 ENCOUNTER — Encounter: Payer: Self-pay | Admitting: Urology

## 2020-07-22 ENCOUNTER — Ambulatory Visit
Admission: RE | Admit: 2020-07-22 | Discharge: 2020-07-22 | Disposition: A | Discharge status: Home / Self Care | Payer: Medicare Other | Attending: Urology | Admitting: Urology

## 2020-07-22 VITALS — BP 167/48 | HR 57 | Ht 69.0 in | Wt 203.0 lb

## 2020-07-22 DIAGNOSIS — N200 Calculus of kidney: Secondary | ICD-10-CM | POA: Diagnosis present

## 2020-07-22 DIAGNOSIS — C61 Malignant neoplasm of prostate: Secondary | ICD-10-CM | POA: Diagnosis not present

## 2020-07-23 ENCOUNTER — Telehealth: Payer: Self-pay | Admitting: Family Medicine

## 2020-07-23 NOTE — Telephone Encounter (Signed)
Patient notified and voiced understanding.

## 2020-07-23 NOTE — Telephone Encounter (Signed)
-----   Message from Nori Riis, PA-C sent at 07/22/2020  2:42 PM EDT ----- Please let Mr. Karge know that his stone is in the same place on the left and hasn't changed in size.

## 2020-08-29 ENCOUNTER — Other Ambulatory Visit: Payer: Self-pay | Admitting: Urology

## 2020-08-29 DIAGNOSIS — N200 Calculus of kidney: Secondary | ICD-10-CM

## 2020-10-09 ENCOUNTER — Other Ambulatory Visit: Payer: Self-pay | Admitting: Orthopedic Surgery

## 2020-10-09 DIAGNOSIS — M7989 Other specified soft tissue disorders: Secondary | ICD-10-CM

## 2020-10-09 DIAGNOSIS — M19019 Primary osteoarthritis, unspecified shoulder: Secondary | ICD-10-CM

## 2020-10-09 DIAGNOSIS — M1712 Unilateral primary osteoarthritis, left knee: Secondary | ICD-10-CM

## 2020-10-23 ENCOUNTER — Other Ambulatory Visit: Payer: Self-pay

## 2020-10-23 ENCOUNTER — Ambulatory Visit
Admission: RE | Admit: 2020-10-23 | Discharge: 2020-10-23 | Disposition: A | Discharge status: Home / Self Care | Payer: Medicare Other | Source: Ambulatory Visit | Attending: Orthopedic Surgery | Admitting: Orthopedic Surgery

## 2020-10-23 DIAGNOSIS — M1712 Unilateral primary osteoarthritis, left knee: Secondary | ICD-10-CM | POA: Diagnosis present

## 2020-10-23 DIAGNOSIS — M7989 Other specified soft tissue disorders: Secondary | ICD-10-CM | POA: Diagnosis present

## 2020-10-23 DIAGNOSIS — M19019 Primary osteoarthritis, unspecified shoulder: Secondary | ICD-10-CM | POA: Insufficient documentation

## 2021-01-22 ENCOUNTER — Other Ambulatory Visit: Payer: Self-pay

## 2021-01-22 ENCOUNTER — Other Ambulatory Visit: Payer: Medicare Other

## 2021-01-22 DIAGNOSIS — C61 Malignant neoplasm of prostate: Secondary | ICD-10-CM

## 2021-01-23 LAB — PSA: Prostate Specific Ag, Serum: <0.1 ng/mL (ref 0.0–4.0)

## 2021-01-25 NOTE — Progress Notes (Signed)
01/02/2019 9:45 AM   [name] November 29, 1936 [id number]  Referring provider: Baxter Hire, MD [place],  [place] [address]  Chief Complaint  Patient presents with  . Prostate Cancer   Urological history: 1. cT2a Prostate cancer - PSA < 0.1 ng/mL in 01/2021 - Dx in 10/2014 with Gleason 3+4, 3+3 prostate cancer involving 8 of 13 cores, iPSA 4.8 DRE also somewhat concerning for cancer with questionable induration at right base. TRUS vol 45 cc - underwent I-125 brachytherapy seed placement on 01/26/2015  - received adjunctive ADT therapy for 4-6 months  2. Nephrolithiasis - left renal stone KUB 07/2020 - stone composition 30% calcium oxalate monohydrate and 70% uric acid - 24 hour metabolic work up - low urine volume, high urine pH, increased uric acid secretion and increased uric acid stone risk  3. High risk hematuria - work up completed 2014 - CTU, cysto in OR - NED - UA negative for micro heme in 07/2020  4. ED - failed PDE5i's - not interested in further treatment  HPI: Alex Lang is 85 y.o. male who presents today for six-month follow-up.  He is doing well for the exception of a few weeks ago he had an episode of painless gross hematuria.  He states it was actually his wife who pointed it out in the toilet.  Patient denies any modifying or aggravating factors.  Patient denies any dysuria or suprapubic/flank pain.  Patient denies any fevers, chills, nausea or vomiting.   He continues to take tamsulosin 0.4 mg daily.  He is also wondering if there is any treatment for erectile dysfunction as PDE 5 inhibitors did not give him a satisfactory erection.  Patient is no longer having spontaneous erections.   He denies any pain or curvature with erections.   PMH: Past Medical History:  Diagnosis Date  . Acid reflux   . BP (high blood pressure) 10/02/2014  . BPH (benign prostatic hyperplasia)   . Elevated PSA   . Erectile  dysfunction   . GERD (gastroesophageal reflux disease)   . Gross hematuria   . History of hiatal hernia   . History of kidney stones   . Hypertension   . Hypogonadism in male   . Kidney stone    bilateral  . Kidney stones 06/21/2015  . Microscopic hematuria   . Nephrolithiasis 2016  . Pneumonia   . PONV (postoperative nausea and vomiting)   . Pre-diabetes   . Prostate cancer ([place])   . Renal stones 08/11/2015    Surgical History: Past Surgical History:  Procedure Laterality Date  . EXTRACORPOREAL SHOCK WAVE LITHOTRIPSY Right 07/23/2015   Procedure: EXTRACORPOREAL SHOCK WAVE LITHOTRIPSY (ESWL);  Surgeon: Hollice Espy, MD;  Location: [facility] ORS;  Service: Urology;  Laterality: Right;  . EXTRACORPOREAL SHOCK WAVE LITHOTRIPSY Left 09/03/2015   Procedure: EXTRACORPOREAL SHOCK WAVE LITHOTRIPSY (ESWL);  Surgeon: Nickie Retort, MD;  Location: [facility] ORS;  Service: Urology;  Laterality: Left;  . extracorporeal shockwave lithotripsy  2016  . KIDNEY STONE SURGERY    . laser lithotripsy and stent placement  2005  . ORTHOPEDIC SURGERY Left    torn meniscus  . PROSTATE SURGERY     seeds  . XI ROBOTIC ASSISTED INGUINAL HERNIA REPAIR WITH MESH Bilateral 02/05/2020   Procedure: XI ROBOTIC ASSISTED INGUINAL HERNIA REPAIR WITH MESH;  Surgeon: Herbert Pun, MD;  Location: [facility] ORS;  Service: General;  Laterality: Bilateral;    Home Medications:  Allergies as of 01/26/2021  Reactions   Other Nausea And Vomiting   Darvocet   Oxycodone Nausea And Vomiting   Propoxyphene Nausea And Vomiting      Medication List       Accurate as of January 26, 2021  9:45 AM. If you have any questions, ask your nurse or doctor.        amLODipine 5 MG tablet Commonly known as: NORVASC Take 5 mg by mouth daily.   benazepril 10 MG tablet Commonly known as: LOTENSIN Take 10 mg by mouth 2 (two) times daily.   calcium carbonate 600 MG Tabs tablet Commonly known as: OS-CAL Take 600 mg by mouth  daily.   FISH OIL + D3 PO Take 1,200 mg by mouth daily.   Ginkgo Biloba 40 MG Tabs Take 120 mg by mouth daily.   ibuprofen 200 MG tablet Commonly known as: ADVIL Take 200-400 mg by mouth every 4 (four) hours as needed for mild pain or moderate pain.   Lysine 1000 MG Tabs Take 1,000 mg by mouth daily.   multivitamin with minerals tablet Take 1 tablet by mouth daily. Centrum   omeprazole 20 MG capsule Commonly known as: PRILOSEC Take 20 mg by mouth 2 (two) times daily.   polyethylene glycol 17 g packet Commonly known as: MIRALAX / GLYCOLAX Take 17 g by mouth every Monday, Wednesday, and Friday.   tamsulosin 0.4 MG Caps capsule Commonly known as: FLOMAX Take 1 capsule (0.4 mg total) by mouth at bedtime.   Vitamin D3 125 MCG (5000 UT) Tabs Take 5,000 Units by mouth daily.       Allergies:  Allergies  Allergen Reactions  . Other Nausea And Vomiting    Darvocet  . Oxycodone Nausea And Vomiting  . Propoxyphene Nausea And Vomiting    Family History: Family History  Problem Relation Age of Onset  . Hypertension Mother   . Cancer Maternal Grandfather   . Cancer Brother   . Prostate cancer Brother   . Kidney disease Neg Hx   . Bladder Cancer Neg Hx     Social History:  reports that he has never smoked. He has never used smokeless tobacco. He reports that he does not drink alcohol and does not use drugs.  ROS: For pertinent review of systems please refer to history of present illness  Physical Exam: BP (!) 186/73   Pulse 61   Ht $R'5\' 9"'yv$  (1.753 m)   Wt 205 lb (93 kg)   BMI 30.27 kg/m   Constitutional:  Well nourished. Alert and oriented, No acute distress. HEENT: [place] AT, mask in place.  Trachea midline Cardiovascular: No clubbing, cyanosis, or edema. Respiratory: Normal respiratory effort, no increased work of breathing. Neurologic: Grossly intact, no focal deficits, moving all 4 extremities. Psychiatric: Normal mood and affect.   Laboratory  Data: Specimen:  Urine  Ref Range & Units 6 mo ago  Color Yellow Yellow   Clarity Clear Clear   Specific Gravity 1.000 - 1.030 1.020   pH, Urine 5.0 - 8.0 5.5   Protein, Urinalysis Negative, Trace mg/dL Negative   Glucose, Urinalysis Negative mg/dL Negative   Ketones, Urinalysis Negative mg/dL Negative   Blood, Urinalysis Negative Negative   Nitrite, Urinalysis Negative Negative   Leukocyte Esterase, Urinalysis Negative Negative   White Blood Cells, Urinalysis None Seen, 0-3 /hpf 0-3   Red Blood Cells, Urinalysis None Seen, 0-3 /hpf 0-3   Bacteria, Urinalysis None Seen /hpf None Seen   Squamous Epithelial Cells, Urinalysis Rare, Few, None Seen /  hpf None Seen   Resulting Agency  [facility] - LAB  Specimen Collected: 07/28/20 7:36 AM Last Resulted: 07/28/20 9:15 AM  Received From: [facility]  Result Received: 10/09/20 4:53 PM   Specimen:  Blood  Ref Range & Units 6 mo ago  Glucose 70 - 110 mg/dL 182High   Sodium 136 - 145 mmol/L 136   Potassium 3.6 - 5.1 mmol/L 4.5   Chloride 97 - 109 mmol/L 105   Carbon Dioxide (CO2) 22.0 - 32.0 mmol/L 25.7   Urea Nitrogen (BUN) 7 - 25 mg/dL 29High   Creatinine 0.7 - 1.3 mg/dL 1.1   Glomerular Filtration Rate (eGFR), MDRD Estimate >60 mL/min/1.73sq m 64   Calcium 8.7 - 10.3 mg/dL 9.4   AST  8 - 39 U/L 11   ALT  6 - 57 U/L 32   Alk Phos (alkaline Phosphatase) 34 - 104 U/L 60   Albumin 3.5 - 4.8 g/dL 4.0   Bilirubin, Total 0.3 - 1.2 mg/dL 0.5   Protein, Total 6.1 - 7.9 g/dL 6.3   A/G Ratio 1.0 - 5.0 gm/dL 1.7   Resulting Agency  [facility] - LAB  Specimen Collected: 07/28/20 7:36 AM Last Resulted: 07/28/20 9:32 AM  Received From: [facility]    Specimen:  Blood  Ref Range & Units 6 mo ago  WBC (White Blood Cell Count) 4.1 - 10.2 10^3/uL 4.3   RBC (Red Blood Cell Count) 4.69 - 6.13 10^6/uL 3.77Low   Hemoglobin 14.1 - 18.1 gm/dL 11.4Low   Hematocrit 40.0 - 52.0 %  33.0Low   MCV (Mean Corpuscular Volume) 80.0 - 100.0 fl 87.5   MCH (Mean Corpuscular Hemoglobin) 27.0 - 31.2 pg 30.2   MCHC (Mean Corpuscular Hemoglobin Concentration) 32.0 - 36.0 gm/dL 34.5   Platelet Count 150 - 450 10^3/uL 174   RDW-CV (Red Cell Distribution Width) 11.6 - 14.8 % 12.3   MPV (Mean Platelet Volume) 9.4 - 12.4 fl 10.8   Neutrophils 1.50 - 7.80 10^3/uL 2.19   Lymphocytes 1.00 - 3.60 10^3/uL 1.53   Monocytes 0.00 - 1.50 10^3/uL 0.46   Eosinophils 0.00 - 0.55 10^3/uL 0.12   Basophils 0.00 - 0.09 10^3/uL 0.03   Neutrophil % 32.0 - 70.0 % 50.4   Lymphocyte % 10.0 - 50.0 % 35.3   Monocyte % 4.0 - 13.0 % 10.6   Eosinophil % 1.0 - 5.0 % 2.8   Basophil% 0.0 - 2.0 % 0.7   Immature Granulocyte % <=0.7 % 0.2   Immature Granulocyte Count <=0.06 10^3/L 0.01   Resulting Agency  [facility] - LAB  Specimen Collected: 07/28/20 7:36 AM Last Resulted: 07/28/20 8:46 AM  Received From: [facility]  Result Received: 10/09/20 4:53 PM   Specimen:  Blood  Ref Range & Units 6 mo ago  Hemoglobin A1C 4.2 - 5.6 % 8.0High   Average Blood Glucose (Calc) mg/dL 183   Resulting Agency  [facility] - LAB   Narrative  Normal Range:  4.2 - 5.6%  Increased Risk: 5.7 - 6.4%  Diabetes:    >= 6.5%  Glycemic Control for adults with diabetes: <7%  Specimen Collected: 07/28/20 7:36 AM Last Resulted: 07/28/20 1:16 PM  Received From: [facility]  Result Received: 10/09/20 4:53 PM   Lab Results  Component Value Date   PSA1 <0.1 01/22/2021   PSA1 <0.1 07/17/2020   PSA1 <0.1 01/16/2020  I have reviewed the labs.  Pertinent  imaging No recent imaging  Assessment & Plan:     1. Prostate cancer - PSA remains undetectable  2. Nephrolithiasis - Asymptomatic  - CT Urogram pending  3. High risk hematuria - UA negative for micro heme - Patient had an episode of painless gross hematuria a few weeks ago therefore we will go  ahead and repeat CT urogram and cystoscopic as his last work-up was in 2014  4. ED - Discussed ICI with the patient, but he would like to discuss this further with his wife  Patient will return for CT urogram report and cystoscopy with Dr. Erlene Quan for painless gross hematuria.  Zara Council, PA-C  [facility] [address], [place], [place] [address] [phone]

## 2021-01-26 ENCOUNTER — Ambulatory Visit (INDEPENDENT_AMBULATORY_CARE_PROVIDER_SITE_OTHER): Payer: Medicare Other | Admitting: Urology

## 2021-01-26 ENCOUNTER — Encounter: Payer: Self-pay | Admitting: Urology

## 2021-01-26 ENCOUNTER — Other Ambulatory Visit: Payer: Self-pay

## 2021-01-26 VITALS — BP 186/73 | HR 61 | Ht 69.0 in | Wt 205.0 lb

## 2021-01-26 DIAGNOSIS — C61 Malignant neoplasm of prostate: Secondary | ICD-10-CM | POA: Diagnosis not present

## 2021-01-26 DIAGNOSIS — R310 Gross hematuria: Secondary | ICD-10-CM

## 2021-01-26 DIAGNOSIS — R319 Hematuria, unspecified: Secondary | ICD-10-CM

## 2021-01-26 DIAGNOSIS — N200 Calculus of kidney: Secondary | ICD-10-CM | POA: Diagnosis not present

## 2021-01-26 NOTE — Patient Instructions (Signed)

## 2021-02-09 ENCOUNTER — Other Ambulatory Visit: Payer: Self-pay | Admitting: Urology

## 2021-02-09 ENCOUNTER — Ambulatory Visit: Payer: Medicare Other

## 2021-02-09 ENCOUNTER — Other Ambulatory Visit: Payer: Self-pay

## 2021-02-09 ENCOUNTER — Ambulatory Visit
Admission: RE | Admit: 2021-02-09 | Discharge: 2021-02-09 | Disposition: A | Discharge status: Home / Self Care | Payer: Medicare Other | Source: Ambulatory Visit | Attending: Urology | Admitting: Urology

## 2021-02-09 DIAGNOSIS — R310 Gross hematuria: Secondary | ICD-10-CM | POA: Diagnosis not present

## 2021-02-09 LAB — POCT I-STAT CREATININE: Creatinine, Ser: 1.7 mg/dL — ABNORMAL HIGH (ref 0.61–1.24)

## 2021-02-09 MED ORDER — IOHEXOL 300 MG/ML  SOLN
150.0000 mL | Freq: Once | INTRAMUSCULAR | Status: AC | PRN
  Administered 2021-02-09: 125 mL via INTRAVENOUS

## 2021-02-12 ENCOUNTER — Telehealth: Payer: Self-pay

## 2021-02-12 NOTE — Telephone Encounter (Signed)
The tamsulosin should not cause a rise in his creatinine level.  If he has been taking NSAIDS (ibuprophen, Aleve, BC powders, etc), that could worsen his kidney function.  I would stop any of these and have his PCP recheck his levels.  If he hasn't been taking any of these medications, he should contact his PCP to see if any of his other medications are contributing to the rise in his creatinine.  His CT didn't show anything that would cause a rise in creatinine.  The tamsulosin does cause issues with ejaculation.  This is not a dangerous side effect, but if it is really bothersome, we can try a different medication.

## 2021-02-12 NOTE — Telephone Encounter (Signed)
Patient wife called stating some concerns regarding with creatinine level. Patients wife wants to discuss with you if the flomax is affecting that level along with his ejaculation issue.  [phone]

## 2021-02-15 NOTE — Telephone Encounter (Signed)
Patient's wife notified and she voiced understanding. She states they are scheduled to see PCP at the end of March for physicals so she will have them check it again then.

## 2021-02-15 NOTE — Telephone Encounter (Signed)
Pt returned your call.  

## 2021-02-15 NOTE — Telephone Encounter (Signed)
LMOM for patient to return call.

## 2021-02-24 ENCOUNTER — Ambulatory Visit (INDEPENDENT_AMBULATORY_CARE_PROVIDER_SITE_OTHER): Payer: Medicare Other | Admitting: Urology

## 2021-02-24 ENCOUNTER — Encounter: Payer: Self-pay | Admitting: Urology

## 2021-02-24 ENCOUNTER — Other Ambulatory Visit: Payer: Self-pay

## 2021-02-24 VITALS — BP 217/89 | HR 63 | Ht 69.0 in | Wt 200.0 lb

## 2021-02-24 DIAGNOSIS — N179 Acute kidney failure, unspecified: Secondary | ICD-10-CM

## 2021-02-24 DIAGNOSIS — N200 Calculus of kidney: Secondary | ICD-10-CM

## 2021-02-24 DIAGNOSIS — R310 Gross hematuria: Secondary | ICD-10-CM | POA: Diagnosis not present

## 2021-02-24 NOTE — Progress Notes (Signed)
   02/24/21  CC:  Chief Complaint  Patient presents with  . Cysto    HPI: 85 year old male with personal history of prostate cancer status post brachytherapy 2015, kidney stones and intermittent gross hematuria presents today for cystoscopic evaluation.  He underwent CT of the abdomen pelvis noncontrast on 02/10/2021 which showed a small left renal calculus but no other GU pathology.  He was unable to receive IV contrast due to elevated creatinine on the scheduled day of the procedure (1.7).    He mentions that the gross hematuria was one isolated day.  This was about 5 weeks ago without recurrence.  He wonders if he passed a small stone although he did not have any associated flank pain.  Blood pressure (!) 217/89, pulse 63, height 5\' 9"  (1.753 m), weight 200 lb (90.7 kg). NED. A&Ox3.   No respiratory distress   Abd soft, NT, ND Normal phallus with bilateral descended testicles  Cystoscopy Procedure Note  Patient identification was confirmed, informed consent was obtained, and patient was prepped using Betadine solution.  Lidocaine jelly was administered per urethral meatus.     Pre-Procedure: - Inspection reveals a normal caliber ureteral meatus.  Procedure: The flexible cystoscope was introduced without difficulty - No urethral strictures/lesions are present. - Normal prostate  - Normal bladder neck - Bilateral ureteral orifices identified - Bladder mucosa  reveals no ulcers, tumors, or lesions - No bladder stones - Minimal/ mild trabeculation  Retroflexion shows unremarkable   Post-Procedure: - Patient tolerated the procedure well  Assessment/ Plan:  1. Gross hematuria \\Etiology  of gross hematuria unclear, has not had a recurrence and lower tract in the form of cystoscopy today is unremarkable  Plan to recheck his urine in 6 months, if his microscopic hematuria persists or worsens, consider CT urogram if his creatinine normalizes - Urinalysis, Complete  2.  Kidney stones Small nonobstructing stone, asymptomatic  No further recommendation for intervention at this time, will follow clinically - Basic metabolic panel  3. AKI (acute kidney injury) ([place]) Bump in creatinine up to 1.7, unable to receive IV contrast at the time of CT urogram  Recommended repeating this today to ensure that is trending back down to baseline, if not he is to follow-up with his PCP   F/u Zara Council in 6 months for UA   Hollice Espy, MD

## 2021-02-25 ENCOUNTER — Telehealth: Payer: Self-pay | Admitting: *Deleted

## 2021-02-25 LAB — BASIC METABOLIC PANEL
BUN/Creatinine Ratio: 20 (ref 10–24)
BUN: 26 mg/dL (ref 8–27)
CO2: 16 mmol/L — ABNORMAL LOW (ref 20–29)
Calcium: 9.9 mg/dL (ref 8.6–10.2)
Chloride: 103 mmol/L (ref 96–106)
Creatinine, Ser: 1.33 mg/dL — ABNORMAL HIGH (ref 0.76–1.27)
Glucose: 198 mg/dL — ABNORMAL HIGH (ref 65–99)
Potassium: 5.2 mmol/L (ref 3.5–5.2)
Sodium: 137 mmol/L (ref 134–144)
eGFR: 53 mL/min/{1.73_m2} — ABNORMAL LOW (ref >59)

## 2021-02-25 LAB — MICROSCOPIC EXAMINATION: Bacteria, UA: NONE SEEN

## 2021-02-25 LAB — URINALYSIS, COMPLETE
Bilirubin, UA: NEGATIVE
Glucose, UA: NEGATIVE
Ketones, UA: NEGATIVE
Leukocytes,UA: NEGATIVE
Nitrite, UA: NEGATIVE
Protein,UA: NEGATIVE
Specific Gravity, UA: 1.02 (ref 1.005–1.030)
Urobilinogen, Ur: 0.2 mg/dL (ref 0.2–1.0)
pH, UA: 5 (ref 5.0–7.5)

## 2021-02-25 NOTE — Telephone Encounter (Addendum)
Patient aware, voiced understanding.   ----- Message from Hollice Espy, MD sent at 02/25/2021  8:16 AM EST ----- Cr is going back down, good news.  Hollice Espy, MD

## 2021-07-13 IMAGING — CR DG ABDOMEN 1V
1 series · 2 of 2 positions shown · non-contrast
Comparison: 07/02/2018

CLINICAL DATA: Follow-up kidney stones

EXAM:
ABDOMEN - 1 VIEW

[Series 1: dg abd 1 view · 0.14mm/px · 2 of 2 slices shown]
[im 1/2]
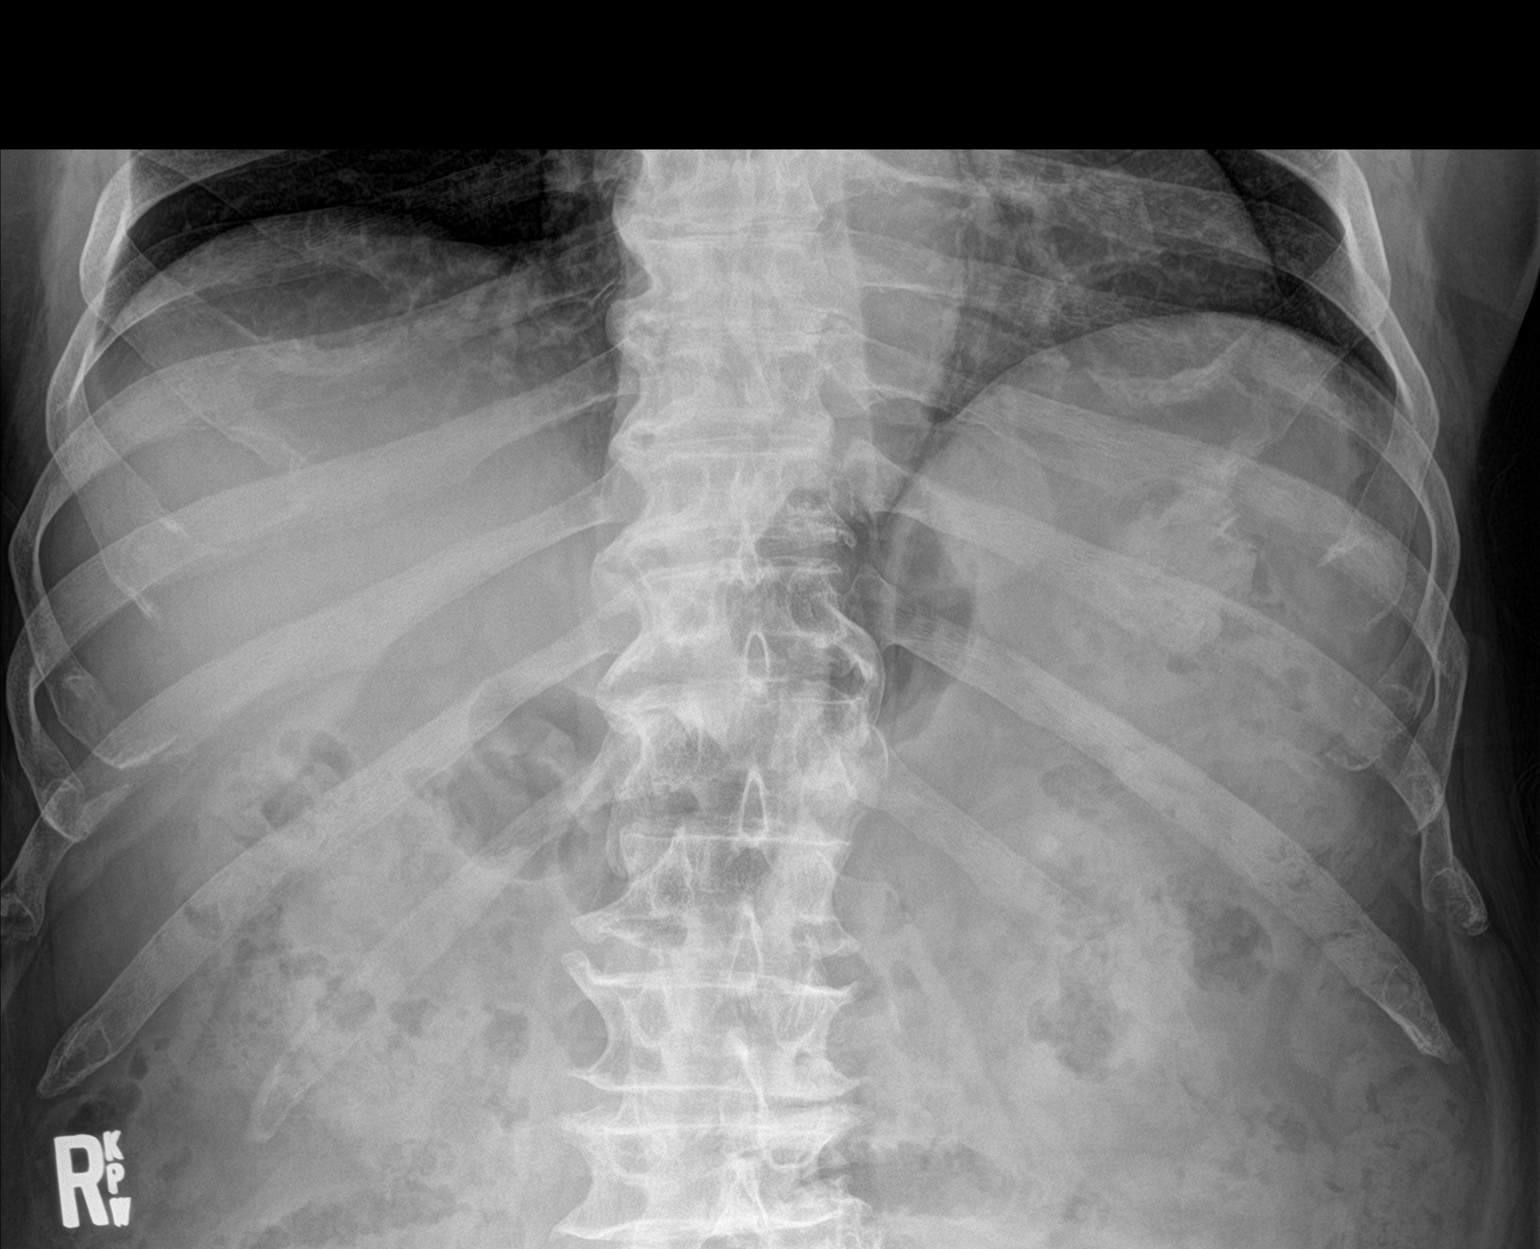
[im 2/2]
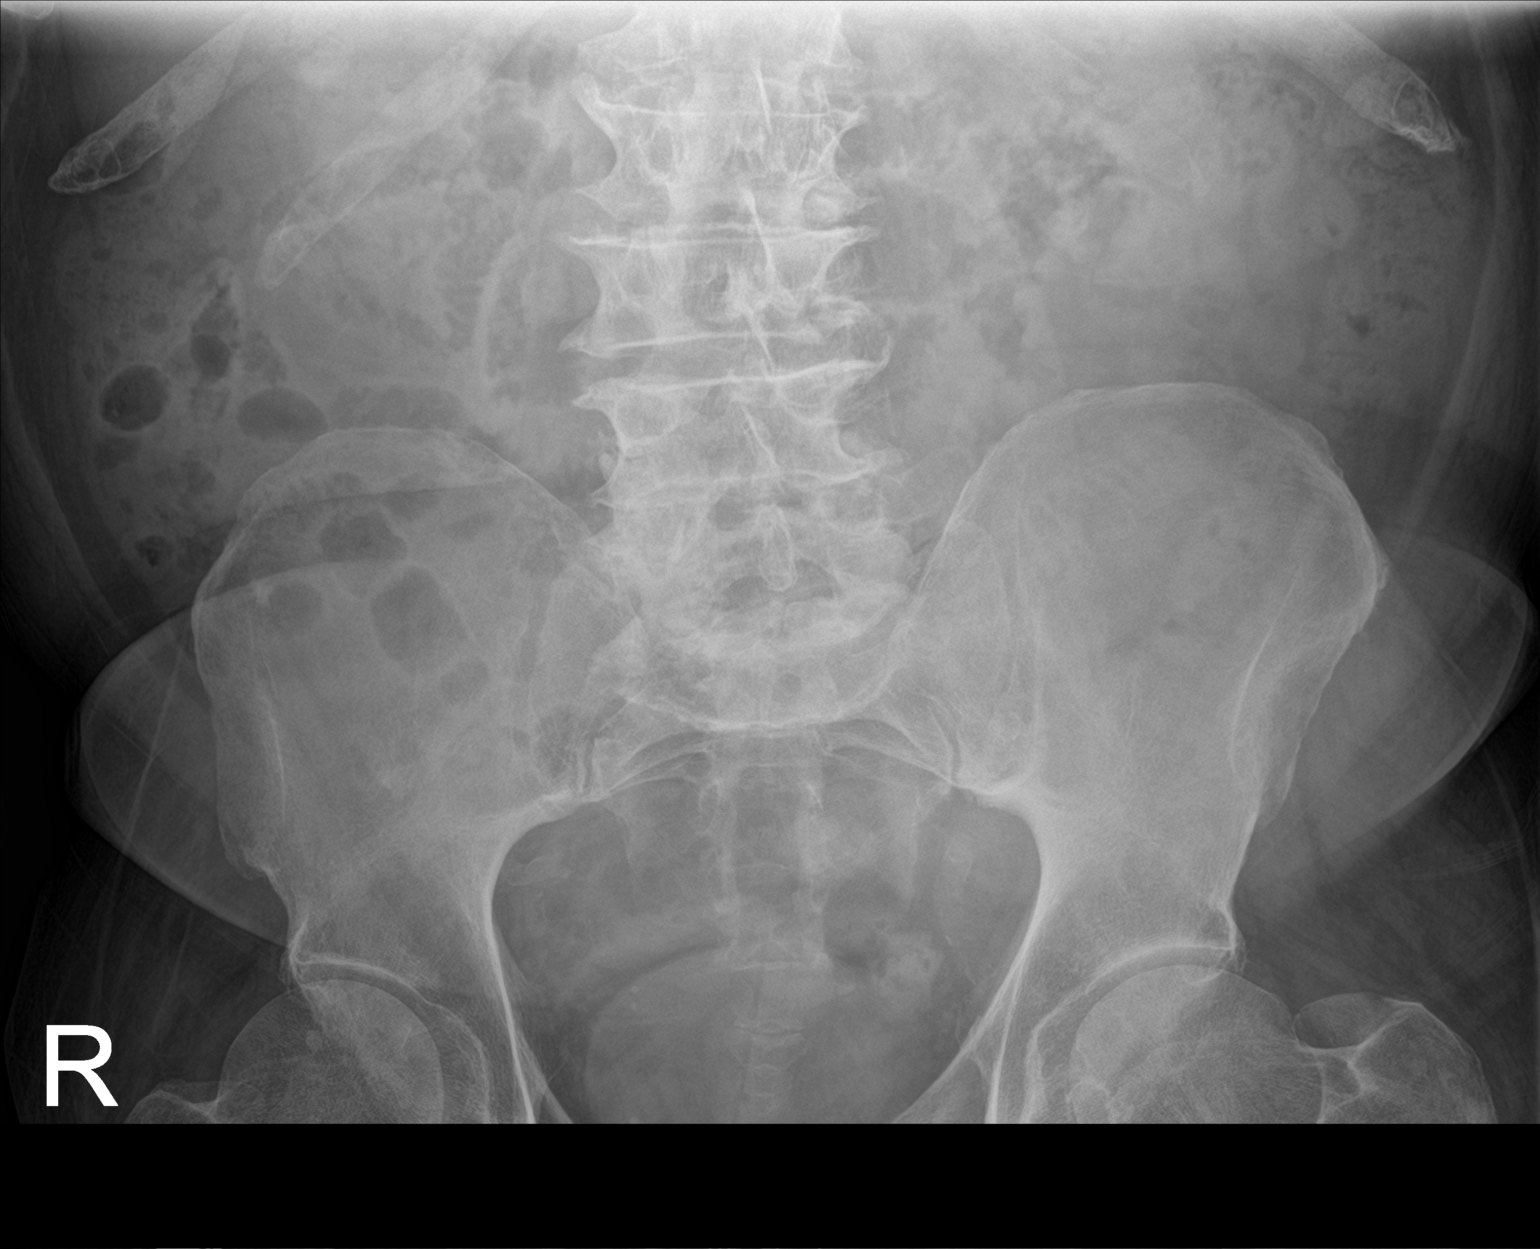

[2 of 2 positions shown; findings below may reference images not displayed]

FINDINGS: Scattered large and small bowel gas is noted. Previously seen left
renal stone is again identified and stable. No definitive right
renal calculi are seen. Prostate therapy seeds are noted.
Degenerative changes of lumbar spine are noted.
IMPRESSION: Stable left renal stone.  No new focal abnormality is noted.

## 2021-08-17 ENCOUNTER — Other Ambulatory Visit: Payer: Self-pay | Admitting: Urology

## 2021-08-17 DIAGNOSIS — N200 Calculus of kidney: Secondary | ICD-10-CM

## 2021-08-30 NOTE — Progress Notes (Deleted)
01/02/2019 9:46 AM   Alex Lang [birth year] [id number]  Referring provider: Baxter Hire, MD [place],  [place] [address]  Urological history: 1. cT2a Prostate cancer - PSA pending - Dx in 10/2014 with Gleason 3+4, 3+3 prostate cancer involving 8 of 13 cores, iPSA 4.8 DRE also somewhat concerning for cancer with questionable induration at right base. TRUS vol 45 cc - underwent I-125 brachytherapy seed placement on 01/26/2015  - received adjunctive ADT therapy for 4-6 months  2. Nephrolithiasis - stone composition 30% calcium oxalate monohydrate and 70% uric acid - 24 hour metabolic work up - low urine volume, high urine pH, increased uric acid secretion and increased uric acid stone risk - left renal stones seen on non-contrast CT 01/2021  3. High risk hematuria - work up completed 2014 - CTU, cysto in OR - NED -non-contrast CT 01/2021 - stones -cysto 01/2021 NED -no reports of gross hematuria -UA neg for micro heme 07/2021  4. ED - failed PDE5i's - not interested in further treatment  No chief complaint on file.   HPI: Alex Lang is 85 y.o. male who presents today for six-month follow-up.     PMH: Past Medical History:  Diagnosis Date   Acid reflux    BP (high blood pressure) 10/02/2014   BPH (benign prostatic hyperplasia)    Elevated PSA    Erectile dysfunction    GERD (gastroesophageal reflux disease)    Gross hematuria    History of hiatal hernia    History of kidney stones    Hypertension    Hypogonadism in male    Kidney stone    bilateral   Kidney stones 06/21/2015   Microscopic hematuria    Nephrolithiasis 2016   Pneumonia    PONV (postoperative nausea and vomiting)    Pre-diabetes    Prostate cancer ([place])    Renal stones 08/11/2015    Surgical History: Past Surgical History:  Procedure Laterality Date   EXTRACORPOREAL SHOCK WAVE LITHOTRIPSY Right 07/23/2015   Procedure: EXTRACORPOREAL SHOCK WAVE  LITHOTRIPSY (ESWL);  Surgeon: Hollice Espy, MD;  Location: [facility] ORS;  Service: Urology;  Laterality: Right;   EXTRACORPOREAL SHOCK WAVE LITHOTRIPSY Left 09/03/2015   Procedure: EXTRACORPOREAL SHOCK WAVE LITHOTRIPSY (ESWL);  Surgeon: Nickie Retort, MD;  Location: [facility] ORS;  Service: Urology;  Laterality: Left;   extracorporeal shockwave lithotripsy  2016   KIDNEY STONE SURGERY     laser lithotripsy and stent placement  2005   ORTHOPEDIC SURGERY Left    torn meniscus   PROSTATE SURGERY     seeds   XI ROBOTIC ASSISTED INGUINAL HERNIA REPAIR WITH MESH Bilateral 02/05/2020   Procedure: XI ROBOTIC ASSISTED INGUINAL HERNIA REPAIR WITH MESH;  Surgeon: Herbert Pun, MD;  Location: [facility] ORS;  Service: General;  Laterality: Bilateral;    Home Medications:  Allergies as of 08/31/2021       Reactions   Other Nausea And Vomiting   Darvocet   Oxycodone Nausea And Vomiting   Propoxyphene Nausea And Vomiting        Medication List        Accurate as of August 30, 2021  9:46 AM. If you have any questions, ask your nurse or doctor.          amLODipine 5 MG tablet Commonly known as: NORVASC Take 5 mg by mouth daily.   benazepril 10 MG tablet Commonly known as: LOTENSIN Take 10 mg by mouth 2 (two) times daily.  calcium carbonate 600 MG Tabs tablet Commonly known as: OS-CAL Take 600 mg by mouth daily.   esomeprazole 10 MG packet Commonly known as: NEXIUM Take by mouth.   FISH OIL + D3 PO Take 1,200 mg by mouth daily.   Ginkgo Biloba 40 MG Tabs Take 120 mg by mouth daily.   ibuprofen 200 MG tablet Commonly known as: ADVIL Take 200-400 mg by mouth every 4 (four) hours as needed for mild pain or moderate pain.   Lysine 1000 MG Tabs Take 1,000 mg by mouth daily.   multivitamin with minerals tablet Take 1 tablet by mouth daily. Centrum   omeprazole 20 MG capsule Commonly known as: PRILOSEC Take 20 mg by mouth 2 (two) times daily.   polyethylene glycol 17  g packet Commonly known as: MIRALAX / GLYCOLAX Take 17 g by mouth every Monday, Wednesday, and Friday.   tamsulosin 0.4 MG Caps capsule Commonly known as: FLOMAX TAKE 1 CAPSULE BY MOUTH EVERYDAY AT BEDTIME   Vitamin D3 125 MCG (5000 UT) Tabs Take 5,000 Units by mouth daily.        Allergies:  Allergies  Allergen Reactions   Other Nausea And Vomiting    Darvocet   Oxycodone Nausea And Vomiting   Propoxyphene Nausea And Vomiting    Family History: Family History  Problem Relation Age of Onset   Hypertension Mother    Cancer Maternal Grandfather    Cancer Brother    Prostate cancer Brother    Kidney disease Neg Hx    Bladder Cancer Neg Hx     Social History:  reports that he has never smoked. He has never used smokeless tobacco. He reports that he does not drink alcohol and does not use drugs.  ROS: For pertinent review of systems please refer to history of present illness  Physical Exam: There were no vitals taken for this visit.  Constitutional:  Well nourished. Alert and oriented, No acute distress. HEENT: [place] AT, mask in place.  Trachea midline Cardiovascular: No clubbing, cyanosis, or edema. Respiratory: Normal respiratory effort, no increased work of breathing. Neurologic: Grossly intact, no focal deficits, moving all 4 extremities. Psychiatric: Normal mood and affect.   Laboratory Data: Thyroid Stimulating Hormone (TSH) 0.450-5.330 uIU/ml uIU/mL 4.604   Resulting Agency  [facility]  Specimen Collected: 08/06/21 07:27 Last Resulted: 08/06/21 13:13  Received From: [facility]  Result Received: 08/18/21 09:53   Hemoglobin A1C 4.2 - 5.6 % 7.1 High    Average Blood Glucose (Calc) mg/dL 824   Resulting Agency  [facility] - [facility]  Narrative Performed by [facility] Normal Range:    4.2 - 5.6%  Increased Risk:  5.7 - 6.4%  Diabetes:        >= 6.5%  Glycemic Control for adults with diabetes:  <7%    Specimen Collected: 08/06/21 07:27 Last Resulted: 08/06/21 10:04  Received From: [facility]  Result Received: 08/18/21 09:53   Color Yellow, Violet, Light Violet, Dark Violet Yellow   Clarity Clear, Other Clear   Specific Gravity 1.000 - 1.030 1.025   pH, Urine 5.0 - 8.0 6.0   Protein, Urinalysis Negative, Trace mg/dL Negative   Glucose, Urinalysis Negative mg/dL Negative   Ketones, Urinalysis Negative mg/dL Negative   Blood, Urinalysis Negative Negative   Nitrite, Urinalysis Negative Negative   Leukocyte Esterase, Urinalysis Negative Negative   White Blood Cells, Urinalysis None Seen, 0-3 /hpf None Seen  Red Blood Cells, Urinalysis None Seen, 0-3 /hpf None Seen   Bacteria, Urinalysis None Seen /hpf None Seen   Resulting Agency  [facility] - LAB  Specimen Collected: 08/06/21 07:27 Last Resulted: 08/06/21 08:24  Received From: [facility]  Result Received: 08/18/21 09:53   Cholesterol, Total 100 - 200 mg/dL 158   Triglyceride 35 - 199 mg/dL 181   HDL (High Density Lipoprotein) Cholesterol 29.0 - 71.0 mg/dL 27.2 Low    LDL Calculated 0 - 130 mg/dL 95   VLDL Cholesterol mg/dL 36   Cholesterol/HDL Ratio  5.8   Resulting [facility] - LAB  Specimen Collected: 08/06/21 07:27 Last Resulted: 08/06/21 13:13  Received From: [facility]  Result Received: 08/18/21 09:53   Glucose 70 - 110 mg/dL 167 High    Sodium 136 - 145 mmol/L 137   Potassium 3.6 - 5.1 mmol/L 4.7   Chloride 97 - 109 mmol/L 106   Carbon Dioxide (CO2) 22.0 - 32.0 mmol/L 24.4   Calcium 8.7 - 10.3 mg/dL 9.8   Urea Nitrogen (BUN) 7 - 25 mg/dL 37 High    Creatinine 0.7 - 1.3 mg/dL 1.4 High    Glomerular Filtration Rate (eGFR), MDRD Estimate >60 mL/min/1.73sq m 49 Low    BUN/Crea Ratio 6.0 - 20.0 26.4 High    Anion Gap w/K 6.0 - 16.0 11.3   Resulting Agency  [facility] - LAB  Specimen Collected: 08/06/21 07:27 Last Resulted:  08/06/21 13:18  Received From: [facility]  Result Received: 08/18/21 09:53  I have reviewed the labs.  Pertinent imaging No recent imaging  Assessment & Plan:     1. Prostate cancer - PSA pending  2. Nephrolithiasis - Asymptomatic  - Continue to follow clinically   3. High risk hematuria - negative work up in 2014 -non-contrast CT and cysto negative in 2022 -no reports of gross heme -UA in 07/2021 negative for micro heme -would recommend CT urogram if reports further gross heme, has micro heme on repeat UA and serum creatinine normalizes   4. ED - Discussed ICI with the patient, but he would like to discuss this further with his wife  Zara Council, [facility]  [place] [place] [address] [place], [place] [address] [phone]

## 2021-08-31 ENCOUNTER — Ambulatory Visit: Payer: Medicare Other | Admitting: Urology

## 2021-08-31 DIAGNOSIS — N200 Calculus of kidney: Secondary | ICD-10-CM

## 2021-08-31 DIAGNOSIS — C61 Malignant neoplasm of prostate: Secondary | ICD-10-CM

## 2021-08-31 DIAGNOSIS — R319 Hematuria, unspecified: Secondary | ICD-10-CM

## 2021-09-01 NOTE — Progress Notes (Signed)
09/02/21 2:07 PM   Alex Lang 17-Jul-1936 [id number]  Referring provider:  Baxter Hire, MD [place],  [place] [address]  Chief Complaint  Patient presents with   Prostate Cancer    Urological history  1. cT2a Prostate cancer - PSA <0.1 01/22/2021 - Dx in 10/2014 with Gleason 3+4, 3+3 prostate cancer involving 8 of 13 cores, iPSA 4.8 DRE also somewhat concerning for cancer with questionable induration at right base. TRUS vol 45 cc - underwent I-125 brachytherapy seed placement on 01/26/2015  - received adjunctive ADT therapy for 4-6 months   2. Nephrolithiasis - stone composition 30% calcium oxalate monohydrate and 70% uric acid - 24 hour metabolic work up - low urine volume, high urine pH, increased uric acid secretion and increased uric acid stone risk - CT on 02/09/2021 revealed small left renal calculi with no evidence of ureteral calculi, hydronephrosis, or other acute findings.    3. High risk hematuria -non-smoker - work up completed 2014 - CTU, cysto in OR - NED -no reports of gross heme -UA negative for micro heme in 07/2021  4. ED -contributing factors of age, prostate cancer, pelvic radiation, HTN, diabetes and HLD - failed PDE5i's - not interested in further treatment   HPI: Alex Lang is a 85 y.o.male with a history of prostate cancer, nephrolithiasis, high risk hematuria, and ED,  who presents today for follow-up with UA.   Urinalysis was negative in August.   He is doing well today and is accompanied by is wife today. He has recently lost 25 lbs.   He denies any modifying or aggravating factors.  Patient denies any gross hematuria, dysuria or suprapubic/flank pain.  Patient denies any fevers, chills, nausea or vomiting.  He has not had any recent stones.   He continues to take tamsulosin 0.4 mg daily.   PMH: Past Medical History:  Diagnosis Date   Acid reflux    BP (high blood pressure) 10/02/2014   BPH (benign  prostatic hyperplasia)    Elevated PSA    Erectile dysfunction    GERD (gastroesophageal reflux disease)    Gross hematuria    History of hiatal hernia    History of kidney stones    Hypertension    Hypogonadism in male    Kidney stone    bilateral   Kidney stones 06/21/2015   Microscopic hematuria    Nephrolithiasis 2016   Pneumonia    PONV (postoperative nausea and vomiting)    Pre-diabetes    Prostate cancer ([place])    Renal stones 08/11/2015    Surgical History: Past Surgical History:  Procedure Laterality Date   EXTRACORPOREAL SHOCK WAVE LITHOTRIPSY Right 07/23/2015   Procedure: EXTRACORPOREAL SHOCK WAVE LITHOTRIPSY (ESWL);  Surgeon: Alex Espy, MD;  Location: [facility] ORS;  Service: Urology;  Laterality: Right;   EXTRACORPOREAL SHOCK WAVE LITHOTRIPSY Left 09/03/2015   Procedure: EXTRACORPOREAL SHOCK WAVE LITHOTRIPSY (ESWL);  Surgeon: Alex Retort, MD;  Location: [facility] ORS;  Service: Urology;  Laterality: Left;   extracorporeal shockwave lithotripsy  2016   KIDNEY STONE SURGERY     laser lithotripsy and stent placement  2005   ORTHOPEDIC SURGERY Left    torn meniscus   PROSTATE SURGERY     seeds   XI ROBOTIC ASSISTED INGUINAL HERNIA REPAIR WITH MESH Bilateral 02/05/2020   Procedure: XI ROBOTIC ASSISTED INGUINAL HERNIA REPAIR WITH MESH;  Surgeon: Alex Pun, MD;  Location: [facility] ORS;  Service: General;  Laterality: Bilateral;  Home Medications:  Allergies as of 09/02/2021       Reactions   Other Nausea And Vomiting   Darvocet   Oxycodone Nausea And Vomiting   Propoxyphene Nausea And Vomiting        Medication List        Accurate as of September 02, 2021  2:07 PM. If you have any questions, ask your nurse or doctor.          amLODipine 5 MG tablet Commonly known as: NORVASC Take 5 mg by mouth daily.   benazepril 10 MG tablet Commonly known as: LOTENSIN Take 10 mg by mouth 2 (two) times daily.   calcium carbonate 600 MG Tabs  tablet Commonly known as: OS-CAL Take 600 mg by mouth daily.   esomeprazole 10 MG packet Commonly known as: NEXIUM Take by mouth.   FISH OIL + D3 PO Take 1,200 mg by mouth daily.   Ginkgo Biloba 40 MG Tabs Take 120 mg by mouth daily.   ibuprofen 200 MG tablet Commonly known as: ADVIL Take 200-400 mg by mouth every 4 (four) hours as needed for mild pain or moderate pain.   Januvia 25 MG tablet Generic drug: sitaGLIPtin Take 25 mg by mouth daily.   levothyroxine 25 MCG tablet Commonly known as: SYNTHROID Take 25 mcg by mouth every morning.   Lysine 1000 MG Tabs Take 1,000 mg by mouth daily.   multivitamin with minerals tablet Take 1 tablet by mouth daily. Centrum   omeprazole 20 MG capsule Commonly known as: PRILOSEC Take 20 mg by mouth 2 (two) times daily.   omeprazole 20 MG capsule Commonly known as: PRILOSEC Take 1 capsule by mouth 2 (two) times daily.   polyethylene glycol 17 g packet Commonly known as: MIRALAX / GLYCOLAX Take 17 g by mouth every Monday, Wednesday, and Friday.   tamsulosin 0.4 MG Caps capsule Commonly known as: FLOMAX TAKE 1 CAPSULE BY MOUTH EVERYDAY AT BEDTIME   Vitamin D3 125 MCG (5000 UT) Tabs Take 5,000 Units by mouth daily.        Allergies:  Allergies  Allergen Reactions   Other Nausea And Vomiting    Darvocet   Oxycodone Nausea And Vomiting   Propoxyphene Nausea And Vomiting    Family History: Family History  Problem Relation Age of Onset   Hypertension Mother    Cancer Maternal Grandfather    Cancer Brother    Prostate cancer Brother    Kidney disease Neg Hx    Bladder Cancer Neg Hx     Social History:  reports that he has never smoked. He has never used smokeless tobacco. He reports that he does not drink alcohol and does not use drugs.   Physical Exam: BP (!) 172/70   Pulse 65   Ht $R'5\' 9"'xl$  (1.753 m)   Wt 192 lb (87.1 kg)   BMI 28.35 kg/m   Constitutional:  Alert and oriented, No acute distress. HEENT: [place]  AT, mask in place  Trachea midline Cardiovascular: No clubbing, cyanosis, or edema. Respiratory: Normal respiratory effort, no increased work of breathing. Neurologic: Grossly intact, no focal deficits, moving all 4 extremities. Psychiatric: Normal mood and affect.  Laboratory Data: Lab Results  Component Value Date   CREATININE 1.33 (H) 02/24/2021    Ref Range & Units 3 wk ago  Glucose 70 - 110 mg/dL 167 High    Sodium 136 - 145 mmol/L 137   Potassium 3.6 - 5.1 mmol/L 4.7   Chloride 97 - 109  mmol/L 106   Carbon Dioxide (CO2) 22.0 - 32.0 mmol/L 24.4   Calcium 8.7 - 10.3 mg/dL 9.8   Urea Nitrogen (BUN) 7 - 25 mg/dL 37 High    Creatinine 0.7 - 1.3 mg/dL 1.4 High    Glomerular Filtration Rate (eGFR), MDRD Estimate >60 mL/min/1.73sq m 49 Low    BUN/Crea Ratio 6.0 - 20.0 26.4 High    Anion Gap w/K 6.0 - 16.0 11.3   Resulting Agency  [facility] - [facility]  Specimen Collected: 08/06/21 07:27 Last Resulted: 08/06/21 13:18  Received From: [place]  Result Received: 08/18/21 09:53     Ref Range & Units 3 wk ago  Hemoglobin A1C 4.2 - 5.6 % 7.1 High    Average Blood Glucose (Calc) mg/dL 157   Resulting Agency  [facility] - [facility]  Narrative Performed by [facility] - LAB Normal Range:    4.2 - 5.6%  Increased Risk:  5.7 - 6.4%  Diabetes:        >= 6.5%  Glycemic Control for adults with diabetes:  <7%   Specimen Collected: 08/06/21 07:27 Last Resulted: 08/06/21 10:04  Received From: [facility]  Result Received: 08/18/21 09:53    Ref Range & Units 3 wk ago  Cholesterol, Total 100 - 200 mg/dL 158   Triglyceride 35 - 199 mg/dL 181   HDL (High Density Lipoprotein) Cholesterol 29.0 - 71.0 mg/dL 27.2 Low    LDL Calculated 0 - 130 mg/dL 95   VLDL Cholesterol mg/dL 36   Cholesterol/HDL Ratio  5.8   Resulting [facility] - LAB  Specimen Collected: 08/06/21 07:27 Last Resulted: 08/06/21 13:13  Received From: [place] [facility] [facility]  Result Received: 08/18/21 09:53    Urinalysis  Ref Range & Units 3 wk ago  Color Yellow, Violet, Light Violet, Dark Violet Yellow   Clarity Clear, Other Clear   Specific Gravity 1.000 - 1.030 1.025   pH, Urine 5.0 - 8.0 6.0   Protein, Urinalysis Negative, Trace mg/dL Negative   Glucose, Urinalysis Negative mg/dL Negative   Ketones, Urinalysis Negative mg/dL Negative   Blood, Urinalysis Negative Negative   Nitrite, Urinalysis Negative Negative   Leukocyte Esterase, Urinalysis Negative Negative   White Blood Cells, Urinalysis None Seen, 0-3 /hpf None Seen   Red Blood Cells, Urinalysis None Seen, 0-3 /hpf None Seen   Bacteria, Urinalysis None Seen /hpf None Seen   Resulting Agency  [facility] - [facility]  Specimen Collected: 08/06/21 07:27 Last Resulted: 08/06/21 08:24  Received From: [place]  Result Received: 08/18/21 09:53  I have reviewed the labs.   Pertinent Imaging: CLINICAL DATA:  Gross hematuria. Nephrolithiasis. Personal history of prostate carcinoma.   EXAM: CT ABDOMEN AND PELVIS WITHOUT CONTRAST   TECHNIQUE: Multidetector CT imaging of the abdomen and pelvis was performed following the standard protocol without IV contrast.   COMPARISON:  01/08/2018   FINDINGS: Lower chest: No acute findings. Aortic and coronary atherosclerotic calcification noted.   Hepatobiliary: Several tiny low-attenuation lesions in the right and left lobes cannot be characterized on this unenhanced study, but are stable and consistent with benign etiologies. No new or enlarging liver lesions seen on this unenhanced exam. Gallbladder is unremarkable. No evidence of biliary ductal dilatation.   Pancreas: No mass or inflammatory process visualized on this unenhanced exam.   Spleen:  Within normal limits in size.   Adrenals/Urinary tract: Several small left renal calculi  again seen, largest in the upper pole measuring 5 mm. No evidence  of ureteral calculi or hydronephrosis. Stable fluid attenuation subcapsular cyst in medial lower pole of left kidney. Unremarkable unopacified urinary bladder.   Stomach/Bowel: Small hiatal hernia noted. No evidence of obstruction, inflammatory process, or abnormal fluid collections.   Vascular/Lymphatic: No pathologically enlarged lymph nodes identified. No evidence of abdominal aortic aneurysm. Aortic atherosclerotic calcification noted.   Reproductive: Brachytherapy seeds again seen throughout prostate bed. Symmetric seminal vesicles.   Other:  None.   Musculoskeletal:  No suspicious bone lesions identified.   IMPRESSION: Small left renal calculi. No evidence of ureteral calculi, hydronephrosis, or other acute findings.   Small hiatal hernia.   No evidence of metastatic prostate carcinoma.   Aortic Atherosclerosis (ICD10-I70.0).     Electronically Signed   By: Marlaine Hind M.D.   On: 02/10/2021 12:24  I have independently reviewed the films.  See HPI.    Assessment & Plan:    Prostate cancer - PSA; pending  Nephrolithiasis  - Asymptomatic -continue tamsulosin 0.4 mg daily    High risk hematuria  - work up 2014 - neg - no reports of gross heme - UA negative for microscopic hematuria   Return in about 1 year (around 09/02/2022) for PSA, I PSS and UA .  [place] [address] [place], [place] [address] [phone]  [facility] as a scribe for [facility], PA-C.,have documented all relevant documentation on the behalf of Artrice Kraker, PA-C,as directed by  [facility], PA-C while in the presence of Harmani Neto, PA-C.   I have reviewed the above documentation for accuracy and completeness, and I agree with the above.    Zara Council, PA-C

## 2021-09-02 ENCOUNTER — Ambulatory Visit (INDEPENDENT_AMBULATORY_CARE_PROVIDER_SITE_OTHER): Payer: Medicare Other | Admitting: Urology

## 2021-09-02 ENCOUNTER — Other Ambulatory Visit: Payer: Self-pay

## 2021-09-02 VITALS — BP 172/70 | HR 65 | Ht 69.0 in | Wt 192.0 lb

## 2021-09-02 DIAGNOSIS — R319 Hematuria, unspecified: Secondary | ICD-10-CM

## 2021-09-02 DIAGNOSIS — N200 Calculus of kidney: Secondary | ICD-10-CM

## 2021-09-02 DIAGNOSIS — C61 Malignant neoplasm of prostate: Secondary | ICD-10-CM

## 2021-09-03 LAB — PSA: Prostate Specific Ag, Serum: <0.1 ng/mL (ref 0.0–4.0)

## 2021-09-13 ENCOUNTER — Encounter: Payer: Self-pay | Admitting: Urology

## 2022-08-25 ENCOUNTER — Other Ambulatory Visit: Payer: Self-pay | Admitting: *Deleted

## 2022-08-25 ENCOUNTER — Other Ambulatory Visit: Payer: Self-pay | Admitting: Urology

## 2022-08-25 DIAGNOSIS — N200 Calculus of kidney: Secondary | ICD-10-CM

## 2022-08-25 DIAGNOSIS — C61 Malignant neoplasm of prostate: Secondary | ICD-10-CM

## 2022-08-26 ENCOUNTER — Other Ambulatory Visit: Payer: Medicare Other

## 2022-08-26 DIAGNOSIS — C61 Malignant neoplasm of prostate: Secondary | ICD-10-CM

## 2022-08-27 LAB — PSA: Prostate Specific Ag, Serum: <0.1 ng/mL (ref 0.0–4.0)

## 2022-09-01 NOTE — Progress Notes (Unsigned)
09/02/22 9:36 AM   [name] 17-Apr-1936 [id number]  Referring provider:  Baxter Hire, MD [place],  [place] [address]  Urological history  1. cT2a Prostate cancer - PSA (08/2022) <0.1 - Dx in 10/2014 with Gleason 3+4, 3+3 prostate cancer involving 8 of 13 cores, iPSA 4.8 DRE also somewhat concerning for cancer with questionable induration at right base. TRUS vol 45 cc - underwent I-125 brachytherapy seed placement on 01/26/2015  - received adjunctive ADT therapy for 4-6 months   2. Nephrolithiasis - stone composition 30% calcium oxalate monohydrate and 70% uric acid - 24 hour metabolic work up - low urine volume, high urine pH, increased uric acid secretion and increased uric acid stone risk - CT on 02/09/2021 revealed small left renal calculi with no evidence of ureteral calculi, hydronephrosis, or other acute findings.    3. High risk hematuria -non-smoker -work up completed 2014 - CTU, cysto in OR - NED -no reports of gross heme -UA (07/2022) negative for micro heme  4. ED -contributing factors of age, prostate cancer, pelvic radiation, HTN, diabetes and HLD - failed PDE5i's - not interested in further treatment   HPI: Alex Lang is a 86 y.o.male with a history of prostate cancer, nephrolithiasis, high risk hematuria, and ED,  who presents today for follow-up.  I PSS 0/0  He has no urinary complaints.  Patient denies any modifying or aggravating factors.  Patient denies any gross hematuria, dysuria or suprapubic/flank pain.  Patient denies any fevers, chills, nausea or vomiting.     IPSS     Row Name 09/02/22 0900         International Prostate Symptom Score   How often have you had the sensation of not emptying your bladder? Not at All     How often have you had to urinate less than every two hours? Not at All     How often have you found you stopped and started again several times when you urinated? Not at All     How  often have you found it difficult to postpone urination? Not at All     How often have you had a weak urinary stream? Not at All     How often have you had to strain to start urination? Not at All     How many times did you typically get up at night to urinate? None     Total IPSS Score 0       Quality of Life due to urinary symptoms   If you were to spend the rest of your life with your urinary condition just the way it is now how would you feel about that? Delighted              Score:  1-7 Mild 8-19 Moderate 20-35 Severe   PMH: Past Medical History:  Diagnosis Date   Acid reflux    BP (high blood pressure) 10/02/2014   BPH (benign prostatic hyperplasia)    Elevated PSA    Erectile dysfunction    GERD (gastroesophageal reflux disease)    Gross hematuria    History of hiatal hernia    History of kidney stones    Hypertension    Hypogonadism in male    Kidney stone    bilateral   Kidney stones 06/21/2015   Microscopic hematuria    Nephrolithiasis 2016   Pneumonia    PONV (postoperative nausea and vomiting)    Pre-diabetes  Prostate cancer [facility])    Renal stones 08/11/2015    Surgical History: Past Surgical History:  Procedure Laterality Date   EXTRACORPOREAL SHOCK WAVE LITHOTRIPSY Right 07/23/2015   Procedure: EXTRACORPOREAL SHOCK WAVE LITHOTRIPSY (ESWL);  Surgeon: Hollice Espy, MD;  Location: [facility] ORS;  Service: Urology;  Laterality: Right;   EXTRACORPOREAL SHOCK WAVE LITHOTRIPSY Left 09/03/2015   Procedure: EXTRACORPOREAL SHOCK WAVE LITHOTRIPSY (ESWL);  Surgeon: Nickie Retort, MD;  Location: [facility] ORS;  Service: Urology;  Laterality: Left;   extracorporeal shockwave lithotripsy  2016   KIDNEY STONE SURGERY     laser lithotripsy and stent placement  2005   ORTHOPEDIC SURGERY Left    torn meniscus   PROSTATE SURGERY     seeds   XI ROBOTIC ASSISTED INGUINAL HERNIA REPAIR WITH MESH Bilateral 02/05/2020   Procedure: XI ROBOTIC ASSISTED INGUINAL HERNIA REPAIR  WITH MESH;  Surgeon: Herbert Pun, MD;  Location: [facility] ORS;  Service: General;  Laterality: Bilateral;    Home Medications:  Allergies as of 09/02/2022       Reactions   Other Nausea And Vomiting   Darvocet   Oxycodone Nausea And Vomiting   Propoxyphene Nausea And Vomiting        Medication List        Accurate as of September 02, 2022  9:36 AM. If you have any questions, ask your nurse or doctor.          amLODipine 5 MG tablet Commonly known as: NORVASC Take 5 mg by mouth daily.   benazepril 10 MG tablet Commonly known as: LOTENSIN Take 10 mg by mouth 2 (two) times daily.   calcium carbonate 600 MG Tabs tablet Commonly known as: OS-CAL Take 600 mg by mouth daily.   esomeprazole 10 MG packet Commonly known as: NEXIUM Take by mouth.   FISH OIL + D3 PO Take 1,200 mg by mouth daily.   Ginkgo Biloba 40 MG Tabs Take 120 mg by mouth daily.   ibuprofen 200 MG tablet Commonly known as: ADVIL Take 200-400 mg by mouth every 4 (four) hours as needed for mild pain or moderate pain.   Januvia 25 MG tablet Generic drug: sitaGLIPtin Take 25 mg by mouth daily.   levothyroxine 25 MCG tablet Commonly known as: SYNTHROID Take 25 mcg by mouth every morning.   Lysine 1000 MG Tabs Take 1,000 mg by mouth daily.   multivitamin with minerals tablet Take 1 tablet by mouth daily. Centrum   omeprazole 20 MG capsule Commonly known as: PRILOSEC Take 20 mg by mouth 2 (two) times daily.   omeprazole 20 MG capsule Commonly known as: PRILOSEC Take 1 capsule by mouth 2 (two) times daily.   polyethylene glycol 17 g packet Commonly known as: MIRALAX / GLYCOLAX Take 17 g by mouth every Monday, Wednesday, and Friday.   tamsulosin 0.4 MG Caps capsule Commonly known as: FLOMAX TAKE 1 CAPSULE BY MOUTH EVERYDAY AT BEDTIME   Vitamin D3 125 MCG (5000 UT) Tabs Take 5,000 Units by mouth daily.        Allergies:  Allergies  Allergen Reactions   Other Nausea And  Vomiting    Darvocet   Oxycodone Nausea And Vomiting   Propoxyphene Nausea And Vomiting    Family History: Family History  Problem Relation Age of Onset   Hypertension Mother    Cancer Maternal Grandfather    Cancer Brother    Prostate cancer Brother    Kidney disease Neg Hx    Bladder Cancer Neg Hx  Social History:  reports that he has never smoked. He has never used smokeless tobacco. He reports that he does not drink alcohol and does not use drugs.   Physical Exam: BP (!) 153/60   Pulse (!) 49   Ht _0  (1.727 m)   Wt 194 lb (88 kg)   BMI 29.50 kg/m   Constitutional:  Well nourished. Alert and oriented, No acute distress. HEENT: [place] AT, moist mucus membranes.  Trachea midline Cardiovascular: No clubbing, cyanosis, or edema. Respiratory: Normal respiratory effort, no increased work of breathing. Neurologic: Grossly intact, no focal deficits, moving all 4 extremities. Psychiatric: Normal mood and affect.   Laboratory Data:   Urinalysis Color Colorless, Straw, Light Yellow, Yellow, Dark Yellow Light Yellow   Clarity Clear Clear   Specific Gravity 1.005 - 1.030 1.014   pH, Urine 5.0 - 8.0 6.5   Protein, Urinalysis Negative mg/dL Negative   Glucose, Urinalysis Negative mg/dL Negative   Ketones, Urinalysis Negative mg/dL Negative   Blood, Urinalysis Negative Negative   Nitrite, Urinalysis Negative Negative   Leukocyte Esterase, Urinalysis Negative Negative   Bilirubin, Urinalysis Negative Negative   Urobilinogen, Urinalysis 0.2 - 1.0 mg/dL 0.2   WBC, UA <=5 /hpf 0   Red Blood Cells, Urinalysis <=3 /hpf <1   Bacteria, Urinalysis 0 - 5 /hpf 0-5   Squamous Epithelial Cells, Urinalysis /hpf 0   Resulting Agency  [facility] - LAB   Specimen Collected: 08/12/22 07:22   Performed by: [place]: 08/12/22 09:40  Received From: [facility]  Result Received: 08/25/22 08:14   Glucose 70 - 110 mg/dL 145 High     Sodium 136 - 145 mmol/L 137   Potassium 3.6 - 5.1 mmol/L 4.9   Chloride 97 - 109 mmol/L 108   Carbon Dioxide (CO2) 22.0 - 32.0 mmol/L 23.3   Urea Nitrogen (BUN) 7 - 25 mg/dL 35 High    Creatinine 0.7 - 1.3 mg/dL 1.6 High    Glomerular Filtration Rate (eGFR), MDRD Estimate >60 mL/min/1.73sq m 41 Low    Calcium 8.7 - 10.3 mg/dL 10.0   AST  8 - 39 U/L 13   ALT  6 - 57 U/L 31   Alk Phos (alkaline Phosphatase) 34 - 104 U/L 74   Albumin 3.5 - 4.8 g/dL 4.1   Bilirubin, Total 0.3 - 1.2 mg/dL 0.5   Protein, Total 6.1 - 7.9 g/dL 6.4   A/G Ratio 1.0 - 5.0 gm/dL 1.8   Resulting Agency  [facility] - LAB   Specimen Collected: 08/12/22 07:22   Performed by: [facility] - LAB Last Resulted: 08/12/22 11:09  Received From: [facility]  Result Received: 08/25/22 08:14   WBC (White Blood Cell Count) 4.1 - 10.2 10^3/uL 4.4   RBC (Red Blood Cell Count) 4.69 - 6.13 10^6/uL 4.06 Low    Hemoglobin 14.1 - 18.1 gm/dL 12.0 Low    Hematocrit 40.0 - 52.0 % 34.9 Low    MCV (Mean Corpuscular Volume) 80.0 - 100.0 fl 86.0   MCH (Mean Corpuscular Hemoglobin) 27.0 - 31.2 pg 29.6   MCHC (Mean Corpuscular Hemoglobin Concentration) 32.0 - 36.0 gm/dL 34.4   Platelet Count 150 - 450 10^3/uL 188   RDW-CV (Red Cell Distribution Width) 11.6 - 14.8 % 12.6   MPV (Mean Platelet Volume) 9.4 - 12.4 fl 10.6   Neutrophils 1.50 - 7.80 10^3/uL 2.42   Lymphocytes 1.00 - 3.60 10^3/uL 1.39  Monocytes 0.00 - 1.50 10^3/uL 0.42   Eosinophils 0.00 - 0.55 10^3/uL 0.13   Basophils 0.00 - 0.09 10^3/uL 0.03   Neutrophil % 32.0 - 70.0 % 55.0   Lymphocyte % 10.0 - 50.0 % 31.7   Monocyte % 4.0 - 13.0 % 9.6   Eosinophil % 1.0 - 5.0 % 3.0   Basophil% 0.0 - 2.0 % 0.7   Immature Granulocyte % <=0.7 % 0.0   Immature Granulocyte Count <=0.06 10^3/L 0.00   Resulting Agency  [facility] - LAB   Specimen Collected: 08/12/22 07:22   Performed by: [facility]: 08/12/22  08:05  Received From: [facility]  Result Received: 08/25/22 08:14   Hemoglobin A1C 4.2 - 5.6 % 7.1 High    Average Blood Glucose (Calc) mg/dL [facility] - LAB  Narrative Performed by [facility] - LAB Normal Range:    4.2 - 5.6%  Increased Risk:  5.7 - 6.4%  Diabetes:        >= 6.5%  Glycemic Control for adults with diabetes:  <7%    Specimen Collected: 08/12/22 07:22   Performed by: [facility]: 08/12/22 09:35  Received From: [facility]  Result Received: 08/25/22 08:14   Color Colorless, Straw, Light Yellow, Yellow, Dark Yellow Light Yellow   Clarity Clear Clear   Specific Gravity 1.005 - 1.030 1.014   pH, Urine 5.0 - 8.0 6.5   Protein, Urinalysis Negative mg/dL Negative   Glucose, Urinalysis Negative mg/dL Negative   Ketones, Urinalysis Negative mg/dL Negative   Blood, Urinalysis Negative Negative   Nitrite, Urinalysis Negative Negative   Leukocyte Esterase, Urinalysis Negative Negative   Bilirubin, Urinalysis Negative Negative   Urobilinogen, Urinalysis 0.2 - 1.0 mg/dL 0.2   WBC, UA <=5 /hpf 0   Red Blood Cells, Urinalysis <=3 /hpf <1   Bacteria, Urinalysis 0 - 5 /hpf 0-5   Squamous Epithelial Cells, Urinalysis /hpf 0   Resulting Agency  [facility] - LAB   Specimen Collected: 08/12/22 07:22   Performed by: [place]: 08/12/22 09:40  Received From: [facility]  Result Received: 08/25/22 08:14  I have reviewed the labs.   Pertinent Imaging: N/A     I have independently reviewed the films.  See HPI.    Assessment & Plan:    Prostate cancer - PSA-undetectable  Nephrolithiasis  - Asymptomatic -continue tamsulosin 0.4 mg daily    High risk hematuria  - work up 2014 - neg - no reports of gross heme - UA negative for microscopic hematuria   Return in about 1 year (around 09/03/2023) for PSA  .  [facility] [address], [place], [place] [address] [phone]

## 2022-09-02 ENCOUNTER — Ambulatory Visit (INDEPENDENT_AMBULATORY_CARE_PROVIDER_SITE_OTHER): Payer: Medicare Other | Admitting: Urology

## 2022-09-02 ENCOUNTER — Encounter: Payer: Self-pay | Admitting: Urology

## 2022-09-02 VITALS — BP 153/60 | HR 49 | Ht 68.0 in | Wt 194.0 lb

## 2022-09-02 DIAGNOSIS — C61 Malignant neoplasm of prostate: Secondary | ICD-10-CM

## 2022-09-02 DIAGNOSIS — Z87442 Personal history of urinary calculi: Secondary | ICD-10-CM | POA: Diagnosis not present

## 2022-09-02 DIAGNOSIS — N200 Calculus of kidney: Secondary | ICD-10-CM

## 2022-09-02 DIAGNOSIS — R319 Hematuria, unspecified: Secondary | ICD-10-CM

## 2023-04-04 ENCOUNTER — Other Ambulatory Visit: Payer: Self-pay | Admitting: Nephrology

## 2023-04-04 DIAGNOSIS — N1832 Chronic kidney disease, stage 3b: Secondary | ICD-10-CM

## 2023-04-04 DIAGNOSIS — D631 Anemia in chronic kidney disease: Secondary | ICD-10-CM

## 2023-04-04 DIAGNOSIS — R8290 Unspecified abnormal findings in urine: Secondary | ICD-10-CM

## 2023-04-04 DIAGNOSIS — I10 Essential (primary) hypertension: Secondary | ICD-10-CM

## 2023-04-11 ENCOUNTER — Ambulatory Visit
Admission: RE | Admit: 2023-04-11 | Discharge: 2023-04-11 | Disposition: A | Discharge status: Home / Self Care | Payer: Medicare Other | Source: Ambulatory Visit | Attending: Nephrology | Admitting: Nephrology

## 2023-04-11 DIAGNOSIS — D631 Anemia in chronic kidney disease: Secondary | ICD-10-CM | POA: Diagnosis present

## 2023-04-11 DIAGNOSIS — E1122 Type 2 diabetes mellitus with diabetic chronic kidney disease: Secondary | ICD-10-CM | POA: Insufficient documentation

## 2023-04-11 DIAGNOSIS — R8290 Unspecified abnormal findings in urine: Secondary | ICD-10-CM | POA: Insufficient documentation

## 2023-04-11 DIAGNOSIS — N1832 Chronic kidney disease, stage 3b: Secondary | ICD-10-CM | POA: Insufficient documentation

## 2023-04-11 DIAGNOSIS — I10 Essential (primary) hypertension: Secondary | ICD-10-CM | POA: Diagnosis present

## 2023-08-30 ENCOUNTER — Other Ambulatory Visit: Payer: Self-pay

## 2023-08-30 DIAGNOSIS — C61 Malignant neoplasm of prostate: Secondary | ICD-10-CM

## 2023-08-31 ENCOUNTER — Other Ambulatory Visit: Payer: Medicare Other

## 2023-08-31 DIAGNOSIS — C61 Malignant neoplasm of prostate: Secondary | ICD-10-CM

## 2023-09-01 LAB — PSA: Prostate Specific Ag, Serum: <0.1 ng/mL (ref 0.0–4.0)

## 2023-09-04 NOTE — Progress Notes (Unsigned)
09/05/23 9:20 AM   Fabio Pierce Sep 11, 1936 [id number]  Referring provider:  Gracelyn Nurse, MD [address] [place],  [place] [address]  Urological history  1. cT2a Prostate cancer - PSA (08/2023) <0.1 - Dx in 10/2014 with Gleason 3+4, 3+3 prostate cancer involving 8 of 13 cores, iPSA 4.8 DRE also somewhat concerning for cancer with questionable induration at right base. TRUS vol 45 cc - underwent I-125 brachytherapy seed placement on 01/26/2015  - received adjunctive ADT therapy for 4-6 months   2. Nephrolithiasis - stone composition 30% calcium oxalate monohydrate and 70% uric acid - 24 hour metabolic work up - low urine volume, high urine pH, increased uric acid secretion and increased uric acid stone risk - CT on 02/09/2021 revealed small left renal calculi with no evidence of ureteral calculi, hydronephrosis, or other acute findings. - RUS (03/2023) - no hydro   3. High risk hematuria -non-smoker -work up completed 2014 - CTU, cysto in OR - NED -no reports of gross heme -UA (08/2023) negative for micro heme  4. ED -contributing factors of age, prostate cancer, pelvic radiation, HTN, diabetes and HLD -failed PDE5i's -not interested in further treatment  Chief Complaint  Patient presents with   Follow-up     HPI: Alex Lang is a 87 y.o.male with a history of prostate cancer, nephrolithiasis, high risk hematuria, and ED,  who presents today for follow-up.  Previous records reviewed  I PSS 0/0  Patient denies any modifying or aggravating factors.  Patient denies any recent UTI's, gross hematuria, dysuria or suprapubic/flank pain.  Patient denies any fevers, chills, nausea or vomiting.     IPSS     Row Name 09/05/23 0800         International Prostate Symptom Score   How often have you had the sensation of not emptying your bladder? Not at All     How often have you had to urinate less than every two hours? Not at All     How often have  you found you stopped and started again several times when you urinated? Not at All     How often have you found it difficult to postpone urination? Not at All     How often have you had a weak urinary stream? Not at All     How often have you had to strain to start urination? Not at All     How many times did you typically get up at night to urinate? None     Total IPSS Score 0       Quality of Life due to urinary symptoms   If you were to spend the rest of your life with your urinary condition just the way it is now how would you feel about that? Delighted               Score:  1-7 Mild 8-19 Moderate 20-35 Severe   PMH: Past Medical History:  Diagnosis Date   Acid reflux    BP (high blood pressure) 10/02/2014   BPH (benign prostatic hyperplasia)    Elevated PSA    Erectile dysfunction    GERD (gastroesophageal reflux disease)    Gross hematuria    History of hiatal hernia    History of kidney stones    Hypertension    Hypogonadism in male    Kidney stone    bilateral   Kidney stones 06/21/2015   Microscopic hematuria    Nephrolithiasis 2016  Pneumonia    PONV (postoperative nausea and vomiting)    Pre-diabetes    Prostate cancer (HCC)    Renal stones 08/11/2015    Surgical History: Past Surgical History:  Procedure Laterality Date   EXTRACORPOREAL SHOCK WAVE LITHOTRIPSY Right 07/23/2015   Procedure: EXTRACORPOREAL SHOCK WAVE LITHOTRIPSY (ESWL);  Surgeon: Vanna Scotland, MD;  Location: [facility] ORS;  Service: Urology;  Laterality: Right;   EXTRACORPOREAL SHOCK WAVE LITHOTRIPSY Left 09/03/2015   Procedure: EXTRACORPOREAL SHOCK WAVE LITHOTRIPSY (ESWL);  Surgeon: Hildred Laser, MD;  Location: [facility] ORS;  Service: Urology;  Laterality: Left;   extracorporeal shockwave lithotripsy  2016   KIDNEY STONE SURGERY     laser lithotripsy and stent placement  2005   ORTHOPEDIC SURGERY Left    torn meniscus   PROSTATE SURGERY     seeds   XI ROBOTIC ASSISTED INGUINAL HERNIA  REPAIR WITH MESH Bilateral 02/05/2020   Procedure: XI ROBOTIC ASSISTED INGUINAL HERNIA REPAIR WITH MESH;  Surgeon: Carolan Shiver, MD;  Location: [facility] ORS;  Service: General;  Laterality: Bilateral;    Home Medications:  Allergies as of 09/05/2023       Reactions   Amoxicillin Other (See Comments)   Mouth swollen up and white spots inside mouth and throat   Other Nausea And Vomiting   Darvocet   Oxycodone Nausea And Vomiting   Propoxyphene Nausea And Vomiting        Medication List        Accurate as of September 05, 2023  9:20 AM. If you have any questions, ask your nurse or doctor.          STOP taking these medications    esomeprazole 10 MG packet Commonly known as: NEXIUM Stopped by: Michiel Cowboy   levothyroxine 25 MCG tablet Commonly known as: SYNTHROID Stopped by: Markel Mergenthaler       TAKE these medications    amLODipine 5 MG tablet Commonly known as: NORVASC Take 5 mg by mouth daily.   benazepril 10 MG tablet Commonly known as: LOTENSIN Take 10 mg by mouth 2 (two) times daily.   calcium carbonate 600 MG Tabs tablet Commonly known as: OS-CAL Take 600 mg by mouth daily.   FISH OIL + D3 PO Take 1,200 mg by mouth daily.   Ginkgo Biloba 40 MG Tabs Take 120 mg by mouth daily.   ibuprofen 200 MG tablet Commonly known as: ADVIL Take 200-400 mg by mouth every 4 (four) hours as needed for mild pain or moderate pain.   Januvia 25 MG tablet Generic drug: sitaGLIPtin Take 25 mg by mouth daily.   Lysine 1000 MG Tabs Take 1,000 mg by mouth daily.   multivitamin with minerals tablet Take 1 tablet by mouth daily. Centrum   omeprazole 20 MG capsule Commonly known as: PRILOSEC Take 20 mg by mouth 2 (two) times daily.   omeprazole 20 MG capsule Commonly known as: PRILOSEC Take 1 capsule by mouth 2 (two) times daily.   polyethylene glycol 17 g packet Commonly known as: MIRALAX / GLYCOLAX Take 17 g by mouth every Monday, Wednesday, and  Friday.   tamsulosin 0.4 MG Caps capsule Commonly known as: FLOMAX Take 1 capsule (0.4 mg total) by mouth daily. What changed: See the new instructions. Changed by: Michiel Cowboy   traZODone 50 MG tablet Commonly known as: DESYREL Take 1 tablet by mouth at bedtime.   Vitamin D3 125 MCG (5000 UT) Tabs Take 5,000 Units by mouth daily.  Allergies:  Allergies  Allergen Reactions   Amoxicillin Other (See Comments)    Mouth swollen up and white spots inside mouth and throat   Other Nausea And Vomiting    Darvocet   Oxycodone Nausea And Vomiting   Propoxyphene Nausea And Vomiting    Family History: Family History  Problem Relation Age of Onset   Hypertension Mother    Cancer Maternal Grandfather    Cancer Brother    Prostate cancer Brother    Kidney disease Neg Hx    Bladder Cancer Neg Hx     Social History:  reports that he has never smoked. He has never used smokeless tobacco. He reports that he does not drink alcohol and does not use drugs.   Physical Exam: BP (!) 141/55   Pulse (!) 55   Constitutional:  Well nourished. Alert and oriented, No acute distress. HEENT: [place] AT, moist mucus membranes.  Trachea midline Cardiovascular: No clubbing, cyanosis, or edema. Respiratory: Normal respiratory effort, no increased work of breathing. Neurologic: Grossly intact, no focal deficits, moving all 4 extremities. Psychiatric: Normal mood and affect.   Laboratory Data: Results for orders placed or performed in visit on 08/31/23  PSA  Result Value Ref Range   Prostate Specific Ag, Serum <0.1 0.0 - 4.0 ng/mL    Comprehensive Metabolic Panel (CMP) Order: [id number] Component Ref Range & Units 10 d ago  Glucose 70 - 110 mg/dL 119 High   Sodium 147 - 145 mmol/L 138  Potassium 3.6 - 5.1 mmol/L 5.2 High   Chloride 97 - 109 mmol/L 108  Carbon Dioxide (CO2) 22.0 - 32.0 mmol/L 25.9  Urea Nitrogen (BUN) 7 - 25 mg/dL 35 High   Creatinine 0.7 - 1.3 mg/dL 1.3   Glomerular Filtration Rate (eGFR) >60 mL/min/1.73sq m 54 Low   Comment: CKD-EPI (2021) does not include patient's race in the calculation of eGFR.  Monitoring changes of plasma creatinine and eGFR over time is useful for monitoring kidney function.  Interpretive Ranges for eGFR (CKD-EPI 2021):  eGFR:       >60 mL/min/1.73 sq. m - Normal eGFR:       30-59 mL/min/1.73 sq. m - Moderately Decreased eGFR:       15-29 mL/min/1.73 sq. m  - Severely Decreased eGFR:       < 15 mL/min/1.73 sq. m  - Kidney Failure   Note: These eGFR calculations do not apply in acute situations when eGFR is changing rapidly or patients on dialysis.  Calcium 8.7 - 10.3 mg/dL 82.9  AST 8 - 39 U/L 12  ALT 6 - 57 U/L 32  Alk Phos (alkaline Phosphatase) 34 - 104 U/L 85  Albumin 3.5 - 4.8 g/dL 4.1  Bilirubin, Total 0.3 - 1.2 mg/dL 0.5  Protein, Total 6.1 - 7.9 g/dL 6.6  A/G Ratio 1.0 - 5.0 gm/dL 1.6  Resulting Agency [facility]   Specimen Collected: 08/25/23 08:10   Performed by: [facility] Last Resulted: 08/25/23 14:33  Received From: [facility]  Result Received: 08/30/23 11:07     Contains abnormal data CBC w/auto Differential (5 Part) Order: [id number] Component Ref Range & Units 10 d ago  WBC (White Blood Cell Count) 4.1 - 10.2 10^3/uL 4.0 Low   RBC (Red Blood Cell Count) 4.69 - 6.13 10^6/uL 3.86 Low   Hemoglobin 14.1 - 18.1 gm/dL 78.4 Low   Hematocrit 69.6 - 52.0 % 34.1 Low   MCV (Mean Corpuscular Volume) 80.0 -  100.0 fl 88.3  MCH (Mean Corpuscular Hemoglobin) 27.0 - 31.2 pg 30.8  MCHC (Mean Corpuscular Hemoglobin Concentration) 32.0 - 36.0 gm/dL 25.3  Platelet Count 664 - 450 10^3/uL 187  RDW-CV (Red Cell Distribution Width) 11.6 - 14.8 % 12.8  MPV (Mean Platelet Volume) 9.4 - 12.4 fl 10.5  Neutrophils 1.50 - 7.80 10^3/uL 2.13  Lymphocytes 1.00 - 3.60 10^3/uL 1.30  Monocytes 0.00 - 1.50 10^3/uL 0.43  Eosinophils 0.00 - 0.55  10^3/uL 0.10  Basophils 0.00 - 0.09 10^3/uL 0.02  Neutrophil % 32.0 - 70.0 % 53.3  Lymphocyte % 10.0 - 50.0 % 32.6  Monocyte % 4.0 - 13.0 % 10.8  Eosinophil % 1.0 - 5.0 % 2.5  Basophil% 0.0 - 2.0 % 0.5  Immature Granulocyte % <=0.7 % 0.3  Immature Granulocyte Count <=0.06 10^3/L 0.01  Resulting Agency [facility]   Specimen Collected: 08/25/23 08:10   Performed by: [facility] Last Resulted: 08/25/23 09:33  Received From: [facility]  Result Received: 08/30/23 11:07    Hemoglobin A1C Order: [id number] Component Ref Range & Units 10 d ago  Hemoglobin A1C 4.2 - 5.6 % 6.6 High   Average Blood Glucose (Calc) mg/dL 563  Resulting Agency [facility]  Narrative Performed by [facility] Normal Range:    4.2 - 5.6% Increased Risk:  5.7 - 6.4% Diabetes:        >= 6.5% Glycemic Control for adults with diabetes:  <7%    Specimen Collected: 08/25/23 08:10   Performed by: [facility] Last Resulted: 08/25/23 11:09  Received From: [facility]  Result Received: 08/30/23 11:07    Lipid Panel w/calc LDL Order: [id number] Component Ref Range & Units 10 d ago  Cholesterol, Total 100 - 200 mg/dL 518  Triglyceride 35 - 199 mg/dL 841  HDL (High Density Lipoprotein) Cholesterol 29.0 - 71.0 mg/dL 66.0  LDL Calculated 0 - 130 mg/dL 630  VLDL Cholesterol mg/dL 24  Cholesterol/HDL Ratio 5.3  Resulting Agency [facility]   Specimen Collected: 08/25/23 08:10   Performed by: [facility] Last Resulted: 08/25/23 14:39  Received From: [facility]  Result Received: 08/30/23 11:07    Thyroid Stimulating-Hormone (TSH) Order: [id number] Component Ref Range & Units 10 d ago  Thyroid Stimulating Hormone (TSH) 0.450-5.330 uIU/ml uIU/mL 3.997  Resulting Agency [facility]   Specimen Collected: 08/25/23 08:10    Performed by: [facility] Last Resulted: 08/25/23 14:41  Received From: [facility]  Result Received: 08/30/23 11:07     Urinalysis Urinalysis w/Microscopic Order: [id number] Component Ref Range & Units 10 d ago  Color Colorless, Straw, Light Yellow, Yellow, Dark Yellow Light Yellow  Clarity Clear Clear  Specific Gravity 1.005 - 1.030 1.016  pH, Urine 5.0 - 8.0 6.0  Protein, Urinalysis Negative mg/dL Negative  Glucose, Urinalysis Negative mg/dL Negative  Ketones, Urinalysis Negative mg/dL Negative  Blood, Urinalysis Negative Negative  Nitrite, Urinalysis Negative Negative  Leukocyte Esterase, Urinalysis Negative Negative  Bilirubin, Urinalysis Negative Negative  Urobilinogen, Urinalysis 0.2 - 1.0 mg/dL 0.2  WBC, UA <=5 /hpf <1  Red Blood Cells, Urinalysis <=3 /hpf 1  Bacteria, Urinalysis 0 - 5 /hpf 0-5  Squamous Epithelial Cells, Urinalysis /hpf 0  Resulting Agency [facility]   Specimen Collected: 08/25/23 08:10   Performed by: [facility] Last Resulted: 08/25/23 08:50  Received From: [facility]  Result Received: 08/30/23 11:07    I have reviewed the labs.   Pertinent Imaging: CLINICAL DATA:  Abnormal urine   EXAM: RENAL / URINARY TRACT ULTRASOUND COMPLETE   COMPARISON:  CT abdomen pelvis without IV contrast 222   FINDINGS: Right Kidney:   Renal measurements: 11.3 cm = volume: 126 mL. Echogenicity within normal limits. No mass or hydronephrosis visualized. 1.8 cm simple cyst lower pole does not require dedicated imaging follow-up.   Left Kidney:   Renal measurements: 10.9 cm = volume: 110 mL. Echogenicity within normal limits. No mass or hydronephrosis visualized. Multiple simple cyst present in the lower pole measuring up to 3.7 cm. These do not require dedicated imaging follow-up.   Bladder:   Appears normal for degree of bladder distention.   Other:    None.   IMPRESSION: No significant sonographic abnormality of the kidneys.     Electronically Signed   By: Acquanetta Belling M.D.   On: 04/11/2023 14:32  I have independently reviewed the films.  See HPI.    Assessment & Plan:    Prostate cancer - PSA-undetectable  Nephrolithiasis  - Asymptomatic -no stones seen on recent RUS  -continue tamsulosin 0.4 mg daily    High risk hematuria  - work up 2014 - neg - no reports of gross heme - UA negative for microscopic hematuria   Return in about 1 year (around 09/04/2024) for I PSS .  Cloretta Ned   [facility] [address] [place], [place] [address] [phone]

## 2023-09-05 ENCOUNTER — Ambulatory Visit (INDEPENDENT_AMBULATORY_CARE_PROVIDER_SITE_OTHER): Payer: Medicare Other | Admitting: Urology

## 2023-09-05 ENCOUNTER — Encounter: Payer: Self-pay | Admitting: Urology

## 2023-09-05 VITALS — BP 141/55 | HR 55

## 2023-09-05 DIAGNOSIS — N200 Calculus of kidney: Secondary | ICD-10-CM

## 2023-09-05 DIAGNOSIS — C61 Malignant neoplasm of prostate: Secondary | ICD-10-CM

## 2023-09-05 DIAGNOSIS — Z87442 Personal history of urinary calculi: Secondary | ICD-10-CM

## 2023-09-05 DIAGNOSIS — R319 Hematuria, unspecified: Secondary | ICD-10-CM

## 2023-09-05 DIAGNOSIS — Z8546 Personal history of malignant neoplasm of prostate: Secondary | ICD-10-CM

## 2023-09-05 MED ORDER — TAMSULOSIN HCL 0.4 MG PO CAPS
0.4000 mg | ORAL_CAPSULE | Freq: Every day | ORAL | 3 refills | Status: DC
Start: 2023-09-05 — End: 2024-08-22

## 2024-07-22 ENCOUNTER — Encounter: Payer: Self-pay | Admitting: Urology

## 2024-08-22 ENCOUNTER — Other Ambulatory Visit: Payer: Self-pay | Admitting: Urology

## 2024-08-22 DIAGNOSIS — N200 Calculus of kidney: Secondary | ICD-10-CM

## 2024-09-02 ENCOUNTER — Other Ambulatory Visit: Payer: Self-pay

## 2024-09-02 DIAGNOSIS — C61 Malignant neoplasm of prostate: Secondary | ICD-10-CM

## 2024-09-03 ENCOUNTER — Ambulatory Visit: Payer: Self-pay | Admitting: Urology

## 2024-09-03 ENCOUNTER — Other Ambulatory Visit

## 2024-09-03 DIAGNOSIS — C61 Malignant neoplasm of prostate: Secondary | ICD-10-CM

## 2024-09-04 LAB — PSA: Prostate Specific Ag, Serum: <0.1 ng/mL (ref 0.0–4.0)

## 2024-09-09 NOTE — Progress Notes (Unsigned)
 09/11/24 10:00 AM   Alex Lang 04-29-1936 [id number]  Referring provider:  Rudolpho Norleen BIRCH, MD [address] [facility] [address] [facility] [place],  [place] [address]  Urological history  1. cT2a Prostate cancer - PSA (08/2024) <0.1 - Dx in 10/2014 with Gleason 3+4, 3+3 prostate cancer involving 8 of 13 cores, iPSA 4.8 DRE also somewhat concerning for cancer with questionable induration at right base. TRUS vol 45 cc - underwent I-125 brachytherapy seed placement on 01/26/2015  - received adjunctive ADT therapy for 4-6 months   2. Nephrolithiasis - stone composition 30% calcium oxalate monohydrate and 70% uric acid - 24 hour metabolic work up - low urine volume, high urine pH, increased uric acid secretion and increased uric acid stone risk - CT on 02/09/2021 revealed small left renal calculi with no evidence of ureteral calculi, hydronephrosis, or other acute findings. - RUS (03/2023) - no hydro   3. High risk hematuria -non-smoker -work up completed 2014 - CTU, cysto in OR - NED  4. ED -contributing factors of age, prostate cancer, pelvic radiation, HTN, diabetes and HLD -failed PDE5i's -not interested in further treatment  Chief Complaint  Patient presents with   Prostate cancer [facility])    HPI: Alex Lang is a 89 y.o.male with a history of prostate cancer, nephrolithiasis, high risk hematuria, and ED,  who presents today for follow-up with his wife, Alex Lang.    Previous records reviewed  I PSS 1/0   He is not having any nocturia, he is voiding with a fairly strong stream and feels he is emptying his bladder.  He does not feel he goes excessively.  Patient denies any modifying or aggravating factors.  Patient denies any recent UTI's, gross hematuria, dysuria or suprapubic/flank pain.  Patient denies any fevers, chills, nausea or vomiting.    UA (07/2024) clear  PSA (08/2024) <0.1  Serum creatinine (07/2024) 1.45, eGFR 47  Hemoglobin A1c (02/2024) 6.6     PMH: Past Medical History:  Diagnosis Date   Acid reflux    BP (high blood pressure) 10/02/2014   BPH (benign prostatic hyperplasia)    Elevated PSA    Erectile dysfunction    GERD (gastroesophageal reflux disease)    Gross hematuria    History of hiatal hernia    History of kidney stones    Hypertension    Hypogonadism in male    Kidney stone    bilateral   Kidney stones 06/21/2015   Microscopic hematuria    Nephrolithiasis 2016   Pneumonia    PONV (postoperative nausea and vomiting)    Pre-diabetes    Prostate cancer (HCC)    Renal stones 08/11/2015    Surgical History: Past Surgical History:  Procedure Laterality Date   EXTRACORPOREAL SHOCK WAVE LITHOTRIPSY Right 07/23/2015   Procedure: EXTRACORPOREAL SHOCK WAVE LITHOTRIPSY (ESWL);  Surgeon: Rosina Riis, MD;  Location: [facility];  Service: Urology;  Laterality: Right;   EXTRACORPOREAL SHOCK WAVE LITHOTRIPSY Left 09/03/2015   Procedure: EXTRACORPOREAL SHOCK WAVE LITHOTRIPSY (ESWL);  Surgeon: Redell Lynwood Napoleon, MD;  Location: [facility];  Service: Urology;  Laterality: Left;   extracorporeal shockwave lithotripsy  2016   KIDNEY STONE SURGERY     laser lithotripsy and stent placement  2005   ORTHOPEDIC SURGERY Left    torn meniscus   PROSTATE SURGERY     seeds   XI ROBOTIC ASSISTED INGUINAL HERNIA REPAIR WITH MESH Bilateral 02/05/2020   Procedure: XI ROBOTIC ASSISTED INGUINAL HERNIA REPAIR WITH MESH;  Surgeon: Rodolph,  Lucas, MD;  Location: [facility] OR[facility];  Service: General;  Laterality: Bilateral;    Home Medications:  Allergies as of 09/11/2024       Reactions   Amoxicillin Other (See Comments)   Mouth swollen up and white spots inside mouth and throat   Other Nausea And Vomiting   Darvocet   Oxycodone Nausea And Vomiting   Propoxyphene Nausea And Vomiting        Medication List        Accurate as of September 11, 2024 10:00 AM. If you have any questions, ask your nurse or doctor.          amLODipine  5 MG tablet Commonly known as: NORVASC Take 5 mg by mouth daily.   benazepril 10 MG tablet Commonly known as: LOTENSIN Take 10 mg by mouth 2 (two) times daily.   calcium carbonate 600 MG Tabs tablet Commonly known as: OS-CAL Take 600 mg by mouth daily.   FISH OIL + D3 PO Take 1,200 mg by mouth daily.   Ginkgo Biloba 40 MG Tabs Take 120 mg by mouth daily.   ibuprofen 200 MG tablet Commonly known as: ADVIL Take 200-400 mg by mouth every 4 (four) hours as needed for mild pain or moderate pain.   Januvia 25 MG tablet Generic drug: sitaGLIPtin Take 25 mg by mouth daily.   Lysine 1000 MG Tabs Take 1,000 mg by mouth daily.   multivitamin with minerals tablet Take 1 tablet by mouth daily. Centrum   omeprazole 20 MG capsule Commonly known as: PRILOSEC Take 20 mg by mouth 2 (two) times daily.   omeprazole 20 MG capsule Commonly known as: PRILOSEC Take 1 capsule by mouth 2 (two) times daily.   polyethylene glycol 17 g packet Commonly known as: MIRALAX / GLYCOLAX Take 17 g by mouth every Monday, Wednesday, and Friday.   tamsulosin  0.4 MG Caps capsule Commonly known as: FLOMAX  TAKE 1 CAPSULE BY MOUTH EVERY DAY   traZODone 50 MG tablet Commonly known as: DESYREL Take 1 tablet by mouth at bedtime.   Vitamin D3 125 MCG (5000 UT) Tabs Take 5,000 Units by mouth daily.        Allergies:  Allergies  Allergen Reactions   Amoxicillin Other (See Comments)    Mouth swollen up and white spots inside mouth and throat   Other Nausea And Vomiting    Darvocet   Oxycodone Nausea And Vomiting   Propoxyphene Nausea And Vomiting    Family History: Family History  Problem Relation Age of Onset   Hypertension Mother    Cancer Maternal Grandfather    Cancer Brother    Prostate cancer Brother    Kidney disease Neg Hx    Bladder Cancer Neg Hx     Social History:  reports that he has never smoked. He has never used smokeless tobacco. He reports that he does not drink alcohol  and does not use drugs.   Physical Exam: Blood pressure (!) 144/61, pulse 78, height 5' 9 (1.753 m), weight 193 lb 9.6 oz (87.8 kg).  Constitutional:  Well nourished. Alert and oriented, No acute distress. HEENT: [place] AT, moist mucus membranes.  Trachea midline Cardiovascular: No clubbing, cyanosis, or edema. Respiratory: Normal respiratory effort, no increased work of breathing. Neurologic: Grossly intact, no focal deficits, moving all 4 extremities. Psychiatric: Normal mood and affect.   Laboratory Data:  See HPI and EPIC  I have reviewed the labs.   Pertinent Imaging: N/A  Assessment & Plan:  Prostate cancer - PSA-undetectable - No bothersome urinary symptoms  Nephrolithiasis  - Asymptomatic - continue tamsulosin  0.4 mg daily    High risk hematuria  - work up 2014 - neg - no reports of gross heme - UA negative for microscopic hematuria   Return in about 1 year (around 09/11/2025) for PSA and symptom recheck .  Alex Lang   [facility] [address] [place], [place] [address] [phone]

## 2024-09-11 ENCOUNTER — Ambulatory Visit (INDEPENDENT_AMBULATORY_CARE_PROVIDER_SITE_OTHER): Admitting: Urology

## 2024-09-11 ENCOUNTER — Encounter: Payer: Self-pay | Admitting: Urology

## 2024-09-11 VITALS — BP 144/61 | HR 78 | Ht 69.0 in | Wt 193.6 lb

## 2024-09-11 DIAGNOSIS — C61 Malignant neoplasm of prostate: Secondary | ICD-10-CM

## 2024-09-11 DIAGNOSIS — N200 Calculus of kidney: Secondary | ICD-10-CM | POA: Diagnosis not present

## 2024-09-11 DIAGNOSIS — R319 Hematuria, unspecified: Secondary | ICD-10-CM | POA: Diagnosis not present

## 2024-09-30 ENCOUNTER — Other Ambulatory Visit: Payer: Self-pay | Admitting: Internal Medicine

## 2024-09-30 DIAGNOSIS — R002 Palpitations: Secondary | ICD-10-CM

## 2024-09-30 DIAGNOSIS — I498 Other specified cardiac arrhythmias: Secondary | ICD-10-CM

## 2024-10-23 ENCOUNTER — Telehealth (HOSPITAL_COMMUNITY): Payer: Self-pay | Admitting: *Deleted

## 2024-10-23 ENCOUNTER — Encounter (HOSPITAL_COMMUNITY): Payer: Self-pay

## 2024-10-23 NOTE — Telephone Encounter (Signed)
Reaching out to patient to offer assistance regarding upcoming cardiac imaging study; pt verbalizes understanding of appt date/time, parking situation and where to check in, pre-test NPO status and medications ordered, and verified current allergies; name and call back number provided for further questions should they arise  Larey Brick RN Navigator Cardiac Imaging [facility] [phone] office [phone] cell  Patient to take 25mg  metoprolol tartrate two hours prior to his cardiac CT scan.

## 2024-10-24 ENCOUNTER — Ambulatory Visit
Admission: RE | Admit: 2024-10-24 | Discharge: 2024-10-24 | Disposition: A | Discharge status: Home / Self Care | Source: Ambulatory Visit | Attending: Cardiology | Admitting: Cardiology

## 2024-10-24 ENCOUNTER — Other Ambulatory Visit: Payer: Self-pay | Admitting: Cardiology

## 2024-10-24 ENCOUNTER — Ambulatory Visit
Admission: RE | Admit: 2024-10-24 | Discharge: 2024-10-24 | Disposition: A | Discharge status: Home / Self Care | Source: Ambulatory Visit | Attending: Internal Medicine | Admitting: Internal Medicine

## 2024-10-24 DIAGNOSIS — R931 Abnormal findings on diagnostic imaging of heart and coronary circulation: Secondary | ICD-10-CM | POA: Insufficient documentation

## 2024-10-24 DIAGNOSIS — R002 Palpitations: Secondary | ICD-10-CM | POA: Diagnosis present

## 2024-10-24 DIAGNOSIS — I25708 Atherosclerosis of coronary artery bypass graft(s), unspecified, with other forms of angina pectoris: Secondary | ICD-10-CM

## 2024-10-24 DIAGNOSIS — I498 Other specified cardiac arrhythmias: Secondary | ICD-10-CM | POA: Insufficient documentation

## 2024-10-24 MED ORDER — IOHEXOL 350 MG/ML SOLN
100.0000 mL | Freq: Once | INTRAVENOUS | Status: AC | PRN
  Administered 2024-10-24: 100 mL via INTRAVENOUS

## 2024-10-24 MED ORDER — NITROGLYCERIN 0.4 MG SL SUBL
0.8000 mg | SUBLINGUAL_TABLET | Freq: Once | SUBLINGUAL | Status: AC
  Administered 2024-10-24: 0.8 mg via SUBLINGUAL
  Filled 2024-10-24: qty 25

## 2024-10-24 NOTE — Progress Notes (Signed)
 Patient tolerated procedure well. W/C to lobby.  Ambulate w/o difficulty. Denies light headedness or being dizzy. Encouraged to drink extra water today and reasoning explained. Verbalized understanding. All questions answered. ABC intact. No further needs. Discharge from procedure area w/o issues.

## 2025-09-04 ENCOUNTER — Other Ambulatory Visit

## 2025-09-11 ENCOUNTER — Ambulatory Visit: Admitting: Urology
